# Patient Record
Sex: Male | Born: 1960 | Race: Black or African American | Hispanic: No | Marital: Married | Smoking: Never smoker
Health system: Southern US, Community
[De-identification: ages and names within clinical notes are randomized; demographics above are authoritative.]

## PROBLEM LIST (undated history)

## (undated) DIAGNOSIS — T7840XA Allergy, unspecified, initial encounter: Secondary | ICD-10-CM

## (undated) DIAGNOSIS — B1910 Unspecified viral hepatitis B without hepatic coma: Secondary | ICD-10-CM

## (undated) DIAGNOSIS — Z8619 Personal history of other infectious and parasitic diseases: Secondary | ICD-10-CM

## (undated) HISTORY — DX: Allergy, unspecified, initial encounter: T78.40XA

## (undated) HISTORY — DX: Personal history of other infectious and parasitic diseases: Z86.19

---

## 2007-04-12 ENCOUNTER — Ambulatory Visit: Payer: Self-pay | Admitting: Internal Medicine

## 2007-04-12 LAB — CONVERTED CEMR LAB
Albumin: 4.5 g/dL (ref 3.5–5.2)
CO2: 26 meq/L (ref 19–32)
Calcium: 8.8 mg/dL (ref 8.4–10.5)
Glucose, Bld: 89 mg/dL (ref 70–99)
HCT: 44.7 % (ref 39.0–52.0)
Hemoglobin: 14.7 g/dL (ref 13.0–17.0)
Potassium: 4 meq/L (ref 3.5–5.3)
RDW: 14.1 % — ABNORMAL HIGH (ref 11.5–14.0)
Sodium: 143 meq/L (ref 135–145)
Total Protein: 7.6 g/dL (ref 6.0–8.3)
WBC: 4.2 10*3/uL (ref 4.0–10.5)

## 2007-04-18 ENCOUNTER — Encounter (INDEPENDENT_AMBULATORY_CARE_PROVIDER_SITE_OTHER): Payer: Self-pay | Admitting: Internal Medicine

## 2007-05-03 ENCOUNTER — Ambulatory Visit: Payer: Self-pay | Admitting: Internal Medicine

## 2007-11-14 ENCOUNTER — Ambulatory Visit: Payer: Self-pay | Admitting: Internal Medicine

## 2007-11-15 ENCOUNTER — Ambulatory Visit: Payer: Self-pay | Admitting: *Deleted

## 2008-10-03 ENCOUNTER — Ambulatory Visit: Payer: Self-pay | Admitting: Internal Medicine

## 2008-10-03 LAB — CONVERTED CEMR LAB
Potassium: 3.7 meq/L (ref 3.5–5.3)
Sodium: 141 meq/L (ref 135–145)
TSH: 0.853 microintl units/mL (ref 0.350–4.50)

## 2008-10-22 ENCOUNTER — Ambulatory Visit: Payer: Self-pay | Admitting: Internal Medicine

## 2008-10-22 LAB — CONVERTED CEMR LAB
CO2: 24 meq/L (ref 19–32)
Chloride: 105 meq/L (ref 96–112)
HDL: 45 mg/dL (ref >39)
LDL Cholesterol: 66 mg/dL (ref 0–99)
Microalb, Ur: 1.02 mg/dL (ref 0.00–1.89)
Sodium: 140 meq/L (ref 135–145)
Total CHOL/HDL Ratio: 3.4

## 2008-10-30 ENCOUNTER — Ambulatory Visit: Payer: Self-pay | Admitting: Internal Medicine

## 2008-11-07 ENCOUNTER — Ambulatory Visit: Payer: Self-pay | Admitting: Internal Medicine

## 2008-12-10 ENCOUNTER — Ambulatory Visit: Payer: Self-pay | Admitting: Family Medicine

## 2009-01-21 ENCOUNTER — Ambulatory Visit: Payer: Self-pay | Admitting: Internal Medicine

## 2009-04-24 ENCOUNTER — Ambulatory Visit: Payer: Self-pay | Admitting: Internal Medicine

## 2009-04-29 ENCOUNTER — Ambulatory Visit: Payer: Self-pay | Admitting: Internal Medicine

## 2009-05-23 ENCOUNTER — Telehealth (INDEPENDENT_AMBULATORY_CARE_PROVIDER_SITE_OTHER): Payer: Self-pay | Admitting: *Deleted

## 2009-05-27 ENCOUNTER — Ambulatory Visit: Payer: Self-pay | Admitting: Internal Medicine

## 2009-08-28 ENCOUNTER — Ambulatory Visit: Payer: Self-pay | Admitting: Internal Medicine

## 2009-11-26 ENCOUNTER — Ambulatory Visit: Payer: Self-pay | Admitting: Internal Medicine

## 2010-02-03 ENCOUNTER — Emergency Department (HOSPITAL_COMMUNITY): Admission: EM | Admit: 2010-02-03 | Discharge: 2010-02-03 | Payer: Self-pay | Admitting: Emergency Medicine

## 2011-06-18 ENCOUNTER — Emergency Department (HOSPITAL_COMMUNITY)
Admission: EM | Admit: 2011-06-18 | Discharge: 2011-06-19 | Disposition: A | Discharge status: Home / Self Care | Payer: BC Managed Care – PPO | Attending: Emergency Medicine | Admitting: Emergency Medicine

## 2011-06-18 DIAGNOSIS — Y9289 Other specified places as the place of occurrence of the external cause: Secondary | ICD-10-CM | POA: Insufficient documentation

## 2011-06-18 DIAGNOSIS — R071 Chest pain on breathing: Secondary | ICD-10-CM | POA: Insufficient documentation

## 2011-06-18 DIAGNOSIS — M542 Cervicalgia: Secondary | ICD-10-CM | POA: Insufficient documentation

## 2011-06-18 DIAGNOSIS — IMO0002 Reserved for concepts with insufficient information to code with codable children: Secondary | ICD-10-CM

## 2011-06-18 DIAGNOSIS — S0100XA Unspecified open wound of scalp, initial encounter: Secondary | ICD-10-CM | POA: Insufficient documentation

## 2011-06-18 DIAGNOSIS — T148XXA Other injury of unspecified body region, initial encounter: Secondary | ICD-10-CM

## 2011-06-18 DIAGNOSIS — E119 Type 2 diabetes mellitus without complications: Secondary | ICD-10-CM | POA: Insufficient documentation

## 2011-06-18 DIAGNOSIS — S0990XA Unspecified injury of head, initial encounter: Secondary | ICD-10-CM | POA: Insufficient documentation
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
## 2011-06-19 ENCOUNTER — Emergency Department (HOSPITAL_COMMUNITY): Payer: BC Managed Care – PPO

## 2011-06-19 ENCOUNTER — Encounter: Payer: Self-pay | Admitting: Emergency Medicine

## 2011-06-19 MED ORDER — IBUPROFEN 600 MG PO TABS: 600.0000 mg | ORAL_TABLET | Freq: Four times a day (QID) | ORAL | Status: AC | PRN

## 2011-06-19 MED ORDER — TETANUS-DIPHTH-ACELL PERTUSSIS 5-2.5-18.5 LF-MCG/0.5 IM SUSP
0.5000 mL | Freq: Once | INTRAMUSCULAR | Status: AC
  Administered 2011-06-19: 0.5 mL via INTRAMUSCULAR
  Filled 2011-06-19: qty 0.5

## 2011-06-19 MED ORDER — HYDROCODONE-ACETAMINOPHEN 5-325 MG PO TABS
1.0000 | ORAL_TABLET | Freq: Once | ORAL | Status: AC
  Administered 2011-06-19: 1 via ORAL
  Filled 2011-06-19: qty 1

## 2011-06-19 MED ORDER — BACITRACIN 500 UNIT/GM EX OINT
1.0000 "application " | TOPICAL_OINTMENT | Freq: Two times a day (BID) | CUTANEOUS | Status: DC
  Administered 2011-06-19: 1 via TOPICAL
  Filled 2011-06-19 (×3): qty 0.9

## 2011-06-19 MED ORDER — HYDROCODONE-ACETAMINOPHEN 5-500 MG PO TABS: 1.0000 | ORAL_TABLET | Freq: Four times a day (QID) | ORAL | Status: AC | PRN
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
## 2011-06-19 NOTE — ED Notes (Signed)
Patient stable.  Patient has bacitracin zinc applied to lac and dressing applied.  Patient stable upon discharge.  Discharged home to care of wife.

## 2011-06-29 ENCOUNTER — Emergency Department (HOSPITAL_COMMUNITY)
Admission: EM | Admit: 2011-06-29 | Discharge: 2011-06-29 | Disposition: A | Discharge status: Home / Self Care | Payer: BC Managed Care – PPO | Attending: Emergency Medicine | Admitting: Emergency Medicine

## 2011-06-29 ENCOUNTER — Encounter (HOSPITAL_COMMUNITY): Payer: Self-pay | Admitting: *Deleted

## 2011-06-29 DIAGNOSIS — Z4802 Encounter for removal of sutures: Secondary | ICD-10-CM | POA: Insufficient documentation

## 2011-06-29 NOTE — ED Notes (Signed)
Pt has one staple R side head. Placed 10 days ago. No redness, drainage, pain noted.

## 2011-06-29 NOTE — ED Provider Notes (Signed)
History     CSN: [id number] Arrival date & time: 06/29/2011  8:27 AM   None     Chief Complaint  Patient presents with  . Suture / Staple Removal   HPI Patient presents to the emergency room with complaint of needing a staple removed from his head 10 days ago. No signs of infection or other concerns.  Past Medical History  Diagnosis Date  . Diabetes mellitus     History reviewed. No pertinent past surgical history.  No family history on file.  History  Substance Use Topics  . Smoking status: Never Smoker   . Smokeless tobacco: Not on file  . Alcohol Use: No      Review of Systems  All other systems reviewed and are negative.    Allergies  Review of patient's allergies indicates no known allergies.  Home Medications   Current Outpatient Rx  Name Route Sig Dispense Refill  . FOLIC ACID 1 MG PO TABS Oral Take 1 mg by mouth daily.      Marland Kitchen HYDROCODONE-ACETAMINOPHEN 5-500 MG PO TABS Oral Take 1-2 tablets by mouth every 6 (six) hours as needed for pain. 15 tablet 0  . IBUPROFEN 600 MG PO TABS Oral Take 1 tablet (600 mg total) by mouth every 6 (six) hours as needed for pain. 30 tablet 0  . METFORMIN HCL 500 MG PO TABS Oral Take 500 mg by mouth 2 (two) times daily with a meal.      . VITAMIN A 7500 UNITS PO CAPS Oral Take 7,500 Units by mouth daily.      Marland Kitchen VITAMIN C 500 MG PO TABS Oral Take 500 mg by mouth daily.        BP 128/71  Pulse 70  Temp(Src) 98.6 F (37 C) (Oral)  Resp 14  SpO2 97%  Physical Exam  Nursing note and vitals reviewed. Constitutional: He is oriented to person, place, and time. He appears well-developed and well-nourished. No distress.  HENT:  Head: Normocephalic and atraumatic.       Staple to right side of head  Eyes: EOM are normal. Pupils are equal, round, and reactive to light.  Neck: Normal range of motion. Neck supple.  Cardiovascular: Normal rate and regular rhythm.  Exam reveals no gallop and no friction rub.   No murmur  heard. Pulmonary/Chest: Effort normal and breath sounds normal.  Abdominal: Soft. Bowel sounds are normal.  Musculoskeletal: Normal range of motion. He exhibits no edema and no tenderness.  Neurological: He is alert and oriented to person, place, and time. He displays normal reflexes. No cranial nerve deficit. He exhibits normal muscle tone.  Skin: Skin is warm and dry. No rash noted. He is not diaphoretic. No erythema. No pallor.  Psychiatric: He has a normal mood and affect. His behavior is normal. Judgment and thought content normal.    ED Course  Procedures (including critical care time)  Staple removed. No signs of infection. Advised of warning signs to return. Stated agreement and understanding.    MDM  Staple removal.        Demetrius Charity, PA 06/29/11 [phone]

## 2011-06-29 NOTE — ED Provider Notes (Signed)
Medical screening examination/treatment/procedure(s) were performed by non-physician practitioner and as supervising physician I was immediately available for consultation/collaboration.   Glynn Octave, MD 06/29/11 [phone]

## 2013-11-15 ENCOUNTER — Ambulatory Visit (INDEPENDENT_AMBULATORY_CARE_PROVIDER_SITE_OTHER): Payer: BC Managed Care – PPO | Admitting: Family Medicine

## 2013-11-15 ENCOUNTER — Encounter: Payer: Self-pay | Admitting: Family Medicine

## 2013-11-15 VITALS — BP 100/68 | Temp 98.9°F | Ht 70.0 in | Wt 174.0 lb

## 2013-11-15 DIAGNOSIS — Z9109 Other allergy status, other than to drugs and biological substances: Secondary | ICD-10-CM

## 2013-11-15 DIAGNOSIS — Z7189 Other specified counseling: Secondary | ICD-10-CM

## 2013-11-15 DIAGNOSIS — Z7689 Persons encountering health services in other specified circumstances: Secondary | ICD-10-CM

## 2013-11-15 DIAGNOSIS — R05 Cough: Secondary | ICD-10-CM

## 2013-11-15 DIAGNOSIS — L720 Epidermal cyst: Secondary | ICD-10-CM

## 2013-11-15 DIAGNOSIS — R0982 Postnasal drip: Secondary | ICD-10-CM

## 2013-11-15 DIAGNOSIS — B1910 Unspecified viral hepatitis B without hepatic coma: Secondary | ICD-10-CM

## 2013-11-15 DIAGNOSIS — L723 Sebaceous cyst: Secondary | ICD-10-CM

## 2013-11-15 DIAGNOSIS — E119 Type 2 diabetes mellitus without complications: Secondary | ICD-10-CM

## 2013-11-15 DIAGNOSIS — R059 Cough, unspecified: Secondary | ICD-10-CM

## 2013-11-15 MED ORDER — FLUTICASONE PROPIONATE 50 MCG/ACT NA SUSP: 2.0000 | Freq: Every day | NASAL | Status: DC

## 2013-11-15 NOTE — Progress Notes (Signed)
Chief Complaint  Patient presents with  . Establish Care  . Diabetes    bilateral foot pain   . Cough    HPI:  Christian Vance is here to establish care.    Has the following chronic problems and concerns today:  There are no active problems to display for this patient.  DM: -was on metformin in the past but stopped due great control with lifestyle changes -no foot pain or tingling but occ feels cold and he wants to check hgba1c to see if diabetes is ok -denies: polyuria or polydipsia, CP, SOB, abd pain, fatigue  Cough/PND: -hx of possible allergies -no hx of hemoptysis, SOB, fever, sneezing, GERD -tried robitussin, Claritin sometime helps when pollen is bad  Bump on chest: -for about 1 year -not painful, not changing   ROS: See pertinent positives and negatives per HPI.  Past Medical History  Diagnosis Date  . Diabetes mellitus   . History of hepatitis B   . Allergy     History reviewed. No pertinent family history.  History   Social History  . Marital Status: Married    Spouse Name: N/A    Number of Children: N/A  . Years of Education: N/A   Social History Main Topics  . Smoking status: Never Smoker   . Smokeless tobacco: None  . Alcohol Use: No  . Drug Use: No  . Sexual Activity: None   Other Topics Concern  . 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      Spiritual Beliefs:      Lifestyle: no regular exercise; diet is healthy             Current outpatient prescriptions:Multiple Vitamins-Minerals (CENTRUM ULTRA MENS PO), Take by mouth., Disp: , Rfl: ;  fluticasone (FLONASE) 50 MCG/ACT nasal spray, Place 2 sprays into both nostrils daily., Disp: 16 g, Rfl: 6  EXAM:  Filed Vitals:   11/15/13 1615  BP: 100/68  Temp: 98.9 F (37.2 C)    Body mass index is 24.97 kg/(m^2).  GENERAL: vitals reviewed and listed above, alert, oriented, appears well hydrated and in no  acute distress  HEENT: atraumatic, conjunttiva clear, no obvious abnormalities on inspection of external nose and ears, normal appearance of ear canals and TMs, clear nasal congestion, boggy turbinates, mild post oropharyngeal erythema with PND, no tonsillar edema or exudate, no sinus TTP  NECK: no obvious masses on inspection  LUNGS: clear to auscultation bilaterally, no wheezes, rales or rhonchi, good air movement  CV: HRRR, no peripheral edema  SKIN: 1.5 sm mobile subcutaneous nodule L chest with punctate  MS: moves all extremities without noticeable abnormality  PSYCH: pleasant and cooperative, no obvious depression or anxiety  ASSESSMENT AND PLAN:  Discussed the following assessment and plan:  DM (diabetes mellitus) - Plan: Lipid Panel, Hemoglobin A3F, Basic metabolic panel, Microalbumin/Creatinine Ratio, Urine  Hepatitis B - Plan: Hep B Surface Antibody, Hepatitis B core antibody, total, Hep B Surface Antigen  Environmental allergies - Plan: fluticasone (FLONASE) 50 MCG/ACT nasal spray  Cough  PND (post-nasal drip)  Encounter to establish care  Epidermoid cyst   -We reviewed the PMH, PSH, FH, SH, Meds and Allergies. -We provided refills for any medications we will prescribe as needed. -We addressed current concerns per orders and patient instructions. -We have asked for records for pertinent exams, studies, vaccines and notes from previous providers. -We have  advised patient to follow up per instructions below.   -Patient advised to return or notify a doctor immediately if symptoms worsen or persist or new concerns arise.  Patient Instructions  -We have ordered labs or studies at this visit. It can take up to 1-2 weeks for results and processing. We will contact you with instructions IF your results are abnormal. Normal results will be released to your [facility]. If you have not heard from [place] or can not find your results in [facility] in 2 weeks please contact our  office.  -PLEASE SIGN UP FOR MYCHART TODAY   We recommend the following healthy lifestyle measures: - eat a healthy diet consisting of lots of vegetables, fruits, beans, nuts, seeds, healthy meats such as white chicken and fish and whole grains.  - avoid fried foods, fast food, processed foods, sodas, red meet and other fattening foods.  - get a least 150 minutes of aerobic exercise per week.   Flonase daily  Claritin daily  See dermatologist for removal of cyst  Follow up in: 1 month      Lucretia Kern

## 2013-11-15 NOTE — Patient Instructions (Signed)
-  We have ordered labs or studies at this visit. It can take up to 1-2 weeks for results and processing. We will contact you with instructions IF your results are abnormal. Normal results will be released to your [facility]. If you have not heard from [place] or can not find your results in [facility] in 2 weeks please contact our office.  -PLEASE SIGN UP FOR MYCHART TODAY   We recommend the following healthy lifestyle measures: - eat a healthy diet consisting of lots of vegetables, fruits, beans, nuts, seeds, healthy meats such as white chicken and fish and whole grains.  - avoid fried foods, fast food, processed foods, sodas, red meet and other fattening foods.  - get a least 150 minutes of aerobic exercise per week.   Flonase daily  Claritin daily  See dermatologist for removal of cyst  Follow up in: 1 month

## 2013-11-16 LAB — LIPID PANEL
CHOL/HDL RATIO: 2
Cholesterol: 107 mg/dL (ref 0–200)
HDL: 50.3 mg/dL (ref >39.00)
LDL CALC: 39 mg/dL (ref 0–99)
Triglycerides: 87 mg/dL (ref 0.0–149.0)
VLDL: 17.4 mg/dL (ref 0.0–40.0)

## 2013-11-16 LAB — BASIC METABOLIC PANEL
BUN: 10 mg/dL (ref 6–23)
CALCIUM: 9.1 mg/dL (ref 8.4–10.5)
CO2: 29 meq/L (ref 19–32)
CREATININE: 0.8 mg/dL (ref 0.4–1.5)
Chloride: 109 mEq/L (ref 96–112)
GFR: 131 mL/min (ref >60.00)
Glucose, Bld: 122 mg/dL — ABNORMAL HIGH (ref 70–99)
Potassium: 4.1 mEq/L (ref 3.5–5.1)
Sodium: 141 mEq/L (ref 135–145)

## 2013-11-16 LAB — HEPATITIS B CORE ANTIBODY, TOTAL: Hep B Core Total Ab: REACTIVE — AB

## 2013-11-16 LAB — HEPATITIS B SURFACE ANTIGEN: Hepatitis B Surface Ag: POSITIVE — AB

## 2013-11-16 LAB — MICROALBUMIN / CREATININE URINE RATIO
CREATININE, U: 376.3 mg/dL
MICROALB UR: 2.7 mg/dL — AB (ref 0.0–1.9)
Microalb Creat Ratio: 0.7 mg/g (ref 0.0–30.0)

## 2013-11-16 LAB — HEPATITIS B SURFACE ANTIBODY,QUALITATIVE: Hep B S Ab: POSITIVE — AB

## 2013-11-16 LAB — HEMOGLOBIN A1C: HEMOGLOBIN A1C: 7.5 % — AB (ref 4.6–6.5)

## 2013-11-30 ENCOUNTER — Telehealth: Payer: Self-pay

## 2013-11-30 NOTE — Telephone Encounter (Signed)
Relevant patient education mailed to patient.  

## 2013-12-03 ENCOUNTER — Encounter: Payer: Self-pay | Admitting: Family Medicine

## 2013-12-03 ENCOUNTER — Ambulatory Visit (INDEPENDENT_AMBULATORY_CARE_PROVIDER_SITE_OTHER): Payer: BC Managed Care – PPO | Admitting: Family Medicine

## 2013-12-03 VITALS — BP 120/70 | HR 84 | Temp 97.3°F | Ht 70.0 in | Wt 174.2 lb

## 2013-12-03 DIAGNOSIS — E1165 Type 2 diabetes mellitus with hyperglycemia: Principal | ICD-10-CM

## 2013-12-03 DIAGNOSIS — B181 Chronic viral hepatitis B without delta-agent: Secondary | ICD-10-CM | POA: Insufficient documentation

## 2013-12-03 DIAGNOSIS — E1129 Type 2 diabetes mellitus with other diabetic kidney complication: Secondary | ICD-10-CM

## 2013-12-03 DIAGNOSIS — Z23 Encounter for immunization: Secondary | ICD-10-CM

## 2013-12-03 DIAGNOSIS — IMO0002 Reserved for concepts with insufficient information to code with codable children: Secondary | ICD-10-CM

## 2013-12-03 DIAGNOSIS — E119 Type 2 diabetes mellitus without complications: Secondary | ICD-10-CM | POA: Insufficient documentation

## 2013-12-03 DIAGNOSIS — B1910 Unspecified viral hepatitis B without hepatic coma: Secondary | ICD-10-CM

## 2013-12-03 MED ORDER — PNEUMOCOCCAL 13-VAL CONJ VACC IM SUSP: 0.5000 mL | INTRAMUSCULAR | Status: DC

## 2013-12-03 MED ORDER — HEPATITIS A VACCINE 1440 EL U/ML IM SUSP
1.0000 mL | Freq: Once | INTRAMUSCULAR | Status: DC
Start: 2013-12-03 — End: 2013-12-03

## 2013-12-03 NOTE — Patient Instructions (Signed)
-  We have ordered labs or studies at this visit. It can take up to 1-2 weeks for results and processing. We will contact you with instructions IF your results are abnormal. Normal results will be released to your [facility]. If you have not heard from [place] or can not find your results in [facility] in 2 weeks please contact our office.   For you diabetes: -we put a referral to the diabetes educator  -advise low carb diet and regular exercise  -yearly eye exam with an eye doctor  -will consider adding medication once labs result  -follow up in 3 months  For the hepatitis: -we put a referral to the liver doctor  -avoid alcohol and drugs that impact the liver (such as tylenol)

## 2013-12-03 NOTE — Progress Notes (Signed)
Pre visit review using our clinic review tool, if applicable. No additional management support is needed unless otherwise documented below in the visit note. 

## 2013-12-03 NOTE — Progress Notes (Signed)
No chief complaint on file.   HPI:  Follow up:  New patient to me at last visit with the following found on screening labs:  1)DM: -on medicaiton in the past, then diet controlled  2)Chronic Hepatitis B: -per lab results -reports told in the past in [place] but not told needed to do anything -feels fine, denies abd pain, weight loss, swelling, fevers  ROS: See pertinent positives and negatives per HPI.  Past Medical History  Diagnosis Date  . Diabetes mellitus   . History of hepatitis B   . Allergy     No past surgical history on file.  No family history on file.  History   Social History  . Marital Status: Married    Spouse Name: N/A    Number of Children: N/A  . Years of Education: N/A   Social History Main Topics  . Smoking status: Never Smoker   . Smokeless tobacco: None  . Alcohol Use: No  . Drug Use: No  . Sexual Activity: None   Other Topics Concern  . 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      Spiritual Beliefs:      Lifestyle: no regular exercise; diet is healthy             Current outpatient prescriptions:fluticasone (FLONASE) 50 MCG/ACT nasal spray, Place 2 sprays into both nostrils daily., Disp: 16 g, Rfl: 6;  Multiple Vitamins-Minerals (CENTRUM ULTRA MENS PO), Take by mouth., Disp: , Rfl:  Current facility-administered medications:hepatitis A virus (PF) vaccine (HAVRIX (PF)) 1440 EL U/ML injection 1,440 Units, 1 mL, Intramuscular, Once, Lucretia Kern, DO;  [START ON 12/04/2013] pneumococcal 13-valent conjugate vaccine (PREVNAR 13) injection 0.5 mL, 0.5 mL, Intramuscular, Tomorrow-1000, Lucretia Kern, DO  EXAM:  Filed Vitals:   12/03/13 1621  BP: 120/70  Pulse: 84  Temp: 97.3 F (36.3 C)    Body mass index is 25 kg/(m^2).  GENERAL: vitals reviewed and listed above, alert, oriented, appears well hydrated and in no acute distress  HEENT: atraumatic,  conjunttiva clear, no obvious abnormalities on inspection of external nose and ears  NECK: no obvious masses on inspection  LUNGS: clear to auscultation bilaterally, no wheezes, rales or rhonchi, good air movement  CV: HRRR, no peripheral edema  MS: moves all extremities without noticeable abnormality  PSYCH: pleasant and cooperative, no obvious depression or anxiety  ASSESSMENT AND PLAN:  Discussed the following assessment and plan:  DM (diabetes mellitus), type 2, uncontrolled, with renal complications - Plan: Amb Referral to Nutrition and Diabetic E  Hepatitis B - Plan: Hepatic Function Panel, Hepatitis B E Antigen, Hepatitis B DNA, qualitative, HIV Antibody, Hep C Antibody, Ambulatory referral to Gastroenterology  Need for prophylactic vaccination and inoculation against viral hepatitis - Plan: hepatitis A virus (PF) vaccine (HAVRIX (PF)) 1440 EL U/ML injection 1,440 Units  Need for prophylactic vaccination against Streptococcus pneumoniae (pneumococcus) - Plan: pneumococcal 13-valent conjugate vaccine (PREVNAR 13) injection 0.5 mL  -we discussed possible serious and likely etiologies, workup and treatment, treatment risks and return precautions - discussed hep b serology, implications, complication, pot tx  -after this discussion, Leonidas opted for referral to hepatologist -in interim will check HBeAg, HBV DNA, LFTs, HIV, Hep C  -advised Hep A, pneumococcal and Tdap vaccines -advised serologic testing for hep b in wife - she reports she was tested and vaccinated and declines -for diabetes opted for  diabetes/nutrition educator referral and consider metformin and acei -follow up advised with me in 3 months -of course, we advised Jayln  to return or notify a doctor immediately if symptoms worsen or persist or new concerns arise.  -advised against hepatotoxic medications and alcohol use -Patient advised to return or notify a doctor immediately if symptoms worsen or persist or new  concerns arise.  Patient Instructions  -We have ordered labs or studies at this visit. It can take up to 1-2 weeks for results and processing. We will contact you with instructions IF your results are abnormal. Normal results will be released to your [facility]. If you have not heard from [place] or can not find your results in [facility] in 2 weeks please contact our office.   For you diabetes: -we put a referral to the diabetes educator  -advise low carb diet and regular exercise  -yearly eye exam with an eye doctor  -will consider adding medication once labs result  -follow up in 3 months  For the hepatitis: -we put a referral to the liver doctor  -avoid alcohol and drugs that impact the liver (such as tylenol)           Lucretia Kern

## 2013-12-04 LAB — HEPATIC FUNCTION PANEL
ALBUMIN: 4.3 g/dL (ref 3.5–5.2)
ALK PHOS: 59 U/L (ref 39–117)
ALT: 33 U/L (ref 0–53)
AST: 24 U/L (ref 0–37)
Bilirubin, Direct: 0.1 mg/dL (ref 0.0–0.3)
TOTAL PROTEIN: 7.7 g/dL (ref 6.0–8.3)
Total Bilirubin: 0.4 mg/dL (ref 0.3–1.2)

## 2013-12-04 LAB — HIV ANTIBODY (ROUTINE TESTING W REFLEX): HIV 1&2 Ab, 4th Generation: NONREACTIVE

## 2013-12-04 LAB — HEPATITIS C ANTIBODY: HCV Ab: NEGATIVE

## 2013-12-05 LAB — HEPATITIS B E ANTIGEN: HEPATITIS BE ANTIGEN: NONREACTIVE

## 2013-12-06 ENCOUNTER — Encounter: Payer: Self-pay | Admitting: Dietician

## 2013-12-06 ENCOUNTER — Encounter: Payer: BC Managed Care – PPO | Attending: Family Medicine | Admitting: Dietician

## 2013-12-06 VITALS — Ht 70.0 in | Wt 177.9 lb

## 2013-12-06 DIAGNOSIS — E119 Type 2 diabetes mellitus without complications: Secondary | ICD-10-CM | POA: Insufficient documentation

## 2013-12-06 DIAGNOSIS — E1129 Type 2 diabetes mellitus with other diabetic kidney complication: Secondary | ICD-10-CM

## 2013-12-06 DIAGNOSIS — IMO0002 Reserved for concepts with insufficient information to code with codable children: Secondary | ICD-10-CM

## 2013-12-06 DIAGNOSIS — E1165 Type 2 diabetes mellitus with hyperglycemia: Secondary | ICD-10-CM

## 2013-12-06 DIAGNOSIS — Z713 Dietary counseling and surveillance: Secondary | ICD-10-CM | POA: Insufficient documentation

## 2013-12-06 LAB — HEPATITIS B DNA, QUALITATIVE: HEPATITIS B VIRUS DNA QUAL: DETECTED — AB

## 2013-12-06 NOTE — Progress Notes (Signed)
Appt start time: 1430 end time:  1530.  Assessment:  Patient was seen on  12/06/13 for individual diabetes education. Pt reports with decent DM control through diet alone for some time. However, he does very minimal physical activity.  Current HbA1c: 7.5  Preferred Learning Style:   No preference indicated   Learning Readiness:   Contemplating  MEDICATIONS:  See list.  DIETARY INTAKE: Usual eating pattern includes 2-3 meals and 0-1 snacks per day. Everyday foods include semolina, vegetables.  Avoided foods include sodas, most sweet foods.    24-hr recall:  B ( AM): none; coffee or tea, usually with flavored creamer Snk ( AM): none  L ( PM): semolina with meat and fish and vegetables (spinach, cassava leaves) Snk ( PM): fruit occasionally, cheese or cashews sometimes D ( PM): semolina, meat, vegetables Snk ( PM): none Beverages: coffee, tea, water, sparkling water, cashew milk, no sodas Has eliminated rice and bread. Pt reports somewhat irregular schedule, eats almost exclusively when he feels hungry.   Usual physical activity: minimal, no formal exercise, no activity from work   Progress Towards Goal(s):  In progress.   Nutritional Diagnosis:  NI-5.8.2 Excessive carbohydrate intake As related to low physical activity level, type 2 DM.  As evidenced by HgbA1c 7.5, activity recall.    Intervention:  Nutrition counseling provided.  Discussed diabetes disease process and treatment options.  Discussed physiology of diabetes and role of obesity on insulin resistance.  Encouraged moderate weight reduction to improve glucose levels.  Discussed role of medications and diet in glucose control  Provided education on macronutrients on glucose levels.  Provided education on carb counting, importance of regularly scheduled meals/snacks, and meal planning  Discussed effects of physical activity on glucose levels and long-term glucose control.  Recommended 150 minutes of physical  activity/week.  Reviewed patient medications.  Discussed role of medication on blood glucose and possible side effects  Discussed blood glucose monitoring and interpretation.  Discussed recommended target ranges and individual ranges.    Described short-term complications: hyper- and hypo-glycemia.  Discussed causes,symptoms, and treatment options.  Discussed prevention, detection, and treatment of long-term complications.  Discussed the role of prolonged elevated glucose levels on body systems.  Discussed role of stress on blood glucose levels and discussed strategies to manage psychosocial issues.  Discussed recommendations for long-term diabetes self-care.  Established checklist for medical, dental, and emotional self-care.  Teaching Method Utilized:  Visual Auditory  Handouts given during visit include:  Living Well with Diabetes  The Plate Method  Barriers to learning/adherence to lifestyle change: admitted lack of motivation for exercise  Diabetes self-care support plan:   [facility] support group  Pt states his wife is very active in helping him with his healthcare  RD emphasized simple concepts for DM care, mainly eating at least 3 meals with a protein food at minimum, using the Plate Method for lunch and dinner, and beginning exercise of his choice at 30 minutes daily.   Demonstrated degree of understanding via:  Teach Back   Monitoring/Evaluation:  Dietary intake, exercise, portion control, and body weight in 2 month(s).

## 2013-12-14 ENCOUNTER — Ambulatory Visit: Payer: BC Managed Care – PPO | Admitting: Family Medicine

## 2014-03-07 ENCOUNTER — Ambulatory Visit: Payer: BC Managed Care – PPO | Admitting: Family Medicine

## 2014-03-12 ENCOUNTER — Encounter: Payer: Self-pay | Admitting: Family Medicine

## 2014-03-12 ENCOUNTER — Telehealth: Payer: Self-pay | Admitting: *Deleted

## 2014-03-12 ENCOUNTER — Ambulatory Visit (INDEPENDENT_AMBULATORY_CARE_PROVIDER_SITE_OTHER): Payer: BC Managed Care – PPO | Admitting: Family Medicine

## 2014-03-12 VITALS — BP 100/72 | HR 91 | Temp 98.9°F | Ht 70.0 in | Wt 183.0 lb

## 2014-03-12 DIAGNOSIS — E1165 Type 2 diabetes mellitus with hyperglycemia: Principal | ICD-10-CM

## 2014-03-12 DIAGNOSIS — IMO0002 Reserved for concepts with insufficient information to code with codable children: Secondary | ICD-10-CM

## 2014-03-12 DIAGNOSIS — Z1159 Encounter for screening for other viral diseases: Secondary | ICD-10-CM

## 2014-03-12 DIAGNOSIS — E1129 Type 2 diabetes mellitus with other diabetic kidney complication: Secondary | ICD-10-CM

## 2014-03-12 DIAGNOSIS — Z23 Encounter for immunization: Secondary | ICD-10-CM

## 2014-03-12 NOTE — Telephone Encounter (Signed)
I called the [facility] to check the status of an appt for the pt in which referral was placed in April.  I  spoke with Mariann Laster and she stated they have not received any lab tests on the pt. I faxed all lab tests from 4/9 and 4/27 to [facility] at [phone] and she stated someone will contact the pt with an appt.

## 2014-03-12 NOTE — Addendum Note (Signed)
Addended by: Lucretia Kern on: 03/12/2014 04:07 PM   Modules accepted: Orders

## 2014-03-12 NOTE — Addendum Note (Signed)
Addended by: Agnes Lawrence on: 03/12/2014 05:07 PM   Modules accepted: Orders

## 2014-03-12 NOTE — Progress Notes (Signed)
No chief complaint on file.   HPI:  Follow up:  DM: -he opted for diet and exercise and saw nutritionist -reports: enjoyed visit with the nutritionist and is walking 30 minutes or biking on a regular basis and is working on the diet -denies: polyuria, polydipsia, wounds on feet -LDL <100  Chronic Hep B: -see notes from [facility] Vance 11/2013 -referred to hepatologist, Christian Vance -reports: the hepatology office never called him  ROS: See pertinent positives and negatives per HPI.  Past Medical History  Diagnosis Date  . Diabetes mellitus   . History of hepatitis B   . Allergy     No past surgical history on file.  No family history on file.  History   Social History  . Marital Status: Married    Spouse Name: N/A    Number of Children: N/A  . Years of Education: N/A   Social History Main Topics  . Smoking status: Never Smoker   . Smokeless tobacco: None  . Alcohol Use: No  . Drug Use: No  . Sexual Activity: None   Other Topics Concern  . 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  GENERAL: vitals reviewed and listed above, alert, oriented, appears well hydrated and in no acute distress  HEENT: atraumatic, conjunttiva clear, no obvious abnormalities on inspection of external nose and ears  NECK: no obvious masses on inspection  LUNGS: clear to auscultation bilaterally, no wheezes, rales or rhonchi, good air movement  CV: HRRR, no peripheral edema  MS:  moves all extremities without noticeable abnormality  PSYCH: pleasant and cooperative, no obvious depression or anxiety  ASSESSMENT AND PLAN:  Discussed the following assessment and plan:  DM (diabetes mellitus), type 2, uncontrolled, with renal complications  Need for hepatitis B screening test  -pneumonia vaccine offered, done today -repeat hgba1c -advised my assistant to find out about hepatoloy referral and assist in setting up appointment, per referral notes [facility] office was to call him -Patient advised to return or notify a doctor immediately if symptoms worsen or persist or new concerns arise.  Patient Instructions   FOR YOUR DIABETES:  []  Eat a healthy low carb diet (avoid sweets, sweet drinks, breads, potatoes, rice, etc.) and ensure 3 small meals daily.  []  Get AT LEAST 150 minutes of cardiovascular exercise per week - 30 minutes per day is best of sustained sweaty exercise.  []  See an eye doctor every year and fax your diabetic eye exam to our office.  Fax: [phone]  []  Take good care of your feet and keep them soft and callus free. Check your feet daily and wear comfortable shoes. Use a good moisturizer such as cerave cream or aquaphor on your feet after washing them before bed. See your doctor immediately if you have any cuts, calluses or wounds on your feet.  [name] is  checking on the referral regarding the hepatitis. If you have not heard from my office or the specialist in 2 weeks please call our office.  -We have ordered labs or studies at this visit. It can take up to 1-2 weeks for results and processing. We will contact you with instructions IF your results are abnormal. Normal results will be released to your [facility]. If you have not heard from [place] or can not find your results in [facility] in 2 weeks please contact our office.            Christian Vance.

## 2014-03-12 NOTE — Progress Notes (Signed)
Pre visit review using our clinic review tool, if applicable. No additional management support is needed unless otherwise documented below in the visit note. 

## 2014-03-12 NOTE — Patient Instructions (Signed)
  FOR YOUR DIABETES:  []  Eat a healthy low carb diet (avoid sweets, sweet drinks, breads, potatoes, rice, etc.) and ensure 3 small meals daily.  []  Get AT LEAST 150 minutes of cardiovascular exercise per week - 30 minutes per day is best of sustained sweaty exercise.  []  See an eye doctor every year and fax your diabetic eye exam to our office.  Fax: [phone]  []  Take good care of your feet and keep them soft and callus free. Check your feet daily and wear comfortable shoes. Use a good moisturizer such as cerave cream or aquaphor on your feet after washing them before bed. See your doctor immediately if you have any cuts, calluses or wounds on your feet.  [name] is checking on the referral regarding the hepatitis. If you have not heard from my office or the specialist in 2 weeks please call our office.  -We have ordered labs or studies at this visit. It can take up to 1-2 weeks for results and processing. We will contact you with instructions IF your results are abnormal. Normal results will be released to your [facility]. If you have not heard from [place] or can not find your results in [facility] in 2 weeks please contact our office.

## 2014-03-13 LAB — BASIC METABOLIC PANEL
BUN: 15 mg/dL (ref 6–23)
CO2: 23 mEq/L (ref 19–32)
Calcium: 9 mg/dL (ref 8.4–10.5)
Chloride: 105 mEq/L (ref 96–112)
Creatinine, Ser: 0.9 mg/dL (ref 0.4–1.5)
GFR: 112.57 mL/min (ref >60.00)
GLUCOSE: 267 mg/dL — AB (ref 70–99)
POTASSIUM: 4.6 meq/L (ref 3.5–5.1)
SODIUM: 137 meq/L (ref 135–145)

## 2014-03-13 LAB — MICROALBUMIN / CREATININE URINE RATIO
Creatinine,U: 200.8 mg/dL
MICROALB UR: 0.4 mg/dL (ref 0.0–1.9)
Microalb Creat Ratio: 0.2 mg/g (ref 0.0–30.0)

## 2014-03-13 LAB — HEMOGLOBIN A1C: Hgb A1c MFr Bld: 7.5 % — ABNORMAL HIGH (ref 4.6–6.5)

## 2014-03-14 MED ORDER — METFORMIN HCL 500 MG PO TABS: 500.0000 mg | ORAL_TABLET | Freq: Two times a day (BID) | ORAL | Status: DC

## 2014-03-14 NOTE — Addendum Note (Signed)
Addended by: Agnes Lawrence on: 03/14/2014 01:25 PM   Modules accepted: Orders

## 2014-05-19 ENCOUNTER — Other Ambulatory Visit: Payer: Self-pay | Admitting: Family Medicine

## 2014-06-10 ENCOUNTER — Encounter: Payer: BC Managed Care – PPO | Admitting: Family Medicine

## 2014-06-10 DIAGNOSIS — Z0289 Encounter for other administrative examinations: Secondary | ICD-10-CM

## 2014-06-10 NOTE — Progress Notes (Signed)
Error-no show       This encounter was created in error - please disregard.

## 2014-08-01 ENCOUNTER — Other Ambulatory Visit: Payer: Self-pay | Admitting: Family Medicine

## 2014-09-03 ENCOUNTER — Encounter: Payer: Self-pay | Admitting: Family Medicine

## 2014-09-03 ENCOUNTER — Telehealth: Payer: Self-pay | Admitting: *Deleted

## 2014-09-03 ENCOUNTER — Ambulatory Visit (INDEPENDENT_AMBULATORY_CARE_PROVIDER_SITE_OTHER): Payer: BLUE CROSS/BLUE SHIELD | Admitting: Family Medicine

## 2014-09-03 VITALS — BP 110/80 | HR 77 | Temp 98.1°F | Ht 70.0 in | Wt 184.0 lb

## 2014-09-03 DIAGNOSIS — E1165 Type 2 diabetes mellitus with hyperglycemia: Secondary | ICD-10-CM

## 2014-09-03 DIAGNOSIS — IMO0002 Reserved for concepts with insufficient information to code with codable children: Secondary | ICD-10-CM

## 2014-09-03 DIAGNOSIS — B181 Chronic viral hepatitis B without delta-agent: Secondary | ICD-10-CM

## 2014-09-03 DIAGNOSIS — E1129 Type 2 diabetes mellitus with other diabetic kidney complication: Secondary | ICD-10-CM

## 2014-09-03 LAB — HEMOGLOBIN A1C: HEMOGLOBIN A1C: 6.5 % (ref 4.6–6.5)

## 2014-09-03 NOTE — Telephone Encounter (Signed)
I called [facility] at [phone] and spoke with Lexine Baton and she scheduled the pt an appt for 10/16/2014 at 9:45am and the pt was given this information and the phone number to call if he needs to reschedule.

## 2014-09-03 NOTE — Progress Notes (Signed)
HPI:  Follow up:  NOTE: note seen in some time - advised follow up in 3 month in august. Per my assistant did not return calls to hepatology office when referred. Here today with letter in french from his adoption lawyer in [place] that he needs a letter from me stating is current health situation. He wants this letter today.  DM: -complications: none -he opted for diet and exercise and saw nutritionist, then I advised metformin and close follow up - he has not been in since (5 months) -reports:  -denies: polyuria, polydipsia, wounds on feet -last eye exam: not this year -LDL <100  Chronic Hep B: -see notes from [place] 11/2013 -referred to hepatologist, [facility]; my assistant contaced [facility] to assist in scheduling appt -he prefers to see someone in [place] if possible -reports: the hepatology office never called him  Wants a letter regarding his general health for adoption.  ROS: See pertinent positives and negatives per HPI.  Past Medical History  Diagnosis Date  . Diabetes mellitus   . History of hepatitis B   . Allergy     No past surgical history on file.  No family history on file.  History   Social History  . Marital Status: Married    Spouse Name: N/A    Number of Children: N/A  . Years of Education: N/A   Social History Main Topics  . Smoking status: Never Smoker   . Smokeless tobacco: None  . Alcohol Use: No  . Drug Use: No  . Sexual Activity: None   Other Topics Concern  . 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  fluticasone (FLONASE) 50 MCG/ACT nasal spray, Place 2 sprays into both nostrils daily., Disp: 16 g, Rfl: 6 .  metFORMIN (GLUCOPHAGE) 500 MG tablet, TAKE 1 TABLET BY MOUTH TWICE DAILY WITH A MEAL, Disp: 60 tablet, Rfl: 0 .  Multiple  Vitamins-Minerals (CENTRUM ULTRA MENS PO), Take by mouth., Disp: , Rfl:   EXAM:  Filed Vitals:   09/03/14 1341  BP: 110/80  Pulse: 77  Temp: 98.1 F (36.7 C)    Body mass index is 26.4 kg/(m^2).  GENERAL: vitals reviewed and listed above, alert, oriented, appears well hydrated and in no acute distress  HEENT: atraumatic, conjunttiva clear, no obvious abnormalities on inspection of external nose and ears  NECK: no obvious masses on inspection  LUNGS: clear to auscultation bilaterally, no wheezes, rales or rhonchi, good air movement  CV: HRRR, no peripheral edema  MS: moves all extremities without noticeable abnormality  PSYCH: pleasant and cooperative, no obvious depression or anxiety  ASSESSMENT AND PLAN:  Discussed the following assessment and plan:  DM (diabetes mellitus), type 2, uncontrolled, with renal complications - Plan: Hemoglobin A1c  Hepatitis B, chronic - Plan: Basic metabolic panel  -FASTING labs -stressed importance of diet and exercise -follow up 3 months -see hepatologist for hep B - he says he was not contacted but referral coordinator reports he was and did not return calls, he wants to see hepatologist in [place] and I  have advised my assitant Wendie Simmer to assist him in getting appointment -will right letter regarding his general health per his request -  advised will write this asap and will include current conditions I am seeing him for. Advised this usually take 5-7 business days but will attempt to do asap. -Patient advised to return or notify a doctor immediately if symptoms worsen or persist or new concerns arise.  Patient Instructions  BEFORE YOU LEAVE: -labs  -We have ordered labs or studies at this visit. It can take up to 1-2 weeks for results and processing. We will contact you with instructions IF your results are abnormal. Normal results will be released to your [facility]. If you have not heard from [place] or can not find your results in  [facility] in 2 weeks please contact our office.  We recommend the following healthy lifestyle measures: - eat a healthy diet consisting of lots of vegetables, fruits, beans, nuts, seeds, healthy meats such as white chicken and fish and whole grains.  - avoid fried foods, fast food, processed foods, sodas, red meet and other fattening foods.  - get a least 150 minutes of aerobic exercise per week.       Christian Benton R.

## 2014-09-03 NOTE — Progress Notes (Signed)
Pre visit review using our clinic review tool, if applicable. No additional management support is needed unless otherwise documented below in the visit note. 

## 2014-09-03 NOTE — Patient Instructions (Addendum)
BEFORE YOU LEAVE: -labs  -We have ordered labs or studies at this visit. It can take up to 1-2 weeks for results and processing. We will contact you with instructions IF your results are abnormal. Normal results will be released to your [facility]. If you have not heard from [place] or can not find your results in [facility] in 2 weeks please contact our office.  We recommend the following healthy lifestyle measures: - eat a healthy diet consisting of lots of vegetables, fruits, beans, nuts, seeds, healthy meats such as white chicken and fish and whole grains.  - avoid fried foods, fast food, processed foods, sodas, red meet and other fattening foods.  - get a least 150 minutes of aerobic exercise per week.

## 2014-09-04 ENCOUNTER — Encounter: Payer: Self-pay | Admitting: *Deleted

## 2014-09-04 ENCOUNTER — Telehealth: Payer: Self-pay | Admitting: *Deleted

## 2014-09-04 LAB — BASIC METABOLIC PANEL
BUN: 12 mg/dL (ref 6–23)
CALCIUM: 9.7 mg/dL (ref 8.4–10.5)
CHLORIDE: 107 meq/L (ref 96–112)
CO2: 27 meq/L (ref 19–32)
CREATININE: 0.78 mg/dL (ref 0.40–1.50)
GFR: 132.55 mL/min (ref >60.00)
Glucose, Bld: 107 mg/dL — ABNORMAL HIGH (ref 70–99)
POTASSIUM: 4 meq/L (ref 3.5–5.1)
SODIUM: 139 meq/L (ref 135–145)

## 2014-09-04 NOTE — Telephone Encounter (Signed)
-----   Message from Lucretia Kern, DO sent at 09/03/2014  5:33 PM EST ----- Can you please type letter with the following and give the letter to Christian Vance ASAP. Thanks!  To whom it may concern,  Christian. Rodrick Payson has requested a letter stating his current general medical health. He has seen me on several occasions for his general medical care. He is a pleasant 54 yo male (DOB May 02, 2061) with a past medical history of mild diabetes, chronic hepatitis B and mild seasonal allergies. He seems to be in good physical health in general and is able to exercise without issues and has no other medical problems to my knowledge. Please feel free to contact me if you have any further questions regarding his health.  Sincerely,  Dr. Colin Benton, DO

## 2014-09-04 NOTE — Telephone Encounter (Signed)
I called the pt and informed him the note was left at the front desk for him to pick up.

## 2014-09-09 ENCOUNTER — Other Ambulatory Visit: Payer: Self-pay | Admitting: Family Medicine

## 2014-09-24 ENCOUNTER — Telehealth: Payer: Self-pay | Admitting: Family Medicine

## 2014-09-24 NOTE — Telephone Encounter (Signed)
Pt called to say he had labs in April 2015 and 09/03/14 and is asking for lab results from these 2 dates . Pt is requesting a call back today with results .

## 2014-09-24 NOTE — Telephone Encounter (Signed)
I called the pt and informed him copies of labs from both dates will be left at the front desk for him to pick up.

## 2014-10-01 ENCOUNTER — Other Ambulatory Visit: Payer: Self-pay | Admitting: Family Medicine

## 2014-10-17 ENCOUNTER — Other Ambulatory Visit (HOSPITAL_COMMUNITY): Payer: Self-pay | Admitting: Internal Medicine

## 2014-10-17 DIAGNOSIS — B1910 Unspecified viral hepatitis B without hepatic coma: Secondary | ICD-10-CM

## 2014-10-17 DIAGNOSIS — B181 Chronic viral hepatitis B without delta-agent: Secondary | ICD-10-CM

## 2014-10-22 ENCOUNTER — Other Ambulatory Visit: Payer: Self-pay | Admitting: Nurse Practitioner

## 2014-10-22 DIAGNOSIS — R772 Abnormality of alphafetoprotein: Secondary | ICD-10-CM

## 2014-10-29 ENCOUNTER — Ambulatory Visit (HOSPITAL_COMMUNITY)
Admission: RE | Admit: 2014-10-29 | Discharge: 2014-10-29 | Disposition: A | Discharge status: Home / Self Care | Payer: BLUE CROSS/BLUE SHIELD | Source: Ambulatory Visit | Attending: Internal Medicine | Admitting: Internal Medicine

## 2014-10-29 DIAGNOSIS — B181 Chronic viral hepatitis B without delta-agent: Secondary | ICD-10-CM

## 2014-10-29 DIAGNOSIS — B1910 Unspecified viral hepatitis B without hepatic coma: Secondary | ICD-10-CM

## 2014-11-06 ENCOUNTER — Inpatient Hospital Stay: Admission: RE | Admit: 2014-11-06 | Payer: BLUE CROSS/BLUE SHIELD | Source: Ambulatory Visit

## 2015-01-13 ENCOUNTER — Other Ambulatory Visit: Payer: Self-pay | Admitting: Family Medicine

## 2015-02-20 ENCOUNTER — Other Ambulatory Visit: Payer: Self-pay | Admitting: Family Medicine

## 2015-02-24 ENCOUNTER — Emergency Department (HOSPITAL_COMMUNITY): Payer: BLUE CROSS/BLUE SHIELD

## 2015-02-24 ENCOUNTER — Emergency Department (HOSPITAL_COMMUNITY)
Admission: EM | Admit: 2015-02-24 | Discharge: 2015-02-24 | Disposition: A | Discharge status: Home / Self Care | Payer: BLUE CROSS/BLUE SHIELD | Attending: Emergency Medicine | Admitting: Emergency Medicine

## 2015-02-24 ENCOUNTER — Encounter (HOSPITAL_COMMUNITY): Payer: Self-pay | Admitting: Emergency Medicine

## 2015-02-24 DIAGNOSIS — Z8619 Personal history of other infectious and parasitic diseases: Secondary | ICD-10-CM | POA: Diagnosis not present

## 2015-02-24 DIAGNOSIS — Z79899 Other long term (current) drug therapy: Secondary | ICD-10-CM | POA: Insufficient documentation

## 2015-02-24 DIAGNOSIS — Y9389 Activity, other specified: Secondary | ICD-10-CM | POA: Diagnosis not present

## 2015-02-24 DIAGNOSIS — E119 Type 2 diabetes mellitus without complications: Secondary | ICD-10-CM | POA: Insufficient documentation

## 2015-02-24 DIAGNOSIS — S92512A Displaced fracture of proximal phalanx of left lesser toe(s), initial encounter for closed fracture: Secondary | ICD-10-CM | POA: Diagnosis not present

## 2015-02-24 DIAGNOSIS — Y998 Other external cause status: Secondary | ICD-10-CM | POA: Diagnosis not present

## 2015-02-24 DIAGNOSIS — W228XXA Striking against or struck by other objects, initial encounter: Secondary | ICD-10-CM | POA: Insufficient documentation

## 2015-02-24 DIAGNOSIS — S99922A Unspecified injury of left foot, initial encounter: Secondary | ICD-10-CM | POA: Diagnosis present

## 2015-02-24 DIAGNOSIS — Y9289 Other specified places as the place of occurrence of the external cause: Secondary | ICD-10-CM | POA: Diagnosis not present

## 2015-02-24 DIAGNOSIS — Z7951 Long term (current) use of inhaled steroids: Secondary | ICD-10-CM | POA: Diagnosis not present

## 2015-02-24 DIAGNOSIS — S92502A Displaced unspecified fracture of left lesser toe(s), initial encounter for closed fracture: Secondary | ICD-10-CM

## 2015-02-24 MED ORDER — TRAMADOL HCL 50 MG PO TABS: 50.0000 mg | ORAL_TABLET | Freq: Four times a day (QID) | ORAL | Status: DC | PRN

## 2015-02-24 MED ORDER — IBUPROFEN 400 MG PO TABS
800.0000 mg | ORAL_TABLET | Freq: Once | ORAL | Status: AC
  Administered 2015-02-24: 800 mg via ORAL
  Filled 2015-02-24: qty 2

## 2015-02-24 MED ORDER — IBUPROFEN 800 MG PO TABS: 800.0000 mg | ORAL_TABLET | Freq: Three times a day (TID) | ORAL | Status: DC

## 2015-02-24 NOTE — ED Provider Notes (Signed)
CSN: [id number]     Arrival date & time 02/24/15  0804 History   First MD Initiated Contact with Patient 02/24/15 0825     Chief Complaint  Patient presents with  . Toe Pain     (Consider location/radiation/quality/duration/timing/severity/associated sxs/prior Treatment) HPI Comments: Patient reports accidentally hitting his left fifth toe on the couch yesterday. He reports sudden onset of throbbing and severe pain without radiation. No other injury.   Patient is a 54 y.o. male presenting with toe pain. The history is provided by the patient. No language interpreter was used.  Toe Pain This is a new problem. The current episode started yesterday. The problem occurs constantly. The problem has been unchanged. Associated symptoms include arthralgias and joint swelling. Pertinent negatives include no abdominal pain, chest pain, chills, fatigue, fever, nausea, neck pain, vomiting or weakness. The symptoms are aggravated by standing and walking. He has tried nothing for the symptoms. The treatment provided no relief.    Past Medical History  Diagnosis Date  . Diabetes mellitus   . History of hepatitis B   . Allergy    History reviewed. No pertinent past surgical history. No family history on file. History  Substance Use Topics  . Smoking status: Never Smoker   . Smokeless tobacco: Not on file  . Alcohol Use: No    Review of Systems  Constitutional: Negative for fever, chills and fatigue.  HENT: Negative for trouble swallowing.   Eyes: Negative for visual disturbance.  Respiratory: Negative for shortness of breath.   Cardiovascular: Negative for chest pain and palpitations.  Gastrointestinal: Negative for nausea, vomiting, abdominal pain and diarrhea.  Genitourinary: Negative for dysuria and difficulty urinating.  Musculoskeletal: Positive for joint swelling and arthralgias. Negative for neck pain.  Skin: Negative for color change.  Neurological: Negative for dizziness and  weakness.  Psychiatric/Behavioral: Negative for dysphoric mood.      Allergies  Review of patient's allergies indicates no known allergies.  Home Medications   Prior to Admission medications   Medication Sig Start Date End Date Taking? Authorizing Provider  fluticasone (FLONASE) 50 MCG/ACT nasal spray INSTILL 2 SPRAY IN [facility] NOSTRIL DAILY 10/01/14   Lucretia Kern, DO  metFORMIN (GLUCOPHAGE) 500 MG tablet TAKE 1 TABLET BY MOUTH TWICE DAILY WITH MEALS 02/21/15   Lucretia Kern, DO  Multiple Vitamins-Minerals (CENTRUM ULTRA MENS PO) Take by mouth.    Historical Provider, MD   BP 109/70 mmHg  Pulse 65  Temp(Src) 98 F (36.7 C) (Oral)  Resp 18  Ht 5\' 9"  (1.753 m)  Wt 181 lb (82.101 kg)  BMI 26.72 kg/m2  SpO2 99% Physical Exam  Constitutional: He is oriented to person, place, and time. He appears well-developed and well-nourished. No distress.  HENT:  Head: Normocephalic and atraumatic.  Eyes: Conjunctivae and EOM are normal.  Neck: Normal range of motion.  Cardiovascular: Normal rate and regular rhythm.  Exam reveals no gallop and no friction rub.   No murmur heard. Pulmonary/Chest: Effort normal and breath sounds normal. He has no wheezes. He has no rales. He exhibits no tenderness.  Abdominal: Soft. He exhibits no distension. There is no tenderness. There is no rebound.  Musculoskeletal: Normal range of motion.  Left fifth toe swelling and tenderness to palpation. No obvious deformity.   Neurological: He is alert and oriented to person, place, and time. Coordination normal.  Speech is goal-oriented. Moves limbs without ataxia.   Skin: Skin is warm and dry.  Psychiatric: He has a normal  mood and affect. His behavior is normal.  Nursing note and vitals reviewed.   ED Course  Procedures (including critical care time) Labs Review Labs Reviewed - No data to display  Imaging Review Dg Toe 5th Left  02/24/2015   CLINICAL DATA:  Patient hit toe on edge of table.  Pain  EXAM: DG  TOE 5TH LEFT  COMPARISON:  None.  FINDINGS: Frontal, oblique, and lateral views were obtained. There is a comminuted fracture through the midportion of the fifth proximal phalanx with lateral displacement of the distal major fracture fragment with respect proximal fragment. No other fractures. No dislocation. No appreciable joint space narrowing.  IMPRESSION: Comminuted and mildly displaced obliquely oriented fracture mid portion fifth proximal phalanx.   Electronically Signed   By: Lowella Grip III M.D.   On: 02/24/2015 08:55     EKG Interpretation None      MDM   Final diagnoses:  Fracture of fifth toe, left, closed, initial encounter    9:09 AM Patient will have buddy tape on toes and post op shoe. Patient will have ibuprofen here and be discharged with Tramadol for pain.    [address] [place], PA-C 02/24/15 7829  Daleen Bo, MD 02/24/15 [phone]

## 2015-02-24 NOTE — ED Notes (Signed)
Declined W/C at D/C and was escorted to lobby by RN. 

## 2015-02-24 NOTE — Discharge Instructions (Signed)
Take Tramadol and ibuprofen as needed for pain. Rest, ice and elevate your toe for swelling and pain.

## 2015-02-24 NOTE — ED Notes (Signed)
Patient states he hit is L little toe on the couch yesterday.  Patient complains of pain to same.  Denies other injury.

## 2015-02-25 ENCOUNTER — Telehealth: Payer: Self-pay | Admitting: Family Medicine

## 2015-02-25 NOTE — Telephone Encounter (Signed)
ok 

## 2015-02-25 NOTE — Telephone Encounter (Signed)
Pt went to [facility] yesterday and needs er follow up. Pt broke his toe. Can I create 30 min slot before end of july?

## 2015-02-27 NOTE — Telephone Encounter (Signed)
lmom at home # cell ringing fast busy

## 2015-02-28 NOTE — Telephone Encounter (Signed)
Lm on pt cell [phone]

## 2015-02-28 NOTE — Telephone Encounter (Signed)
lmom on home #

## 2015-03-03 NOTE — Telephone Encounter (Signed)
Pt has been scheduled.  °

## 2015-03-06 ENCOUNTER — Encounter: Payer: Self-pay | Admitting: Family Medicine

## 2015-03-06 ENCOUNTER — Ambulatory Visit (INDEPENDENT_AMBULATORY_CARE_PROVIDER_SITE_OTHER): Payer: BLUE CROSS/BLUE SHIELD | Admitting: Family Medicine

## 2015-03-06 VITALS — BP 130/80 | Temp 98.5°F | Wt 182.0 lb

## 2015-03-06 DIAGNOSIS — Z1211 Encounter for screening for malignant neoplasm of colon: Secondary | ICD-10-CM | POA: Diagnosis not present

## 2015-03-06 DIAGNOSIS — E1129 Type 2 diabetes mellitus with other diabetic kidney complication: Secondary | ICD-10-CM | POA: Diagnosis not present

## 2015-03-06 DIAGNOSIS — K759 Inflammatory liver disease, unspecified: Secondary | ICD-10-CM | POA: Diagnosis not present

## 2015-03-06 DIAGNOSIS — E1165 Type 2 diabetes mellitus with hyperglycemia: Secondary | ICD-10-CM

## 2015-03-06 DIAGNOSIS — S92912A Unspecified fracture of left toe(s), initial encounter for closed fracture: Secondary | ICD-10-CM

## 2015-03-06 DIAGNOSIS — IMO0002 Reserved for concepts with insufficient information to code with codable children: Secondary | ICD-10-CM

## 2015-03-06 LAB — MICROALBUMIN / CREATININE URINE RATIO
CREATININE, U: 145.2 mg/dL
Microalb Creat Ratio: 0.5 mg/g (ref 0.0–30.0)
Microalb, Ur: <0.7 mg/dL (ref 0.0–1.9)

## 2015-03-06 LAB — BASIC METABOLIC PANEL
BUN: 12 mg/dL (ref 6–23)
CHLORIDE: 106 meq/L (ref 96–112)
CO2: 29 mEq/L (ref 19–32)
Calcium: 9.6 mg/dL (ref 8.4–10.5)
Creatinine, Ser: 0.85 mg/dL (ref 0.40–1.50)
GFR: 119.82 mL/min (ref >60.00)
Glucose, Bld: 126 mg/dL — ABNORMAL HIGH (ref 70–99)
POTASSIUM: 4.2 meq/L (ref 3.5–5.1)
Sodium: 140 mEq/L (ref 135–145)

## 2015-03-06 LAB — HEMOGLOBIN A1C: HEMOGLOBIN A1C: 6.2 % (ref 4.6–6.5)

## 2015-03-06 MED ORDER — METFORMIN HCL 500 MG PO TABS: 500.0000 mg | ORAL_TABLET | Freq: Two times a day (BID) | ORAL | Status: DC

## 2015-03-06 NOTE — Patient Instructions (Addendum)
BEFORE YOU LEAVE: -labs for your diabetes -schedule follow up appointment in 3-4 months  Call today to schedule diabetic eye exam - we gave you the numbers for several eye doctors - it is very important to do this every year to protect your eye sight  Please call to schedule an appointment with your liver doctor - it is very important that you follow up with your liver doctor on a regular basis  -We placed a referral for you as discussed for your colonoscopy for colon cancer screening. It usually takes about 1-2 weeks to process and schedule this referral. If you have not heard from [place] regarding this appointment in 2 weeks please contact our office.  -please continue buddy taping and post op shoe for 2 more weeks, if no pain at that time can try regular shoe with stiff bottom for several more weeks then try gradual return to activities if no pain. Follow up if pain or problems.   We recommend the following healthy lifestyle measures: - eat a healthy diet consisting of lots of vegetables, fruits, beans, nuts, seeds, healthy meats such as white chicken and fish and whole grains.  - avoid fried foods, fast food, processed foods, sodas, red meet and other fattening foods.  - get a least 150 minutes of aerobic exercise per week.

## 2015-03-06 NOTE — Addendum Note (Signed)
Addended by: Agnes Lawrence on: 03/06/2015 09:38 AM   Modules accepted: Orders

## 2015-03-06 NOTE — Progress Notes (Signed)
HPI:   Expand All Collapse All    HPI:  Follow up:  L 5th toe fx: -hit on couch 7/18 -seen in ED and treated with buddy taping and tramadol -reports: he takes ibuprofen occ - but has not needed this recently - doing much better -denies: worsening, persistent swelling  DM: -complications: none -meds: metformin 500mg  bid -reports: doing well -denies: polyuria, polydipsia, wounds on feet -last eye exam: has not done this -LDL <100  Chronic Hep B: -referred to hepatologist -seen 10/2014 finally after we insisted -reports he has missed an appointment - he agrees to call and reschedule         ROS: See pertinent positives and negatives per HPI.  Past Medical History  Diagnosis Date  . Diabetes mellitus   . History of hepatitis B   . Allergy     No past surgical history on file.  No family history on file.  History   Social History  . Marital Status: Married    Spouse Name: N/A  . Number of Children: N/A  . Years of Education: N/A   Social History Main Topics  . Smoking status: Never Smoker   . Smokeless tobacco: Not on file  . Alcohol Use: No  . Drug Use: No  . Sexual Activity: Not on file   Other Topics Concern  . 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  fluticasone (FLONASE) 50 MCG/ACT nasal spray, INSTILL 2 SPRAY IN EACH NOSTRIL DAILY, Disp: 16 g, Rfl: 3 .  ibuprofen (ADVIL,MOTRIN) 800 MG tablet, Take 1 tablet (800 mg total) by mouth 3 (three) times daily., Disp: 21 tablet, Rfl: 0 .  metFORMIN (GLUCOPHAGE) 500 MG tablet, TAKE 1 TABLET BY MOUTH TWICE DAILY WITH MEALS, Disp: 60 tablet, Rfl: 0 .  Multiple Vitamins-Minerals (CENTRUM ULTRA MENS PO), Take by mouth., Disp: , Rfl:   EXAM:  Filed Vitals:   03/06/15 0905  BP: 130/80  Temp:  98.5 F (36.9 C)    Body mass index is 26.86 kg/(m^2).  GENERAL: vitals reviewed and listed above, alert, oriented, appears well hydrated and in no acute distress  HEENT: atraumatic, conjunttiva clear, no obvious abnormalities on inspection of external nose and ears  NECK: no obvious masses on inspection  LUNGS: clear to auscultation bilaterally, no wheezes, rales or rhonchi, good air movement  CV: HRRR, no peripheral edema  MS: moves all extremities without noticeable abnormality on inspection of toe of concern no swelling or redness or deformity, no TTP, minimal pain with ext, normal cap refill and movement  PSYCH: pleasant and cooperative, no obvious depression or anxiety  ASSESSMENT AND PLAN:  Discussed the following assessment and plan:  Toe fracture, left, closed, initial encounter  DM (diabetes mellitus), type 2, uncontrolled, with renal complications - Plan: Basic metabolic panel, Hemoglobin A1c, Microalbumin/Creatinine Ratio, Urine  Hepatitis  Colon cancer screening - Plan: Ambulatory referral to Gastroenterology  -toe seems to be healing exceptionally well based on exam findings and opted to continue buddy tapig and post op shoe per instructions -strongly stressed the importance of schedule his hepatolgy f/u and optho follow up and offered assistant with this - he has agreed to set this up -  advised colon cancer screening -Patient advised to return or notify a doctor immediately if symptoms worsen or persist or new concerns arise.  Patient Instructions  BEFORE YOU LEAVE: -labs for your diabetes -follow up appointment in 3-4 months  Call today to schedule diabetic eye exam - we gave you the numbers for several eye doctors - it is very important to do this every year to protect your eye sight  Please call to schedule an appointment with your liver doctor - it is very important that you follow up with your liver doctor on a regular basis  -We placed a referral for  you as discussed for your colonoscopy for colon cancer screening. It usually takes about 1-2 weeks to process and schedule this referral. If you have not heard from [place] regarding this appointment in 2 weeks please contact our office.  -please continue buddy taping and post op shoe for 2 more weeks, if no pain at that time can try regular shoe with stiff bottom for several more weeks then try gradual return to activities if no pain. Follow up if pain or problems.   We recommend the following healthy lifestyle measures: - eat a healthy diet consisting of lots of vegetables, fruits, beans, nuts, seeds, healthy meats such as white chicken and fish and whole grains.  - avoid fried foods, fast food, processed foods, sodas, red meet and other fattening foods.  - get a least 150 minutes of aerobic exercise per week.       Colin Benton R.

## 2015-03-06 NOTE — Progress Notes (Signed)
Pre visit review using our clinic review tool, if applicable. No additional management support is needed unless otherwise documented below in the visit note. 

## 2015-05-08 ENCOUNTER — Other Ambulatory Visit: Payer: Self-pay | Admitting: Family Medicine

## 2015-07-07 ENCOUNTER — Ambulatory Visit (INDEPENDENT_AMBULATORY_CARE_PROVIDER_SITE_OTHER): Payer: Self-pay | Admitting: Family Medicine

## 2015-07-07 DIAGNOSIS — R69 Illness, unspecified: Secondary | ICD-10-CM

## 2015-07-07 NOTE — Progress Notes (Signed)
No show

## 2015-08-12 ENCOUNTER — Encounter: Payer: Self-pay | Admitting: Family Medicine

## 2015-08-12 ENCOUNTER — Ambulatory Visit (INDEPENDENT_AMBULATORY_CARE_PROVIDER_SITE_OTHER): Payer: BLUE CROSS/BLUE SHIELD | Admitting: Family Medicine

## 2015-08-12 VITALS — BP 102/78 | HR 74 | Temp 98.0°F | Ht 69.0 in | Wt 182.8 lb

## 2015-08-12 DIAGNOSIS — R059 Cough, unspecified: Secondary | ICD-10-CM

## 2015-08-12 DIAGNOSIS — R05 Cough: Secondary | ICD-10-CM

## 2015-08-12 DIAGNOSIS — J309 Allergic rhinitis, unspecified: Secondary | ICD-10-CM | POA: Diagnosis not present

## 2015-08-12 DIAGNOSIS — Z1211 Encounter for screening for malignant neoplasm of colon: Secondary | ICD-10-CM

## 2015-08-12 MED ORDER — BENZONATATE 100 MG PO CAPS: 100.0000 mg | ORAL_CAPSULE | Freq: Three times a day (TID) | ORAL | Status: DC | PRN

## 2015-08-12 NOTE — Progress Notes (Signed)
HPI:  Acute visit for:  Cough: -for 1 month -dry, some drainage -hx allergies - uses flonase 1-2 times per week for sneezing and sinus congestino -denies: fevers, malaise, SOB, chills, hemoptysis, GERD nausea  ROS: See pertinent positives and negatives per HPI.  Past Medical History  Diagnosis Date  . Diabetes mellitus   . History of hepatitis B   . Allergy     No past surgical history on file.  No family history on file.  Social History   Social History  . Marital Status: Married    Spouse Name: N/A  . Number of Children: N/A  . Years of Education: N/A   Social History Main Topics  . Smoking status: Never Smoker   . Smokeless tobacco: None  . Alcohol Use: No  . Drug Use: No  . Sexual Activity: Not Asked   Other Topics Concern  . 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  fluticasone (FLONASE) 50 MCG/ACT nasal spray, INSTILL 2 SPRAY IN EACH NOSTRIL DAILY, Disp: 16 g, Rfl: 3 .  ibuprofen (ADVIL,MOTRIN) 800 MG tablet, Take 1 tablet (800 mg total) by mouth 3 (three) times daily., Disp: 21 tablet, Rfl: 0 .  metFORMIN (GLUCOPHAGE) 500 MG tablet, Take 1 tablet (500 mg total) by mouth 2 (two) times daily with a meal., Disp: 180 tablet, Rfl: 1 .  Multiple Vitamins-Minerals (CENTRUM ULTRA MENS PO), Take by mouth., Disp: , Rfl:  .  benzonatate (TESSALON PERLES) 100 MG capsule, Take 1 capsule (100 mg total) by mouth 3 (three) times daily as needed., Disp: 20 capsule, Rfl: 0  EXAM:  Filed Vitals:   08/12/15 0900  BP: 102/78  Pulse: 74  Temp: 98 F (36.7 C)    Body mass index is 26.98 kg/(m^2).  GENERAL: vitals reviewed and listed above, alert, oriented, appears well hydrated and in no acute distress  HEENT: atraumatic, conjunttiva clear, no obvious  abnormalities on inspection of external nose and ears, normal appearance of ear canals and TMs, clear nasal congestion w/ boggy turbinates, mild erythema post pharynx with PND, no tonsillar edema or exudate, no sinus TTP  NECK: no obvious masses on inspection  LUNGS: clear to auscultation bilaterally, no wheezes, rales or rhonchi, good air movement  CV: HRRR, no peripheral edema  MS: moves all extremities without noticeable abnormality  PSYCH: pleasant and cooperative, no obvious depression or anxiety  ASSESSMENT AND PLAN:  Discussed the following assessment and plan:  Allergic rhinitis, unspecified allergic rhinitis type  Cough - Plan: DG Chest 2 View  Colon cancer screening - Plan: Ambulatory referral to Gastroenterology  -we discussed possible serious and likely etiologies, workup and treatment, treatment risks and return precautions - likely PND from [place] -after this discussion, Yousif opted for CXR given ongoing for 1 month, INS, tessalon -follow up advised in 1 month -HM reviewed -of course, we advised Ronald  to return or notify a doctor immediately if symptoms worsen or persist or new concerns arise.  .  -Patient advised to return or notify a doctor immediately if symptoms worsen or persist or new concerns arise.  Patient Instructions  BEFORE YOU LEAVE: -follow up appointment in 1 month for diabetes and cough -  xray sheet -please bring the name of your eye doctor to your next appointment  Please use the flonase 2 sprays each nostril every day  Use the cough medication (tessalon perles) as needed per instruction  Go get the chest xray  Call you liver doctor today to schedule and appointment.  -We placed a referral for you as discussed for your colonoscopy. It usually takes about 1-2 weeks to process and schedule this referral. If you have not heard from [place] regarding this appointment in 2 weeks please contact our office.      Christian Vance R.

## 2015-08-12 NOTE — Progress Notes (Signed)
Pre visit review using our clinic review tool, if applicable. No additional management support is needed unless otherwise documented below in the visit note. 

## 2015-08-12 NOTE — Patient Instructions (Signed)
BEFORE YOU LEAVE: -follow up appointment in 1 month for diabetes and cough -xray sheet -please bring the name of your eye doctor to your next appointment  Please use the flonase 2 sprays each nostril every day  Use the cough medication (tessalon perles) as needed per instruction  Go get the chest xray  Call you liver doctor today to schedule and appointment.  -We placed a referral for you as discussed for your colonoscopy. It usually takes about 1-2 weeks to process and schedule this referral. If you have not heard from [place] regarding this appointment in 2 weeks please contact our office.

## 2015-08-14 ENCOUNTER — Ambulatory Visit (INDEPENDENT_AMBULATORY_CARE_PROVIDER_SITE_OTHER)
Admission: RE | Admit: 2015-08-14 | Discharge: 2015-08-14 | Disposition: A | Discharge status: Home / Self Care | Payer: BLUE CROSS/BLUE SHIELD | Source: Ambulatory Visit | Attending: Family Medicine | Admitting: Family Medicine

## 2015-08-14 DIAGNOSIS — R05 Cough: Secondary | ICD-10-CM

## 2015-08-14 DIAGNOSIS — R059 Cough, unspecified: Secondary | ICD-10-CM

## 2015-08-20 ENCOUNTER — Other Ambulatory Visit: Payer: Self-pay | Admitting: Family Medicine

## 2015-09-25 ENCOUNTER — Other Ambulatory Visit: Payer: Self-pay | Admitting: Family Medicine

## 2016-02-04 ENCOUNTER — Other Ambulatory Visit: Payer: Self-pay | Admitting: Family Medicine

## 2016-03-03 ENCOUNTER — Encounter: Payer: Self-pay | Admitting: Gastroenterology

## 2016-03-10 ENCOUNTER — Ambulatory Visit: Payer: 59

## 2016-03-10 ENCOUNTER — Encounter: Payer: Self-pay | Admitting: Gastroenterology

## 2016-03-10 VITALS — Ht 69.0 in | Wt 175.0 lb

## 2016-03-10 DIAGNOSIS — Z1211 Encounter for screening for malignant neoplasm of colon: Secondary | ICD-10-CM

## 2016-03-10 MED ORDER — SUPREP BOWEL PREP KIT 17.5-3.13-1.6 GM/177ML PO SOLN: 1.0000 | Freq: Once | ORAL | 0 refills | Status: AC

## 2016-03-11 ENCOUNTER — Other Ambulatory Visit: Payer: Self-pay | Admitting: Family Medicine

## 2016-03-23 ENCOUNTER — Telehealth: Payer: Self-pay | Admitting: Gastroenterology

## 2016-03-23 NOTE — Telephone Encounter (Signed)
Patient didn't have a BM after drinking the prep by 10PM, advised to drink additional prep with Miralax and gatorade

## 2016-03-24 ENCOUNTER — Ambulatory Visit (AMBULATORY_SURGERY_CENTER): Payer: 59 | Admitting: Gastroenterology

## 2016-03-24 ENCOUNTER — Encounter: Payer: Self-pay | Admitting: Gastroenterology

## 2016-03-24 VITALS — BP 100/60 | HR 60 | Temp 97.8°F | Resp 16 | Ht 69.0 in | Wt 175.0 lb

## 2016-03-24 DIAGNOSIS — K635 Polyp of colon: Secondary | ICD-10-CM

## 2016-03-24 DIAGNOSIS — D125 Benign neoplasm of sigmoid colon: Secondary | ICD-10-CM

## 2016-03-24 DIAGNOSIS — D122 Benign neoplasm of ascending colon: Secondary | ICD-10-CM

## 2016-03-24 DIAGNOSIS — Z1211 Encounter for screening for malignant neoplasm of colon: Secondary | ICD-10-CM | POA: Diagnosis present

## 2016-03-24 MED ORDER — SODIUM CHLORIDE 0.9 % IV SOLN: 500.0000 mL | INTRAVENOUS | Status: DC

## 2016-03-24 NOTE — Op Note (Signed)
Christian Vance Patient Name: Osric Coupland Procedure Date: 03/24/2016 10:04 AM MRN: [id number] Endoscopist: Milus Banister , MD Age: 55 Referring MD:  Date of Birth: 04-24-1961 Gender: Male Account #: [id number] Procedure:                Colonoscopy Indications:              Screening for colorectal malignant neoplasm Medicines:                Monitored Anesthesia Care Procedure:                Pre-Anesthesia Assessment:                           - Prior to the procedure, a History and Physical                            was performed, and patient medications and                            allergies were reviewed. The patient's tolerance of                            previous anesthesia was also reviewed. The risks                            and benefits of the procedure and the sedation                            options and risks were discussed with the patient.                            All questions were answered, and informed consent                            was obtained. Prior Anticoagulants: The patient has                            taken no previous anticoagulant or antiplatelet                            agents. ASA Grade Assessment: II - A patient with                            mild systemic disease. After reviewing the risks                            and benefits, the patient was deemed in                            satisfactory condition to undergo the procedure.                           After obtaining informed consent, the colonoscope  was passed under direct vision. Throughout the                            procedure, the patient's blood pressure, pulse, and                            oxygen saturations were monitored continuously. The                            Model CF-HQ190L [id number]) scope was introduced                            through the anus and advanced to the the cecum,                            identified by  appendiceal orifice and ileocecal                            valve. The colonoscopy was performed without                            difficulty. The patient tolerated the procedure                            well. The quality of the bowel preparation was                            excellent. The ileocecal valve, appendiceal                            orifice, and rectum were photographed. Scope In: 10:10:04 AM Scope Out: 10:21:06 AM Scope Withdrawal Time: 0 hours 8 minutes 21 seconds  Total Procedure Duration: 0 hours 11 minutes 2 seconds  Findings:                 A 3 mm polyp was found in the sigmoid colon. The                            polyp was sessile. The polyp was removed with a                            cold snare. Resection and retrieval were complete.                           A 10 mm polyp was found in the sigmoid colon. The                            polyp was pedunculated. The polyp was removed with                            a hot snare. Resection and retrieval were complete.  The exam was otherwise without abnormality on                            direct and retroflexion views.                           External hemorrhoids were found during perianal                            exam. The hemorrhoids were medium-sized. Complications:            No immediate complications. Estimated blood loss:                            None. Estimated Blood Loss:     Estimated blood loss: none. Impression:               - One 3 mm polyp in the sigmoid colon, removed with                            a cold snare. Resected and retrieved.                           - One 10 mm polyp in the sigmoid colon, removed                            with a hot snare. Resected and retrieved.                           - Medium sized external hemorrhoids.                           - The examination was otherwise normal on direct                            and retroflexion  views. Recommendation:           - Patient has a contact number available for                            emergencies. The signs and symptoms of potential                            delayed complications were discussed with the                            patient. Return to normal activities tomorrow.                            Written discharge instructions were provided to the                            patient.                           - Resume previous diet.                           -  Continue present medications.                           You will receive a letter within 2-3 weeks with the                            pathology results and my final recommendations.                           If the polyp(s) is proven to be 'pre-cancerous' on                            pathology, you will need repeat colonoscopy in 3                            years. If the polyp(s) is NOT 'precancerous' on                            pathology then you should repeat colon cancer                            screening in 10 years with colonoscopy without need                            for colon cancer screening by any method prior to                            then (including stool testing). Milus Banister, MD 03/24/2016 10:24:24 AM This report has been signed electronically.

## 2016-03-24 NOTE — Progress Notes (Signed)
To recovery, report to Tyrell, RN, VSS. 

## 2016-03-24 NOTE — Patient Instructions (Signed)
YOU HAD AN ENDOSCOPIC PROCEDURE TODAY AT THE [facility]:   Refer to the procedure report that was given to you for any specific questions about what was found during the examination.  If the procedure report does not answer your questions, please call your gastroenterologist to clarify.  If you requested that your care partner not be given the details of your procedure findings, then the procedure report has been included in a sealed envelope for you to review at your convenience later.  YOU SHOULD EXPECT: Some feelings of bloating in the abdomen. Passage of more gas than usual.  Walking can help get rid of the air that was put into your GI tract during the procedure and reduce the bloating. If you had a lower endoscopy (such as a colonoscopy or flexible sigmoidoscopy) you may notice spotting of blood in your stool or on the toilet paper. If you underwent a bowel prep for your procedure, you may not have a normal bowel movement for a few days.  Please Note:  You might notice some irritation and congestion in your nose or some drainage.  This is from the oxygen used during your procedure.  There is no need for concern and it should clear up in a day or so.  SYMPTOMS TO REPORT IMMEDIATELY:   Following lower endoscopy (colonoscopy or flexible sigmoidoscopy):  Excessive amounts of blood in the stool  Significant tenderness or worsening of abdominal pains  Swelling of the abdomen that is new, acute  Fever of 100F or higher  For urgent or emergent issues, a gastroenterologist can be reached at any hour by calling [phone].   DIET:  We do recommend a small meal at first, but then you may proceed to your regular diet.  Drink plenty of fluids but you should avoid alcoholic beverages for 24 hours.  ACTIVITY:  You should plan to take it easy for the rest of today and you should NOT DRIVE or use heavy machinery until tomorrow (because of the sedation medicines used during the test).     FOLLOW UP: Our staff will call the number listed on your records the next business day following your procedure to check on you and address any questions or concerns that you may have regarding the information given to you following your procedure. If we do not reach you, we will leave a message.  However, if you are feeling well and you are not experiencing any problems, there is no need to return our call.  We will assume that you have returned to your regular daily activities without incident.  If any biopsies were taken you will be contacted by phone or by letter within the next 1-3 weeks.  Please call us at [phone] if you have not heard about the biopsies in 3 weeks.    SIGNATURES/CONFIDENTIALITY: You and/or your care partner have signed paperwork which will be entered into your electronic medical record.  These signatures attest to the fact that that the information above on your After Visit Summary has been reviewed and is understood.  Full responsibility of the confidentiality of this discharge information lies with you and/or your care-partner. 

## 2016-03-25 ENCOUNTER — Telehealth: Payer: Self-pay

## 2016-03-25 NOTE — Telephone Encounter (Signed)
Left message on machine.

## 2016-03-31 ENCOUNTER — Encounter: Payer: Self-pay | Admitting: Gastroenterology

## 2016-05-04 ENCOUNTER — Other Ambulatory Visit: Payer: Self-pay | Admitting: Family Medicine

## 2016-08-18 ENCOUNTER — Other Ambulatory Visit: Payer: Self-pay | Admitting: Family Medicine

## 2016-10-02 ENCOUNTER — Other Ambulatory Visit: Payer: Self-pay | Admitting: Family Medicine

## 2016-10-15 ENCOUNTER — Ambulatory Visit (INDEPENDENT_AMBULATORY_CARE_PROVIDER_SITE_OTHER): Payer: 59 | Admitting: Family Medicine

## 2016-10-15 ENCOUNTER — Encounter: Payer: Self-pay | Admitting: Family Medicine

## 2016-10-15 VITALS — BP 118/80 | HR 88 | Temp 98.2°F | Ht 69.0 in | Wt 178.2 lb

## 2016-10-15 DIAGNOSIS — Z23 Encounter for immunization: Secondary | ICD-10-CM

## 2016-10-15 DIAGNOSIS — R1011 Right upper quadrant pain: Secondary | ICD-10-CM | POA: Diagnosis not present

## 2016-10-15 DIAGNOSIS — M79672 Pain in left foot: Secondary | ICD-10-CM

## 2016-10-15 DIAGNOSIS — IMO0002 Reserved for concepts with insufficient information to code with codable children: Secondary | ICD-10-CM

## 2016-10-15 DIAGNOSIS — Z125 Encounter for screening for malignant neoplasm of prostate: Secondary | ICD-10-CM

## 2016-10-15 DIAGNOSIS — E1121 Type 2 diabetes mellitus with diabetic nephropathy: Secondary | ICD-10-CM

## 2016-10-15 DIAGNOSIS — K759 Inflammatory liver disease, unspecified: Secondary | ICD-10-CM | POA: Diagnosis not present

## 2016-10-15 DIAGNOSIS — M79671 Pain in right foot: Secondary | ICD-10-CM

## 2016-10-15 DIAGNOSIS — E1165 Type 2 diabetes mellitus with hyperglycemia: Secondary | ICD-10-CM

## 2016-10-15 LAB — CBC
HCT: 43.5 % (ref 39.0–52.0)
HEMOGLOBIN: 14.2 g/dL (ref 13.0–17.0)
MCHC: 32.6 g/dL (ref 30.0–36.0)
MCV: 96.9 fl (ref 78.0–100.0)
Platelets: 173 10*3/uL (ref 150.0–400.0)
RBC: 4.49 Mil/uL (ref 4.22–5.81)
RDW: 13.9 % (ref 11.5–15.5)
WBC: 4.1 10*3/uL (ref 4.0–10.5)

## 2016-10-15 LAB — COMPREHENSIVE METABOLIC PANEL
ALT: 11 U/L (ref 0–53)
AST: 13 U/L (ref 0–37)
Albumin: 4.3 g/dL (ref 3.5–5.2)
Alkaline Phosphatase: 96 U/L (ref 39–117)
BUN: 10 mg/dL (ref 6–23)
CO2: 27 meq/L (ref 19–32)
CREATININE: 0.82 mg/dL (ref 0.40–1.50)
Calcium: 9.6 mg/dL (ref 8.4–10.5)
Chloride: 103 mEq/L (ref 96–112)
GFR: 124.18 mL/min (ref >60.00)
GLUCOSE: 281 mg/dL — AB (ref 70–99)
Potassium: 4 mEq/L (ref 3.5–5.1)
SODIUM: 137 meq/L (ref 135–145)
Total Bilirubin: 0.3 mg/dL (ref 0.2–1.2)
Total Protein: 7.3 g/dL (ref 6.0–8.3)

## 2016-10-15 LAB — URINALYSIS, ROUTINE W REFLEX MICROSCOPIC
BILIRUBIN URINE: NEGATIVE
HGB URINE DIPSTICK: NEGATIVE
LEUKOCYTES UA: NEGATIVE
Nitrite: NEGATIVE
PH: 6 (ref 5.0–8.0)
RBC / HPF: NONE SEEN (ref 0–?)
Specific Gravity, Urine: 1.025 (ref 1.000–1.030)
TOTAL PROTEIN, URINE-UPE24: 30 — AB
Urobilinogen, UA: 1 (ref 0.0–1.0)
WBC, UA: NONE SEEN (ref 0–?)

## 2016-10-15 LAB — MICROALBUMIN / CREATININE URINE RATIO
CREATININE, U: 214.7 mg/dL
Microalb Creat Ratio: 0.6 mg/g (ref 0.0–30.0)
Microalb, Ur: 1.3 mg/dL (ref 0.0–1.9)

## 2016-10-15 LAB — HDL CHOLESTEROL: HDL: 47.5 mg/dL (ref >39.00)

## 2016-10-15 LAB — HEMOGLOBIN A1C: Hgb A1c MFr Bld: 7 % — ABNORMAL HIGH (ref 4.6–6.5)

## 2016-10-15 LAB — CHOLESTEROL, TOTAL: Cholesterol: 120 mg/dL (ref 0–200)

## 2016-10-15 LAB — PSA: PSA: 1.01 ng/mL (ref 0.10–4.00)

## 2016-10-15 NOTE — Progress Notes (Signed)
Pre visit review using our clinic review tool, if applicable. No additional management support is needed unless otherwise documented below in the visit note. 

## 2016-10-15 NOTE — Patient Instructions (Signed)
BEFORE YOU LEAVE: -appointment with liver specialist -labs -urine dip with reflex -follow up: 1 month for physical, memory screen  We have ordered labs or studies at this visit. It can take up to 1-2 weeks for results and processing. IF results require follow up or explanation, we will call you with instructions. Clinically stable results will be released to your [facility]. If you have not heard from [place] or cannot find your results in [facility] in 2 weeks please contact our office at [phone].  If you are not yet signed up for [facility], please consider signing up.

## 2016-10-15 NOTE — Progress Notes (Signed)
HPI:  Christian Vance is a pleasant 56 yo with a PMH significant for DM, Hep B, seasonal allergies and very poor compliance with care. Not seen here in over 1 year. Number of complaints today. Feet feel cold when drives in the winter - this has been going on for years. breif episode RUQ pain 1 week ago - resolved with milk. No fevers, malaise,  Vomiting, diarrhea, change in bowels, melena, hematochezia. No symptoms since.  Referred to hepatology in the past for Hep B. Went once, reports told to follow up but he didn't remember the name, so did not.  Wants referral to neurology for memory issues - wife reports has had this his whole life. Pt feels worse the last 5 years and is concerned for dementia. Misplaces things all the time. Feels has orgasm too fast - 2 minutes. Some increased urinary frequency for several years.  ROS: See pertinent positives and negatives per HPI.  Past Medical History:  Diagnosis Date  . Allergy   . Diabetes mellitus   . History of hepatitis B     No past surgical history on file.  Family History  Problem Relation Age of Onset  . Colon cancer Neg Hx     Social History   Social History  . Marital status: Married    Spouse name: N/A  . Number of children: N/A  . Years of education: N/A   Social History Main Topics  . Smoking status: Never Smoker  . Smokeless tobacco: Never Used  . Alcohol use No  . Drug use: No  . Sexual activity: Not Asked   Other Topics Concern  . 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  fluticasone (FLONASE) 50 MCG/ACT nasal spray, INSTILL 2 SPRAY IN EACH NOSTRIL DAILY, Disp: 16 g, Rfl: 3 .  ibuprofen (ADVIL,MOTRIN) 800 MG tablet, Take 1 tablet (800 mg total) by mouth 3 (three) times daily., Disp: 21 tablet, Rfl: 0 .   metFORMIN (GLUCOPHAGE) 500 MG tablet, TAKE 1 TABLET BY MOUTH TWICE DAILY WITH MEALS, Disp: 60 tablet, Rfl: 0 .  Multiple Vitamins-Minerals (CENTRUM ULTRA MENS PO), Take by mouth., Disp: , Rfl:   Current Facility-Administered Medications:  .  0.9 %  sodium chloride infusion, 500 mL, Intravenous, Continuous, Milus Banister, MD  EXAM:  Vitals:   10/15/16 1334  BP: 118/80  Pulse: 88  Temp: 98.2 F (36.8 C)    Body mass index is 26.32 kg/m.  GENERAL: vitals reviewed and listed above, alert, oriented, appears well hydrated and in no acute distress  HEENT: atraumatic, conjunttiva clear, no obvious abnormalities on inspection of external nose and ears  NECK: no obvious masses on inspection  LUNGS: clear to auscultation bilaterally, no wheezes, rales or rhonchi, good air movement  CV: HRRR, no peripheral edema, normal pedal pulses  ABD: BS+, soft, NTTP, no rebound or guarding  MS: moves all extremities without noticeable abnormality, feet appear normal, normal diabetic foot exam  PSYCH: pleasant and cooperative, no obvious depression or anxiety  ASSESSMENT AND PLAN:  Discussed the following assessment and plan:  Uncontrolled type 2 diabetes mellitus with diabetic nephropathy, without long-term current use of insulin ([place]) - Plan: Hemoglobin  A1c, HDL cholesterol, Microalbumin / creatinine urine ratio, Cholesterol, total  Hepatitis - Plan: CBC, Comprehensive metabolic panel  Pain in both feet  RUQ pain - Plan: Urinalysis with Reflex Microscopic  Prostate cancer screening - Plan: PSA  -patient showed up late for appt -he has a hx of poor compliance and has many things to address today -we will start with labs, getting him back in with liver specialist and physical in a few weeks -assistant contacted his liver specialist and seems he has been noncompliant and due to no show must call their clinic himself to schedule appt - my assistant provided him the number and contact  information for the clinic, he agrees to call -lifestyle recommendations -if any further abd pain and/or pending labs will get RUQ [place] in interim until he sees his GI specialist -Patient advised to return or notify a doctor immediately if symptoms worsen or persist or new concerns arise.  Patient Instructions  BEFORE YOU LEAVE: -appointment with liver specialist -labs -urine dip with reflex -follow up: 1 month for physical, memory screen  We have ordered labs or studies at this visit. It can take up to 1-2 weeks for results and processing. IF results require follow up or explanation, we will call you with instructions. Clinically stable results will be released to your [facility]. If you have not heard from [place] or cannot find your results in [facility] in 2 weeks please contact our office at [phone].  If you are not yet signed up for [facility], please consider signing up.           Colin Benton R., DO

## 2016-11-03 ENCOUNTER — Other Ambulatory Visit: Payer: Self-pay | Admitting: Family Medicine

## 2016-11-04 ENCOUNTER — Other Ambulatory Visit: Payer: Self-pay | Admitting: Nurse Practitioner

## 2016-11-04 ENCOUNTER — Telehealth: Payer: Self-pay | Admitting: Family Medicine

## 2016-11-04 ENCOUNTER — Other Ambulatory Visit: Payer: Self-pay | Admitting: *Deleted

## 2016-11-04 DIAGNOSIS — B181 Chronic viral hepatitis B without delta-agent: Secondary | ICD-10-CM

## 2016-11-04 MED ORDER — FLUTICASONE PROPIONATE 50 MCG/ACT NA SUSP: NASAL | 3 refills | Status: DC

## 2016-11-04 MED ORDER — METFORMIN HCL 1000 MG PO TABS: 1000.0000 mg | ORAL_TABLET | Freq: Two times a day (BID) | ORAL | 3 refills | Status: DC

## 2016-11-04 NOTE — Telephone Encounter (Signed)
Pt request refill  fluticasone (FLONASE) 50 MCG/ACT nasal spray  Also would like to know if there is something he can get for his runny eyes, an eye drop.  [facility] [phone] - [place], [place] AT [place] [address]

## 2016-11-04 NOTE — Telephone Encounter (Signed)
Rx done. 

## 2016-11-04 NOTE — Telephone Encounter (Signed)
I called the pt and informed him of the message below. 

## 2016-11-04 NOTE — Telephone Encounter (Signed)
Rx sent for Flonase.  Message sent to Dr Maudie Mercury regarding eye drops.

## 2016-11-04 NOTE — Telephone Encounter (Signed)
Ok to refill flonase. Advise compresses for eyes and follow up if worsening or persists.

## 2016-11-15 ENCOUNTER — Telehealth: Payer: Self-pay | Admitting: Family Medicine

## 2016-11-15 NOTE — Telephone Encounter (Signed)
° ° ° ° °  Pt said the below med is making him sleep a lot and having some dizzyness. Pt would like to go back on the 500mg . Pt is taking the 1000mg     metFORMIN (GLUCOPHAGE) 1000 MG tablet

## 2016-11-15 NOTE — Telephone Encounter (Signed)
neeps appt.

## 2016-11-16 ENCOUNTER — Encounter: Payer: Self-pay | Admitting: Family Medicine

## 2016-11-16 ENCOUNTER — Ambulatory Visit (INDEPENDENT_AMBULATORY_CARE_PROVIDER_SITE_OTHER): Payer: 59 | Admitting: Family Medicine

## 2016-11-16 VITALS — BP 100/72 | HR 81 | Temp 98.4°F | Ht 69.0 in | Wt 177.3 lb

## 2016-11-16 DIAGNOSIS — R5383 Other fatigue: Secondary | ICD-10-CM | POA: Diagnosis not present

## 2016-11-16 DIAGNOSIS — E1165 Type 2 diabetes mellitus with hyperglycemia: Secondary | ICD-10-CM

## 2016-11-16 DIAGNOSIS — R3911 Hesitancy of micturition: Secondary | ICD-10-CM | POA: Diagnosis not present

## 2016-11-16 DIAGNOSIS — E1121 Type 2 diabetes mellitus with diabetic nephropathy: Secondary | ICD-10-CM | POA: Diagnosis not present

## 2016-11-16 DIAGNOSIS — IMO0002 Reserved for concepts with insufficient information to code with codable children: Secondary | ICD-10-CM

## 2016-11-16 MED ORDER — METFORMIN HCL 500 MG PO TABS: 500.0000 mg | ORAL_TABLET | Freq: Two times a day (BID) | ORAL | 3 refills | Status: DC

## 2016-11-16 NOTE — Telephone Encounter (Signed)
I called the pt and scheduled an appt for today at 4:30pm. 

## 2016-11-16 NOTE — Progress Notes (Signed)
HPI:  Acute visit for:  Medications issue: -reports when increased metformin to 1000bid caused fatigue, reports had this issue in the past with metformin -back to 500bid the last 2 days and feels better -has increased exercise and wishes to try this instead of any medication changes for now as reports he had stopped exercising prior to last labs -no fevers, chills, SOB, change in bowels, weight loss  Urinary issue: -reports for 1 year some urinary urgency and hesitancy - he mentioned some chronic frequency a last month  -worsening recently  -now reports has to elevated testicles and penis to "release urine" -wishes to see urologist -denies hematuria, pain, fevers, malaise  ROS: See pertinent positives and negatives per HPI.  Past Medical History:  Diagnosis Date  . Allergy   . Diabetes mellitus   . History of hepatitis B     No past surgical history on file.  Family History  Problem Relation Age of Onset  . Colon cancer Neg Hx     Social History   Social History  . Marital status: Married    Spouse name: N/A  . Number of children: N/A  . Years of education: N/A   Social History Main Topics  . Smoking status: Never Smoker  . Smokeless tobacco: Never Used  . Alcohol use No  . Drug use: No  . Sexual activity: Not Asked   Other Topics Concern  . 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  fluticasone (FLONASE) 50 MCG/ACT nasal spray, INSTILL 2 SPRAY IN EACH NOSTRIL DAILY, Disp: 16 g, Rfl: 3 .  ibuprofen (ADVIL,MOTRIN) 800 MG tablet, Take 1 tablet (800 mg total) by mouth 3 (three) times daily., Disp: 21 tablet, Rfl: 0 .  metFORMIN (GLUCOPHAGE) 500 MG tablet, Take 1 tablet (500 mg total) by mouth 2 (two) times daily with a meal., Disp: 180 tablet, Rfl:  3 .  Multiple Vitamins-Minerals (CENTRUM ULTRA MENS PO), Take by mouth., Disp: , Rfl:   Current Facility-Administered Medications:  .  0.9 %  sodium chloride infusion, 500 mL, Intravenous, Continuous, Milus Banister, MD  EXAM:  Vitals:   11/16/16 1621  BP: 100/72  Pulse: 81  Temp: 98.4 F (36.9 C)    Body mass index is 26.18 kg/m.  GENERAL: vitals reviewed and listed above, alert, oriented, appears well hydrated and in no acute distress  HEENT: atraumatic, conjunttiva clear, no obvious abnormalities on inspection of external nose and ears  NECK: no obvious masses on inspection  LUNGS: clear to auscultation bilaterally, no wheezes, rales or rhonchi, good air movement  CV: HRRR, no peripheral edema  GU/DRE: refused today  MS: moves all extremities without noticeable abnormality  PSYCH: pleasant and cooperative, no obvious depression or anxiety  ASSESSMENT AND PLAN:  Discussed the following assessment and plan:  Uncontrolled type 2 diabetes mellitus with diabetic nephropathy, without long-term current use of insulin ([facility]) -will go back to metformin bid and congratulated on exercise and will plan to recheck labs in 3 months at CPE  Fatigue: -he feels certain is due to metformin and feels has resolved back on lower dose -advised to follow up promptly if this symptoms recurs  Urinary hesitancy -  Plan: Ambulatory referral to Urology -we discussed possible serious and likely etiologies, workup and treatment, treatment risks and return precautions - he studied the urological system in highschool and has been reading about this he requests a urology eval and refuses exam here today -referral placed -of course, we advised Hal[place]  to return or notify a doctor immediately if symptoms worsen or persist or new concerns arise.    Patient Instructions  BEFORE YOU LEAVE: -follow up: can keep physical as scheduled or reschedule for in 3 months per his preference  -We placed a  referral for you as discussed to the urologist. It usually takes about 1-2 weeks to process and schedule this referral. If you have not heard from [place] regarding this appointment in 2 weeks please contact our office.  -Continue to work on the increased exercise and take the metformin 500mg  twice daily.  -follow up sooner if any persistent symptoms.     Colin Benton R., DO

## 2016-11-16 NOTE — Progress Notes (Signed)
Pre visit review using our clinic review tool, if applicable. No additional management support is needed unless otherwise documented below in the visit note. 

## 2016-11-16 NOTE — Patient Instructions (Signed)
BEFORE YOU LEAVE: -follow up: can keep physical as scheduled or reschedule for in 3 months per his preference  -We placed a referral for you as discussed to the urologist. It usually takes about 1-2 weeks to process and schedule this referral. If you have not heard from [place] regarding this appointment in 2 weeks please contact our office.  -Continue to work on the increased exercise and take the metformin 500mg  twice daily.  -follow up sooner if any persistent symptoms.

## 2016-11-25 ENCOUNTER — Ambulatory Visit: Payer: 59 | Admitting: Family Medicine

## 2016-11-29 ENCOUNTER — Ambulatory Visit
Admission: RE | Admit: 2016-11-29 | Discharge: 2016-11-29 | Disposition: A | Discharge status: Home / Self Care | Payer: 59 | Source: Ambulatory Visit | Attending: Nurse Practitioner | Admitting: Nurse Practitioner

## 2016-11-29 ENCOUNTER — Other Ambulatory Visit: Payer: 59

## 2016-11-29 DIAGNOSIS — B181 Chronic viral hepatitis B without delta-agent: Secondary | ICD-10-CM

## 2017-02-23 NOTE — Progress Notes (Signed)
HPI:  Here for CPE: Due for diabetic eye exam and labs  -Concerns and/or follow up today:  PMH DM, Hep B (sees hepatology), Dysuria (referred to urologist), allergies and poor compliance. Reports he is doing well without any new complaints. He did not follow up on urology appointment but still has the same symptoms from his last appointment - wants number to call to schedule. Riding bike for 30 -60 minutes twice per week. Feels diet is ok - could be better. Fasting for labs.  -Vaccines: UTD  -sexual activity: yes, male partner, no new partners  -wants STI testing, Hep C screening (if born 67-1967): no  -FH colon or prstate ca: see FH Last colon cancer screening: 03/2016, polyps Last prostate ca screening: 09/1016 - ok  -Alcohol, Tobacco, drug use: see social history  Review of Systems - no fevers, unintentional weight loss, vision loss, hearing loss, chest pain, sob, hemoptysis, melena, hematochezia, hematuria, genital discharge, changing or concerning skin lesions, bleeding, bruising, loc, thoughts of self harm or SI  Past Medical History:  Diagnosis Date  . Allergy   . Diabetes mellitus   . History of hepatitis B     No past surgical history on file.  Family History  Problem Relation Age of Onset  . Colon cancer Neg Hx     Social History   Social History  . Marital status: Married    Spouse name: N/A  . Number of children: N/A  . Years of education: N/A   Social History Main Topics  . Smoking status: Never Smoker  . Smokeless tobacco: Never Used  . Alcohol use No  . Drug use: No  . Sexual activity: Not Asked   Other Topics Concern  . 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  fluticasone (FLONASE) 50 MCG/ACT nasal spray, INSTILL 2 SPRAY IN  EACH NOSTRIL DAILY, Disp: 16 g, Rfl: 3 .  ibuprofen (ADVIL,MOTRIN) 800 MG tablet, Take 1 tablet (800 mg total) by mouth 3 (three) times daily., Disp: 21 tablet, Rfl: 0 .  metFORMIN (GLUCOPHAGE) 500 MG tablet, Take 1 tablet (500 mg total) by mouth 2 (two) times daily with a meal., Disp: 180 tablet, Rfl: 3 .  Multiple Vitamins-Minerals (CENTRUM ULTRA MENS PO), Take by mouth., Disp: , Rfl:   Current Facility-Administered Medications:  .  0.9 %  sodium chloride infusion, 500 mL, Intravenous, Continuous, Milus Banister, MD  EXAM:  Vitals:   02/24/17 0812  BP: 102/70  Pulse: 68  Temp: 98.7 F (37.1 C)  TempSrc: Oral  Weight: 177 lb 6.4 oz (80.5 kg)  Height: 5' 10.5" (1.791 m)    Estimated body mass index is 25.09 kg/m as calculated from the following:   Height as of this encounter: 5' 10.5" (1.791 m).   Weight as of this encounter: 177 lb 6.4 oz (80.5 kg).  GENERAL: vitals reviewed and listed below, alert, oriented, appears well hydrated and in no acute distress  HEENT: head atraumatic, PERRLA, normal appearance of eyes, ears, nose and mouth. moist mucus membranes.  NECK: supple, no masses or lymphadenopathy  LUNGS: clear to auscultation bilaterally, no rales, rhonchi or wheeze  CV: HRRR, no peripheral edema or cyanosis,  normal pedal pulses  ABDOMEN: bowel sounds normal, soft, non tender to palpation, no masses, no rebound or guarding  GU: deferred - plans to see urologist  SKIN: no rash or abnormal lesions  MS: normal gait, moves all extremities normally  NEURO: normal gait, speech and thought processing grossly intact, muscle tone grossly intact throughout  PSYCH: normal affect, pleasant and cooperative  ASSESSMENT AND PLAN:  Discussed the following assessment and plan:  Encounter for preventive health examination  Uncontrolled type 2 diabetes mellitus with diabetic nephropathy, without long-term current use of insulin ([place]) - Plan: Basic metabolic panel,  Hemoglobin A1c, Lipid panel  Dysuria  Hepatitis B, chronic ([place]), Chronic  -assistant to provide pt number to call for urology office - advised him to call today for appt for eval stable chronic dysuria  -advised to call today to schedule diabetic eye exam  -Discussed and advised all [place] preventive services health task force level A and B recommendations for age, sex and risks.  --Advised at least 150 minutes of exercise per week and a healthy diet with avoidance of (less then 1 serving per week) processed foods, white starches, red meat, fast foods and sweets and consisting of: * 5-9 servings of fresh fruits and vegetables (not corn or potatoes) *nuts and seeds, beans *olives and olive oil *lean meats such as fish and white chicken  *whole grains  -fasting labs, studies and vaccines per orders this encounter   Patient advised to return to clinic immediately if symptoms worsen or persist or new concerns.  Patient Instructions  BEFORE YOU LEAVE: -follow up: 3 months -labs  Call today to schedule urology appointment regarding your urinary symptoms.  Call today to set up your diabetic eye exam.  Continue medications.  See your live specialist in September.  We have ordered labs or studies at this visit. It can take up to 1-2 weeks for results and processing. IF results require follow up or explanation, we will call you with instructions. Clinically stable results will be released to your [facility]. If you have not heard from [place] or cannot find your results in [facility] in 2 weeks please contact our office at [phone].  If you are not yet signed up for [facility], please consider signing up.   We recommend the following healthy lifestyle for LIFE: 1) Small portions.   Tip: eat off of a salad plate instead of a dinner plate.  Tip: It is ok to feel hungry after a meal - that likely means you ate an appropriate portion.  Tip: if you need more or a snack choose fruits, veggies and/or  a handful of nuts or seeds.  2) Eat a healthy clean diet.   TRY TO EAT: -at least 5-7 servings of low sugar vegetables per day (not corn, potatoes or bananas.) -berries are the best choice if you wish to eat fruit.   -lean meets (fish, chicken or [place] breasts) -vegan proteins for some meals - beans or tofu, whole grains, nuts and seeds -Replace bad fats with good fats - good fats include: fish, nuts and seeds, canola oil, olive oil -small amounts of low fat or non fat dairy -small amounts of100 % whole grains - check the lables  AVOID: -SUGAR, sweets, anything with added sugar, corn syrup or sweeteners -if you must have a sweetener, small amounts of stevia may be best -sweetened beverages -simple starches (rice, bread, potatoes, pasta, chips, etc - small amounts of 100% whole grains are ok) -red meat, pork, butter -  fried foods, fast food, processed food, excessive dairy, eggs and coconut.  3)Get at least 150 minutes of sweaty aerobic exercise per week.  4)Reduce stress - consider counseling, meditation and relaxation to balance other aspects of your life.   Health Maintenance, Male A healthy lifestyle and preventive care is important for your health and wellness. Ask your health care provider about what schedule of regular examinations is right for you. What should I know about weight and diet? Eat a Healthy Diet  Eat plenty of vegetables, fruits, whole grains, low-fat dairy products, and lean protein.  Do not eat a lot of foods high in solid fats, added sugars, or salt.  Maintain a Healthy Weight Regular exercise can help you achieve or maintain a healthy weight. You should:  Do at least 150 minutes of exercise each week. The exercise should increase your heart rate and make you sweat (moderate-intensity exercise).  Do strength-training exercises at least twice a week.  Watch Your Levels of Cholesterol and Blood Lipids  Have your blood tested for lipids and cholesterol  every 5 years starting at 58 years of age. If you are at high risk for heart disease, you should start having your blood tested when you are 58 years old. You may need to have your cholesterol levels checked more often if: ? Your lipid or cholesterol levels are high. ? You are older than 58 years of age. ? You are at high risk for heart disease.  What should I know about cancer screening? Many types of cancers can be detected early and may often be prevented. Lung Cancer  You should be screened every year for lung cancer if: ? You are a current smoker who has smoked for at least 30 years. ? You are a former smoker who has quit within the past 15 years.  Talk to your health care provider about your screening options, when you should start screening, and how often you should be screened.  Colorectal Cancer  Routine colorectal cancer screening usually begins at 58 years of age and should be repeated every 5-10 years until you are 58 years old. You may need to be screened more often if early forms of precancerous polyps or small growths are found. Your health care provider may recommend screening at an earlier age if you have risk factors for colon cancer.  Your health care provider may recommend using home test kits to check for hidden blood in the stool.  A small camera at the end of a tube can be used to examine your colon (sigmoidoscopy or colonoscopy). This checks for the earliest forms of colorectal cancer.  Prostate and Testicular Cancer  Depending on your age and overall health, your health care provider may do certain tests to screen for prostate and testicular cancer.  Talk to your health care provider about any symptoms or concerns you have about testicular or prostate cancer.  Skin Cancer  Check your skin from head to toe regularly.  Tell your health care provider about any new moles or changes in moles, especially if: ? There is a change in a mole's size, shape, or  color. ? You have a mole that is larger than a pencil eraser.  Always use sunscreen. Apply sunscreen liberally and repeat throughout the day.  Protect yourself by wearing long sleeves, pants, a wide-brimmed hat, and sunglasses when outside.  What should I know about heart disease, diabetes, and high blood pressure?  If you are 36-66 years of age, have  your blood pressure checked every 3-5 years. If you are 37 years of age or older, have your blood pressure checked every year. You should have your blood pressure measured twice-once when you are at a hospital or clinic, and once when you are not at a hospital or clinic. Record the average of the two measurements. To check your blood pressure when you are not at a hospital or clinic, you can use: ? An automated blood pressure machine at a pharmacy. ? A home blood pressure monitor.  Talk to your health care provider about your target blood pressure.  If you are between 23-25 years old, ask your health care provider if you should take aspirin to prevent heart disease.  Have regular diabetes screenings by checking your fasting blood sugar level. ? If you are at a normal weight and have a low risk for diabetes, have this test once every three years after the age of 8. ? If you are overweight and have a high risk for diabetes, consider being tested at a younger age or more often.  A one-time screening for abdominal aortic aneurysm (AAA) by ultrasound is recommended for men aged 24-75 years who are current or former smokers. What should I know about preventing infection? Hepatitis B If you have a higher risk for hepatitis B, you should be screened for this virus. Talk with your health care provider to find out if you are at risk for hepatitis B infection. Hepatitis C Blood testing is recommended for:  Everyone born from [birth year] through 1967.  Anyone with known risk factors for hepatitis C.  Sexually Transmitted Diseases (STDs)  You should be  screened each year for STDs including gonorrhea and chlamydia if: ? You are sexually active and are younger than 58 years of age. ? You are older than 58 years of age and your health care provider tells you that you are at risk for this type of infection. ? Your sexual activity has changed since you were last screened and you are at an increased risk for chlamydia or gonorrhea. Ask your health care provider if you are at risk.  Talk with your health care provider about whether you are at high risk of being infected with HIV. Your health care provider may recommend a prescription medicine to help prevent HIV infection.  What else can I do?  Schedule regular health, dental, and eye exams.  Stay current with your vaccines (immunizations).  Do not use any tobacco products, such as cigarettes, chewing tobacco, and e-cigarettes. If you need help quitting, ask your health care provider.  Limit alcohol intake to no more than 2 drinks per day. One drink equals 12 ounces of beer, 5 ounces of wine, or 1 ounces of hard liquor.  Do not use street drugs.  Do not share needles.  Ask your health care provider for help if you need support or information about quitting drugs.  Tell your health care provider if you often feel depressed.  Tell your health care provider if you have ever been abused or do not feel safe at home. This information is not intended to replace advice given to you by your health care provider. Make sure you discuss any questions you have with your health care provider. Document Released: 01/22/2008 Document Revised: 03/24/2016 Document Reviewed: 04/29/2015 Elsevier Interactive Patient Education  2018 [employer].          No Follow-up on file.   Colin Benton R., DO

## 2017-02-24 ENCOUNTER — Ambulatory Visit (INDEPENDENT_AMBULATORY_CARE_PROVIDER_SITE_OTHER): Payer: 59 | Admitting: Family Medicine

## 2017-02-24 ENCOUNTER — Encounter: Payer: Self-pay | Admitting: Family Medicine

## 2017-02-24 VITALS — BP 102/70 | HR 68 | Temp 98.7°F | Ht 70.5 in | Wt 177.4 lb

## 2017-02-24 DIAGNOSIS — B181 Chronic viral hepatitis B without delta-agent: Secondary | ICD-10-CM | POA: Diagnosis not present

## 2017-02-24 DIAGNOSIS — IMO0002 Reserved for concepts with insufficient information to code with codable children: Secondary | ICD-10-CM

## 2017-02-24 DIAGNOSIS — E1121 Type 2 diabetes mellitus with diabetic nephropathy: Secondary | ICD-10-CM | POA: Diagnosis not present

## 2017-02-24 DIAGNOSIS — Z0000 Encounter for general adult medical examination without abnormal findings: Secondary | ICD-10-CM | POA: Diagnosis not present

## 2017-02-24 DIAGNOSIS — E1165 Type 2 diabetes mellitus with hyperglycemia: Secondary | ICD-10-CM

## 2017-02-24 DIAGNOSIS — R300 Dysuria: Secondary | ICD-10-CM | POA: Diagnosis not present

## 2017-02-24 LAB — LIPID PANEL
CHOL/HDL RATIO: 2
CHOLESTEROL: 118 mg/dL (ref 0–200)
HDL: 50.8 mg/dL (ref >39.00)
LDL CALC: 38 mg/dL (ref 0–99)
NonHDL: 66.82
Triglycerides: 146 mg/dL (ref 0.0–149.0)
VLDL: 29.2 mg/dL (ref 0.0–40.0)

## 2017-02-24 LAB — BASIC METABOLIC PANEL
BUN: 10 mg/dL (ref 6–23)
CALCIUM: 9.7 mg/dL (ref 8.4–10.5)
CHLORIDE: 106 meq/L (ref 96–112)
CO2: 27 meq/L (ref 19–32)
CREATININE: 0.87 mg/dL (ref 0.40–1.50)
GFR: 115.83 mL/min (ref >60.00)
GLUCOSE: 133 mg/dL — AB (ref 70–99)
Potassium: 3.9 mEq/L (ref 3.5–5.1)
Sodium: 140 mEq/L (ref 135–145)

## 2017-02-24 LAB — HEMOGLOBIN A1C: Hgb A1c MFr Bld: 7.1 % — ABNORMAL HIGH (ref 4.6–6.5)

## 2017-02-24 NOTE — Patient Instructions (Signed)
BEFORE YOU LEAVE: -follow up: 3 months -labs  Call today to schedule urology appointment regarding your urinary symptoms.  Call today to set up your diabetic eye exam.  Continue medications.  See your live specialist in September.  We have ordered labs or studies at this visit. It can take up to 1-2 weeks for results and processing. IF results require follow up or explanation, we will call you with instructions. Clinically stable results will be released to your [facility]. If you have not heard from [place] or cannot find your results in [facility] in 2 weeks please contact our office at [phone].  If you are not yet signed up for [facility], please consider signing up.   We recommend the following healthy lifestyle for LIFE: 1) Small portions.   Tip: eat off of a salad plate instead of a dinner plate.  Tip: It is ok to feel hungry after a meal - that likely means you ate an appropriate portion.  Tip: if you need more or a snack choose fruits, veggies and/or a handful of nuts or seeds.  2) Eat a healthy clean diet.   TRY TO EAT: -at least 5-7 servings of low sugar vegetables per day (not corn, potatoes or bananas.) -berries are the best choice if you wish to eat fruit.   -lean meets (fish, chicken or [place] breasts) -vegan proteins for some meals - beans or tofu, whole grains, nuts and seeds -Replace bad fats with good fats - good fats include: fish, nuts and seeds, canola oil, olive oil -small amounts of low fat or non fat dairy -small amounts of100 % whole grains - check the lables  AVOID: -SUGAR, sweets, anything with added sugar, corn syrup or sweeteners -if you must have a sweetener, small amounts of stevia may be best -sweetened beverages -simple starches (rice, bread, potatoes, pasta, chips, etc - small amounts of 100% whole grains are ok) -red meat, pork, butter -fried foods, fast food, processed food, excessive dairy, eggs and coconut.  3)Get at least 150 minutes of sweaty  aerobic exercise per week.  4)Reduce stress - consider counseling, meditation and relaxation to balance other aspects of your life.   Health Maintenance, Male A healthy lifestyle and preventive care is important for your health and wellness. Ask your health care provider about what schedule of regular examinations is right for you. What should I know about weight and diet? Eat a Healthy Diet  Eat plenty of vegetables, fruits, whole grains, low-fat dairy products, and lean protein.  Do not eat a lot of foods high in solid fats, added sugars, or salt.  Maintain a Healthy Weight Regular exercise can help you achieve or maintain a healthy weight. You should:  Do at least 150 minutes of exercise each week. The exercise should increase your heart rate and make you sweat (moderate-intensity exercise).  Do strength-training exercises at least twice a week.  Watch Your Levels of Cholesterol and Blood Lipids  Have your blood tested for lipids and cholesterol every 5 years starting at 58 years of age. If you are at high risk for heart disease, you should start having your blood tested when you are 58 years old. You may need to have your cholesterol levels checked more often if: ? Your lipid or cholesterol levels are high. ? You are older than 58 years of age. ? You are at high risk for heart disease.  What should I know about cancer screening? Many types of cancers can be detected early and  may often be prevented. Lung Cancer  You should be screened every year for lung cancer if: ? You are a current smoker who has smoked for at least 30 years. ? You are a former smoker who has quit within the past 15 years.  Talk to your health care provider about your screening options, when you should start screening, and how often you should be screened.  Colorectal Cancer  Routine colorectal cancer screening usually begins at 58 years of age and should be repeated every 5-10 years until you are 58  years old. You may need to be screened more often if early forms of precancerous polyps or small growths are found. Your health care provider may recommend screening at an earlier age if you have risk factors for colon cancer.  Your health care provider may recommend using home test kits to check for hidden blood in the stool.  A small camera at the end of a tube can be used to examine your colon (sigmoidoscopy or colonoscopy). This checks for the earliest forms of colorectal cancer.  Prostate and Testicular Cancer  Depending on your age and overall health, your health care provider may do certain tests to screen for prostate and testicular cancer.  Talk to your health care provider about any symptoms or concerns you have about testicular or prostate cancer.  Skin Cancer  Check your skin from head to toe regularly.  Tell your health care provider about any new moles or changes in moles, especially if: ? There is a change in a mole's size, shape, or color. ? You have a mole that is larger than a pencil eraser.  Always use sunscreen. Apply sunscreen liberally and repeat throughout the day.  Protect yourself by wearing long sleeves, pants, a wide-brimmed hat, and sunglasses when outside.  What should I know about heart disease, diabetes, and high blood pressure?  If you are 33-60 years of age, have your blood pressure checked every 3-5 years. If you are 69 years of age or older, have your blood pressure checked every year. You should have your blood pressure measured twice-once when you are at a hospital or clinic, and once when you are not at a hospital or clinic. Record the average of the two measurements. To check your blood pressure when you are not at a hospital or clinic, you can use: ? An automated blood pressure machine at a pharmacy. ? A home blood pressure monitor.  Talk to your health care provider about your target blood pressure.  If you are between 41-25 years old, ask your  health care provider if you should take aspirin to prevent heart disease.  Have regular diabetes screenings by checking your fasting blood sugar level. ? If you are at a normal weight and have a low risk for diabetes, have this test once every three years after the age of 89. ? If you are overweight and have a high risk for diabetes, consider being tested at a younger age or more often.  A one-time screening for abdominal aortic aneurysm (AAA) by ultrasound is recommended for men aged 70-75 years who are current or former smokers. What should I know about preventing infection? Hepatitis B If you have a higher risk for hepatitis B, you should be screened for this virus. Talk with your health care provider to find out if you are at risk for hepatitis B infection. Hepatitis C Blood testing is recommended for:  Everyone born from [birth year] through 1967.  Anyone with  known risk factors for hepatitis C.  Sexually Transmitted Diseases (STDs)  You should be screened each year for STDs including gonorrhea and chlamydia if: ? You are sexually active and are younger than 58 years of age. ? You are older than 58 years of age and your health care provider tells you that you are at risk for this type of infection. ? Your sexual activity has changed since you were last screened and you are at an increased risk for chlamydia or gonorrhea. Ask your health care provider if you are at risk.  Talk with your health care provider about whether you are at high risk of being infected with HIV. Your health care provider may recommend a prescription medicine to help prevent HIV infection.  What else can I do?  Schedule regular health, dental, and eye exams.  Stay current with your vaccines (immunizations).  Do not use any tobacco products, such as cigarettes, chewing tobacco, and e-cigarettes. If you need help quitting, ask your health care provider.  Limit alcohol intake to no more than 2 drinks per day. One  drink equals 12 ounces of beer, 5 ounces of wine, or 1 ounces of hard liquor.  Do not use street drugs.  Do not share needles.  Ask your health care provider for help if you need support or information about quitting drugs.  Tell your health care provider if you often feel depressed.  Tell your health care provider if you have ever been abused or do not feel safe at home. This information is not intended to replace advice given to you by your health care provider. Make sure you discuss any questions you have with your health care provider. Document Released: 01/22/2008 Document Revised: 03/24/2016 Document Reviewed: 04/29/2015 Elsevier Interactive Patient Education  [employer].

## 2017-03-02 ENCOUNTER — Telehealth: Payer: Self-pay | Admitting: Family Medicine

## 2017-03-02 NOTE — Telephone Encounter (Signed)
° °  Pt would like a call back about some lamps he had last week    [phone]

## 2017-03-03 MED ORDER — METFORMIN HCL 1000 MG PO TABS: 1000.0000 mg | ORAL_TABLET | Freq: Two times a day (BID) | ORAL | 1 refills | Status: DC

## 2017-03-03 NOTE — Addendum Note (Signed)
Addended by: Agnes Lawrence on: 03/03/2017 10:49 AM   Modules accepted: Orders

## 2017-03-03 NOTE — Telephone Encounter (Signed)
See results note. 

## 2017-04-28 ENCOUNTER — Encounter: Payer: Self-pay | Admitting: Family Medicine

## 2017-05-30 ENCOUNTER — Ambulatory Visit: Payer: 59 | Admitting: Family Medicine

## 2017-05-30 DIAGNOSIS — E118 Type 2 diabetes mellitus with unspecified complications: Secondary | ICD-10-CM | POA: Diagnosis not present

## 2017-07-22 DIAGNOSIS — N401 Enlarged prostate with lower urinary tract symptoms: Secondary | ICD-10-CM | POA: Diagnosis not present

## 2017-07-22 DIAGNOSIS — R3911 Hesitancy of micturition: Secondary | ICD-10-CM | POA: Diagnosis not present

## 2017-07-22 DIAGNOSIS — R3912 Poor urinary stream: Secondary | ICD-10-CM | POA: Diagnosis not present

## 2017-08-18 ENCOUNTER — Encounter: Payer: Self-pay | Admitting: Family Medicine

## 2017-12-22 ENCOUNTER — Encounter: Payer: Self-pay | Admitting: Family Medicine

## 2017-12-22 ENCOUNTER — Ambulatory Visit: Payer: 59 | Admitting: Family Medicine

## 2017-12-22 VITALS — BP 100/68 | HR 83 | Temp 98.7°F | Ht 70.5 in | Wt 168.1 lb

## 2017-12-22 DIAGNOSIS — E1165 Type 2 diabetes mellitus with hyperglycemia: Secondary | ICD-10-CM

## 2017-12-22 DIAGNOSIS — E1129 Type 2 diabetes mellitus with other diabetic kidney complication: Secondary | ICD-10-CM

## 2017-12-22 DIAGNOSIS — R413 Other amnesia: Secondary | ICD-10-CM | POA: Diagnosis not present

## 2017-12-22 DIAGNOSIS — R51 Headache: Secondary | ICD-10-CM

## 2017-12-22 DIAGNOSIS — G8929 Other chronic pain: Secondary | ICD-10-CM

## 2017-12-22 DIAGNOSIS — IMO0002 Reserved for concepts with insufficient information to code with codable children: Secondary | ICD-10-CM

## 2017-12-22 NOTE — Progress Notes (Signed)
HPI:  Using dictation device. Unfortunately this device frequently misinterprets words/phrases.  Acute visit for "regular check up" and several concerns.  New concern of R occipitoparietal headaches: -reports almost daily for 5-6 months -sharp stabbing pains in the R occipitoparietal regions -OTC analgesics helps a little -memory concerns - misplaces keys, other things -no vision changes, speech changes, weakness, numbness, malaise, fevers, weight loss -wants neurology referral  Follow up diabetes, taking his metformin. Reports stable. Due for labs. Reports did his eye exam in the last year but can not remember whom he saw.   ROS: See pertinent positives and negatives per HPI.  Past Medical History:  Diagnosis Date  . Allergy   . Diabetes mellitus   . History of hepatitis B     History reviewed. No pertinent surgical history.  Family History  Problem Relation Age of Onset  . Colon cancer Neg Hx     SOCIAL HX: see hpi   Current Outpatient Medications:  .  fluticasone (FLONASE) 50 MCG/ACT nasal spray, INSTILL 2 SPRAY IN EACH NOSTRIL DAILY, Disp: 16 g, Rfl: 3 .  ibuprofen (ADVIL,MOTRIN) 800 MG tablet, Take 1 tablet (800 mg total) by mouth 3 (three) times daily., Disp: 21 tablet, Rfl: 0 .  metFORMIN (GLUCOPHAGE) 1000 MG tablet, Take 1 tablet (1,000 mg total) by mouth 2 (two) times daily with a meal., Disp: 180 tablet, Rfl: 1 .  Multiple Vitamins-Minerals (CENTRUM ULTRA MENS PO), Take by mouth., Disp: , Rfl:   Current Facility-Administered Medications:  .  0.9 %  sodium chloride infusion, 500 mL, Intravenous, Continuous, Milus Banister, MD  EXAM:  Vitals:   12/22/17 1550  BP: 100/68  Pulse: 83  Temp: 98.7 F (37.1 C)    Body mass index is 23.78 kg/m.  GENERAL: vitals reviewed and listed above, alert, oriented, appears well hydrated and in no acute distress  HEENT: atraumatic, conjunttiva clear, no obvious abnormalities on inspection of external nose and  ears  NECK: no obvious masses on inspection  LUNGS: clear to auscultation bilaterally, no wheezes, rales or rhonchi, good air movement  CV: HRRR, no peripheral edema  MS: moves all extremities without noticeable abnormality  PSYCH/NEURO: pleasant and cooperative, no obvious depression or anxiety, speech and thought processing grossly intact, finger to nose normal, CN II-XII grossly intact, prominent indents at occipitoparietal suture lines bilat. Normal minicog.  ASSESSMENT AND PLAN:  Discussed the following assessment and plan:  Chronic nonintractable headache, unspecified headache type - Plan: Ambulatory referral to Neurology  Memory loss - Plan: Ambulatory referral to Neurology  DM (diabetes mellitus), type 2, uncontrolled, with renal complications ([place]) - Plan: Basic metabolic panel, Microalbumin / creatinine urine ratio, Hemoglobin A1c  -we discussed possible serious and likely etiologies of the head pain, workup and treatment, treatment risks and return precautions - ? Neurogenic. After this discussion, Mostyn opted for evaluation with neurologist per his request for this and memory concerns. Referral placed. -labs per orders -lifestyle recs -advised assistant to obtain speciality ov notes -Patient advised to return or notify a doctor immediately if symptoms worsen or persist or new concerns arise.  Patient Instructions  BEFORE YOU LEAVE: -labs -Wendie Simmer, obtain hepatology and opthalmology notes from last visits -follow up: 3 months  -We placed a referral for you as discussed to the neurologist. It usually takes about 1-2 weeks to process and schedule this referral. If you have not heard from [place] regarding this appointment in 2 weeks please contact our office.  We have ordered labs  or studies at this visit. It can take up to 1-2 weeks for results and processing. IF results require follow up or explanation, we will call you with instructions. Clinically stable results will be  released to your [facility]. If you have not heard from [place] or cannot find your results in [facility] in 2 weeks please contact our office at [phone].  If you are not yet signed up for [facility], please consider signing up.          Lucretia Kern, DO

## 2017-12-22 NOTE — Patient Instructions (Signed)
BEFORE YOU LEAVE: -labs -Wendie Simmer, obtain hepatology and opthalmology notes from last visits -follow up: 3 months  -We placed a referral for you as discussed to the neurologist. It usually takes about 1-2 weeks to process and schedule this referral. If you have not heard from [place] regarding this appointment in 2 weeks please contact our office.  We have ordered labs or studies at this visit. It can take up to 1-2 weeks for results and processing. IF results require follow up or explanation, we will call you with instructions. Clinically stable results will be released to your [facility]. If you have not heard from [place] or cannot find your results in [facility] in 2 weeks please contact our office at [phone].  If you are not yet signed up for [facility], please consider signing up.

## 2017-12-23 LAB — BASIC METABOLIC PANEL
BUN: 11 mg/dL (ref 6–23)
CHLORIDE: 106 meq/L (ref 96–112)
CO2: 30 mEq/L (ref 19–32)
Calcium: 9.3 mg/dL (ref 8.4–10.5)
Creatinine, Ser: 0.88 mg/dL (ref 0.40–1.50)
GFR: 113.99 mL/min (ref >60.00)
Glucose, Bld: 129 mg/dL — ABNORMAL HIGH (ref 70–99)
Potassium: 4.3 mEq/L (ref 3.5–5.1)
Sodium: 142 mEq/L (ref 135–145)

## 2017-12-23 LAB — MICROALBUMIN / CREATININE URINE RATIO
CREATININE, U: 286.1 mg/dL
MICROALB UR: 1.2 mg/dL (ref 0.0–1.9)
Microalb Creat Ratio: 0.4 mg/g (ref 0.0–30.0)

## 2017-12-23 LAB — HEMOGLOBIN A1C: HEMOGLOBIN A1C: 6.2 % (ref 4.6–6.5)

## 2018-01-30 ENCOUNTER — Encounter: Payer: Self-pay | Admitting: Neurology

## 2018-03-23 ENCOUNTER — Encounter: Payer: Self-pay | Admitting: Family Medicine

## 2018-03-23 ENCOUNTER — Ambulatory Visit: Payer: 59 | Admitting: Family Medicine

## 2018-03-23 VITALS — BP 98/60 | HR 67 | Temp 97.8°F | Ht 70.5 in | Wt 167.4 lb

## 2018-03-23 DIAGNOSIS — R51 Headache: Secondary | ICD-10-CM

## 2018-03-23 DIAGNOSIS — E1129 Type 2 diabetes mellitus with other diabetic kidney complication: Secondary | ICD-10-CM | POA: Diagnosis not present

## 2018-03-23 DIAGNOSIS — B181 Chronic viral hepatitis B without delta-agent: Secondary | ICD-10-CM | POA: Diagnosis not present

## 2018-03-23 DIAGNOSIS — IMO0002 Reserved for concepts with insufficient information to code with codable children: Secondary | ICD-10-CM

## 2018-03-23 DIAGNOSIS — R413 Other amnesia: Secondary | ICD-10-CM | POA: Diagnosis not present

## 2018-03-23 DIAGNOSIS — R519 Headache, unspecified: Secondary | ICD-10-CM

## 2018-03-23 DIAGNOSIS — E1165 Type 2 diabetes mellitus with hyperglycemia: Secondary | ICD-10-CM

## 2018-03-23 NOTE — Progress Notes (Signed)
HPI:  Using dictation device. Unfortunately this device frequently misinterprets words/phrases.  Christian Vance is a pleasant 57 y.o. here for follow up. Chronic medical problems summarized below were reviewed for changes. History of poor compliance with follow up and recommendations.  We referred him to neurologist at his last visit per his preference and request, but per chart they contacted him a number of times and he did not return their calls.  Today he reports he has an appointment with Dr. [name] coming up later this month.  He reports his headaches have improved, but he continues to have problems with short-term memory.  Will misplace his keys, etc.  Denies weakness, numbness, vision changes, speech problems, anxiety or depression.   He sees a urologist at [facility] for his urinary problems and reports these have improved.  He sees a hepatologist at [place] [facility] for his history of chronic hepatitis B.  However, he reports he forgets whom he saw and does not have the number to call for follow-up.  He thinks he is past due for follow-up and requests that we provide him the contact information today.   Reports he tries to eat a low sugar diet.  Not getting any regular exercise.  Continues his metformin.  Reports he will schedule appointment for his diabetic eye exam.  Denies CP, SOB, DOE, treatment intolerance or new symptoms.  Diabetes -meds: metformin  R occipitoparietal headaches: -reported at visit 5/19 almost daily for 5-6 months - referred to neurology and offered imaging, normal minicog/neuro exam; will be seeing Dr. [name] later this month -sharp stabbing pains in the R occipitoparietal regions -now improved per his report -OTC analgesics helps a little -memory concerns - misplaces keys, other things -no vision changes, speech changes, weakness, numbness, malaise, fevers, weight loss  Chronic Urinary urgency and hesitancy: -Sees urology, [facility]  Chronic  hepatitis B: -sees hepatologist, [name]    ROS: See pertinent positives and negatives per HPI.  Past Medical History:  Diagnosis Date  . Allergy   . Diabetes mellitus   . History of hepatitis B     History reviewed. No pertinent surgical history.  Family History  Problem Relation Age of Onset  . Colon cancer Neg Hx     SOCIAL HX: see hpi   Current Outpatient Medications:  .  fluticasone (FLONASE) 50 MCG/ACT nasal spray, INSTILL 2 SPRAY IN EACH NOSTRIL DAILY, Disp: 16 g, Rfl: 3 .  ibuprofen (ADVIL,MOTRIN) 800 MG tablet, Take 1 tablet (800 mg total) by mouth 3 (three) times daily., Disp: 21 tablet, Rfl: 0 .  metFORMIN (GLUCOPHAGE) 1000 MG tablet, Take 1 tablet (1,000 mg total) by mouth 2 (two) times daily with a meal., Disp: 180 tablet, Rfl: 1 .  Multiple Vitamins-Minerals (CENTRUM ULTRA MENS PO), Take by mouth., Disp: , Rfl:   Current Facility-Administered Medications:  .  0.9 %  sodium chloride infusion, 500 mL, Intravenous, Continuous, Milus Banister, MD  EXAM:  Vitals:   03/23/18 1104  BP: 98/60  Pulse: 67  Temp: 97.8 F (36.6 C)    Body mass index is 23.68 kg/m.  GENERAL: vitals reviewed and listed above, alert, oriented, appears well hydrated and in no acute distress  HEENT: atraumatic, conjunttiva clear, no obvious abnormalities on inspection of external nose and ears  NECK: no obvious masses on inspection  LUNGS: clear to auscultation bilaterally, no wheezes, rales or rhonchi, good air movement  CV: HRRR, no peripheral edema  FOOT exam done - see chart  MS: moves all extremities without noticeable abnormality  PSYCH: pleasant and cooperative, no obvious depression or anxiety, speech and thought processing grossly intact, gait normal  ASSESSMENT AND PLAN:  Discussed the following assessment and plan:  DM (diabetes mellitus), type 2, uncontrolled, with renal complications ([facility])  Hepatitis B, chronic ([facility])  Frequent headaches  Poor  short term memory  -Continue metformin, recommended a healthy low sugar diet and regular aerobic exercise.  Foot exam done today.  Recommended diabetic eye exam, he agrees to schedule.  Recommended flu shot in October. -Am glad his headaches seem to be doing better, sounds like he will be seeing Dr. [name], neurologist later this month for this and also for his reported short-term memory deficits -Advised assistant to provide him with the contact information for hepatology clinic so that he can schedule follow-up -Labs next visit, 3 to 4 months, follow-up sooner as needed  Patient Instructions  BEFORE YOU LEAVE: -follow up: 3-4 months  Eat a healthy low sugar diet and get at least 150 minutes or aerobic exercise daily.  See the neurologist as planned.  Follow up with your liver specialist - call to schedule follow up. See below.  Avoid alcohol and Tylenol.  Recommend flu shot in October, call our office if you would like to schedule this year.   We recommend the following healthy lifestyle for LIFE: 1) Small portions. But, make sure to get regular (at least 3 per day), healthy meals and small healthy snacks if needed.  2) Eat a healthy clean diet.   TRY TO EAT: -at least 5-7 servings of low sugar, colorful, and nutrient rich vegetables per day (not corn, potatoes or bananas.) -berries are the best choice if you wish to eat fruit (only eat small amounts if trying to reduce weight)  -lean meets (fish, white meat of chicken or [place]) -vegan proteins for some meals - beans or tofu, whole grains, nuts and seeds -Replace bad fats with good fats - good fats include: fish, nuts and seeds, canola oil, olive oil -small amounts of low fat or non fat dairy -small amounts of100 % whole grains - check the lables -drink plenty of water  AVOID: -SUGAR, sweets, anything with added sugar, corn syrup or sweeteners - must read labels as even foods advertised as "healthy" often are loaded with  sugar -if you must have a sweetener, small amounts of stevia may be best -sweetened beverages and artificially sweetened beverages -simple starches (rice, bread, potatoes, pasta, chips, etc - small amounts of 100% whole grains are ok) -red meat, pork, butter -fried foods, fast food, processed food, excessive dairy, eggs and coconut.  3)Get at least 150 minutes of sweaty aerobic exercise per week.  4)Reduce stress - consider counseling, meditation and relaxation to balance other aspects of your life.    Lucretia Kern, DO

## 2018-03-23 NOTE — Patient Instructions (Addendum)
BEFORE YOU LEAVE: -follow up: 3-4 months  Eat a healthy low sugar diet and get at least 150 minutes or aerobic exercise daily.  See the neurologist as planned.  Follow up with your liver specialist - call to schedule follow up. See below.  Avoid alcohol and Tylenol.  Recommend flu shot in October, call our office if you would like to schedule this year.   We recommend the following healthy lifestyle for LIFE: 1) Small portions. But, make sure to get regular (at least 3 per day), healthy meals and small healthy snacks if needed.  2) Eat a healthy clean diet.   TRY TO EAT: -at least 5-7 servings of low sugar, colorful, and nutrient rich vegetables per day (not corn, potatoes or bananas.) -berries are the best choice if you wish to eat fruit (only eat small amounts if trying to reduce weight)  -lean meets (fish, white meat of chicken or [place]) -vegan proteins for some meals - beans or tofu, whole grains, nuts and seeds -Replace bad fats with good fats - good fats include: fish, nuts and seeds, canola oil, olive oil -small amounts of low fat or non fat dairy -small amounts of100 % whole grains - check the lables -drink plenty of water  AVOID: -SUGAR, sweets, anything with added sugar, corn syrup or sweeteners - must read labels as even foods advertised as "healthy" often are loaded with sugar -if you must have a sweetener, small amounts of stevia may be best -sweetened beverages and artificially sweetened beverages -simple starches (rice, bread, potatoes, pasta, chips, etc - small amounts of 100% whole grains are ok) -red meat, pork, butter -fried foods, fast food, processed food, excessive dairy, eggs and coconut.  3)Get at least 150 minutes of sweaty aerobic exercise per week.  4)Reduce stress - consider counseling, meditation and relaxation to balance other aspects of your life.

## 2018-04-03 ENCOUNTER — Other Ambulatory Visit: Payer: Self-pay | Admitting: Family Medicine
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
## 2018-04-06 ENCOUNTER — Encounter

## 2018-04-06 ENCOUNTER — Ambulatory Visit: Payer: 59 | Admitting: Neurology

## 2018-04-06 ENCOUNTER — Other Ambulatory Visit (INDEPENDENT_AMBULATORY_CARE_PROVIDER_SITE_OTHER): Payer: 59

## 2018-04-06 ENCOUNTER — Encounter: Payer: Self-pay | Admitting: Neurology

## 2018-04-06 VITALS — BP 106/70 | HR 69 | Ht 70.5 in | Wt 164.0 lb

## 2018-04-06 DIAGNOSIS — R6889 Other general symptoms and signs: Secondary | ICD-10-CM

## 2018-04-06 DIAGNOSIS — G44229 Chronic tension-type headache, not intractable: Secondary | ICD-10-CM | POA: Diagnosis not present

## 2018-04-06 DIAGNOSIS — R413 Other amnesia: Secondary | ICD-10-CM | POA: Diagnosis not present

## 2018-04-06 LAB — TSH: TSH: 1.54 u[IU]/mL (ref 0.35–4.50)

## 2018-04-06 LAB — VITAMIN B12: Vitamin B-12: 390 pg/mL (ref 211–911)

## 2018-04-06 NOTE — Patient Instructions (Addendum)
1.  To evaluate for memory problems, we will order neurocognitive testing.  I will also check B12 and TSH levels. 2.  If you wish to start a daily preventative medication for headache, contact me. 3.  Limit pain relievers to no more than 2 days out of week to prevent rebound headache 4.  Keep headache diary 5.  Follow up after neurocognitive testing  Your provider has requested that you have labwork completed today. Please go to [facility] ([id number]) on the second floor of this building before leaving the office today. You do not need to check in. If you are not called within 15 minutes please check with the front desk.

## 2018-04-18 ENCOUNTER — Encounter: Payer: Self-pay | Admitting: Psychology

## 2018-04-19 ENCOUNTER — Telehealth: Payer: Self-pay | Admitting: *Deleted

## 2018-04-19 NOTE — Telephone Encounter (Signed)
Patient given results and informed that Dr. [name] would like to do the neurocognitive testing.  New patient paperwork for Dr. Si Raider and appointment cards mailed.

## 2018-04-19 NOTE — Telephone Encounter (Signed)
-----   Message from Chester Holstein, LPN sent at 0/05/2724 11:35 AM EDT -----   ----- Message ----- From: Pieter Partridge, DO Sent: 04/07/2018   6:18 AM EDT To: Clois Comber, CMA  Labs normal.  I would proceed with neurocognitive testing

## 2018-04-19 NOTE — Telephone Encounter (Signed)
-----   Message from Chester Holstein, LPN sent at 5/80/9983 11:35 AM EDT -----   ----- Message ----- From: Pieter Partridge, DO Sent: 04/07/2018   6:18 AM EDT To: Clois Comber, CMA  Labs normal.  I would proceed with neurocognitive testing

## 2018-05-30 DIAGNOSIS — E118 Type 2 diabetes mellitus with unspecified complications: Secondary | ICD-10-CM | POA: Diagnosis not present

## 2018-05-30 LAB — HM DIABETES EYE EXAM

## 2018-06-06 ENCOUNTER — Encounter: Payer: Self-pay | Admitting: Family Medicine

## 2018-06-13 ENCOUNTER — Telehealth: Payer: Self-pay | Admitting: Neurology

## 2018-06-13 NOTE — Telephone Encounter (Signed)
Dr Bailar is leaving for a new job closer to home we have mailed a letter to the patient to inform them that we canceled all appointments with Dr Bailar. We thank you for the understanding °

## 2018-07-14 ENCOUNTER — Telehealth: Payer: Self-pay | Admitting: Neurology

## 2018-07-14 NOTE — Telephone Encounter (Signed)
Left msg for patient regarding letter (Dr.Bailar leaving). Explained we would be happy to refer patient to another neuropsychologist. Asked to please call us if wanting to proceed with referral.

## 2018-07-24 ENCOUNTER — Encounter: Payer: Self-pay | Admitting: Family Medicine

## 2018-07-24 ENCOUNTER — Ambulatory Visit: Payer: 59 | Admitting: Family Medicine

## 2018-07-24 VITALS — BP 110/70 | HR 95 | Temp 98.3°F | Ht 70.5 in | Wt 170.1 lb

## 2018-07-24 DIAGNOSIS — R413 Other amnesia: Secondary | ICD-10-CM

## 2018-07-24 DIAGNOSIS — E1129 Type 2 diabetes mellitus with other diabetic kidney complication: Secondary | ICD-10-CM | POA: Diagnosis not present

## 2018-07-24 DIAGNOSIS — B181 Chronic viral hepatitis B without delta-agent: Secondary | ICD-10-CM

## 2018-07-24 DIAGNOSIS — IMO0002 Reserved for concepts with insufficient information to code with codable children: Secondary | ICD-10-CM

## 2018-07-24 DIAGNOSIS — E1165 Type 2 diabetes mellitus with hyperglycemia: Secondary | ICD-10-CM

## 2018-07-24 LAB — BASIC METABOLIC PANEL
BUN: 10 mg/dL (ref 6–23)
CALCIUM: 9.8 mg/dL (ref 8.4–10.5)
CHLORIDE: 104 meq/L (ref 96–112)
CO2: 29 meq/L (ref 19–32)
CREATININE: 0.72 mg/dL (ref 0.40–1.50)
GFR: 143.4 mL/min (ref >60.00)
Glucose, Bld: 126 mg/dL — ABNORMAL HIGH (ref 70–99)
Potassium: 4.3 mEq/L (ref 3.5–5.1)
SODIUM: 139 meq/L (ref 135–145)

## 2018-07-24 LAB — HEMOGLOBIN A1C: HEMOGLOBIN A1C: 6 % (ref 4.6–6.5)

## 2018-07-24 NOTE — Progress Notes (Signed)
HPI:  Using dictation device. Unfortunately this device frequently misinterprets words/phrases.  Christian Vance is a pleasant 57 y.o. here for follow up. Chronic medical problems summarized below were reviewed for changes and stability and were updated as needed below. These issues and their treatment remain stable for the most part.  Reports doing well overall. Due for labs and agrees to flu shot today. Reports memory issues remain the same and report neurologist referred him for imaging and neuropsych testing but appt was canceled by the office. He has not called back. Per notes it looks like neuropsych provider retired but there was offer from neurology office to refer to a different provider. I don't see any imaging orders from what I can see. Reports has rare headaches. Denies CP, SOB, DOE, treatment intolerance or new symptoms.  Due for flu vaccine and labs  Diabetes -meds: metformin  R occipitoparietal headaches/cognitive concerns: -seeing neurology for managment -reported at visit 5/19 almost daily for 5-6 months - referred to neurology and offered imaging (declined here), normal minicog/neuro exam -sharp stabbing pains in the R occipitoparietal regions -now improved per his report -OTC analgesics helps a little -memory concerns - misplaces keys, other things - had eval with neurology for this and headaches in 2019 -no vision changes, speech changes, weakness, numbness, malaise, fevers, weight loss  Chronic Urinary urgency and hesitancy: -Sees urology, [facility]  Chronic hepatitis B: -sees hepatologist, [name]   ROS: See pertinent positives and negatives per HPI.  Past Medical History:  Diagnosis Date  . Allergy   . Diabetes mellitus   . History of hepatitis B     History reviewed. No pertinent surgical history.  Family History  Problem Relation Age of Onset  . Colon cancer Neg Hx     SOCIAL HX: see hpi   Current Outpatient Medications:  .   fluticasone (FLONASE) 50 MCG/ACT nasal spray, INSTILL 2 SPRAY IN EACH NOSTRIL DAILY, Disp: 16 g, Rfl: 3 .  ibuprofen (ADVIL,MOTRIN) 800 MG tablet, Take 1 tablet (800 mg total) by mouth 3 (three) times daily., Disp: 21 tablet, Rfl: 0 .  metFORMIN (GLUCOPHAGE) 1000 MG tablet, TAKE 1 TABLET(1000 MG) BY MOUTH TWICE DAILY WITH A MEAL, Disp: 180 tablet, Rfl: 2 .  Multiple Vitamins-Minerals (CENTRUM ULTRA MENS PO), Take by mouth., Disp: , Rfl:   Current Facility-Administered Medications:  .  0.9 %  sodium chloride infusion, 500 mL, Intravenous, Continuous, Milus Banister, MD  EXAM:  Vitals:   07/24/18 1111  BP: 110/70  Pulse: 95  Temp: 98.3 F (36.8 C)    Body mass index is 24.06 kg/m.  GENERAL: vitals reviewed and listed above, alert, oriented, appears well hydrated and in no acute distress  HEENT: atraumatic, conjunttiva clear, no obvious abnormalities on inspection of external nose and ears  NECK: no obvious masses on inspection  LUNGS: clear to auscultation bilaterally, no wheezes, rales or rhonchi, good air movement  CV: HRRR, no peripheral edema  MS: moves all extremities without noticeable abnormality  PSYCH: pleasant and cooperative, no obvious depression or anxiety  ASSESSMENT AND PLAN:  Discussed the following assessment and plan:  DM (diabetes mellitus), type 2, uncontrolled, with renal complications ([place]) - Plan: Basic metabolic panel, Hemoglobin A1c  Hepatitis B, chronic (HCC)  Poor short term memory  -labs per orders -cont care with hepatologist for liver -advised he contact his neurologist about the imaging and neuropsych testing and let us know if any issues reaching them - he agreed to do  so -lifestyle recs -adjustment meds pending labs -flu shot today -CPE 3-4 months, follow up sooner as needed  Patient Instructions  BEFORE YOU LEAVE: -flu shot -labs before leaving -follow up: CPE in 3-4 months, come fasting   Call your neurologist for follow  up on memory and head concerns. Let us know if you need any assistance with this matter.  We have ordered labs or studies at this visit. It can take up to 1-2 weeks for results and processing. IF results require follow up or explanation, we will call you with instructions. Clinically stable results will be released to your [facility]. If you have not heard from [place] or cannot find your results in [facility] in 2 weeks please contact our office at [phone].  If you are not yet signed up for [facility], please consider signing up.   We recommend the following healthy lifestyle for LIFE: 1) Small portions. But, make sure to get regular (at least 3 per day), healthy meals and small healthy snacks if needed.  2) Eat a healthy clean diet.   TRY TO EAT: -at least 5-7 servings of low sugar, colorful, and nutrient rich vegetables per day (not corn, potatoes or bananas.) -berries are the best choice if you wish to eat fruit (only eat small amounts if trying to reduce weight)  -lean meets (fish, white meat of chicken or [place]) -vegan proteins for some meals - beans or tofu, whole grains, nuts and seeds -Replace bad fats with good fats - good fats include: fish, nuts and seeds, canola oil, olive oil -small amounts of low fat or non fat dairy -small amounts of100 % whole grains - check the lables -drink plenty of water  AVOID: -SUGAR, sweets, anything with added sugar, corn syrup or sweeteners - must read labels as even foods advertised as "healthy" often are loaded with sugar -if you must have a sweetener, small amounts of stevia may be best -sweetened beverages and artificially sweetened beverages -simple starches (rice, bread, potatoes, pasta, chips, etc - small amounts of 100% whole grains are ok) -red meat, pork, butter -fried foods, fast food, processed food, excessive dairy, eggs and coconut.  3)Get at least 150 minutes of sweaty aerobic exercise per week.  4)Reduce stress - consider counseling,  meditation and relaxation to balance other aspects of your life.          Lucretia Kern, DO

## 2018-07-24 NOTE — Patient Instructions (Signed)
BEFORE YOU LEAVE: -flu shot -labs before leaving -follow up: CPE in 3-4 months, come fasting   Call your neurologist for follow up on memory and head concerns. Let us know if you need any assistance with this matter.  We have ordered labs or studies at this visit. It can take up to 1-2 weeks for results and processing. IF results require follow up or explanation, we will call you with instructions. Clinically stable results will be released to your [facility]. If you have not heard from [place] or cannot find your results in [facility] in 2 weeks please contact our office at [phone].  If you are not yet signed up for [facility], please consider signing up.   We recommend the following healthy lifestyle for LIFE: 1) Small portions. But, make sure to get regular (at least 3 per day), healthy meals and small healthy snacks if needed.  2) Eat a healthy clean diet.   TRY TO EAT: -at least 5-7 servings of low sugar, colorful, and nutrient rich vegetables per day (not corn, potatoes or bananas.) -berries are the best choice if you wish to eat fruit (only eat small amounts if trying to reduce weight)  -lean meets (fish, white meat of chicken or [place]) -vegan proteins for some meals - beans or tofu, whole grains, nuts and seeds -Replace bad fats with good fats - good fats include: fish, nuts and seeds, canola oil, olive oil -small amounts of low fat or non fat dairy -small amounts of100 % whole grains - check the lables -drink plenty of water  AVOID: -SUGAR, sweets, anything with added sugar, corn syrup or sweeteners - must read labels as even foods advertised as "healthy" often are loaded with sugar -if you must have a sweetener, small amounts of stevia may be best -sweetened beverages and artificially sweetened beverages -simple starches (rice, bread, potatoes, pasta, chips, etc - small amounts of 100% whole grains are ok) -red meat, pork, butter -fried foods, fast food, processed food, excessive  dairy, eggs and coconut.  3)Get at least 150 minutes of sweaty aerobic exercise per week.  4)Reduce stress - consider counseling, meditation and relaxation to balance other aspects of your life.

## 2018-08-11 DIAGNOSIS — H40023 Open angle with borderline findings, high risk, bilateral: Secondary | ICD-10-CM | POA: Diagnosis not present

## 2018-08-25 ENCOUNTER — Encounter: Payer: Self-pay | Admitting: Internal Medicine

## 2018-08-25 ENCOUNTER — Ambulatory Visit: Payer: 59 | Admitting: Internal Medicine

## 2018-08-25 VITALS — BP 122/74 | HR 71 | Temp 97.8°F | Wt 165.2 lb

## 2018-08-25 DIAGNOSIS — J069 Acute upper respiratory infection, unspecified: Secondary | ICD-10-CM

## 2018-08-25 DIAGNOSIS — J019 Acute sinusitis, unspecified: Secondary | ICD-10-CM

## 2018-08-25 DIAGNOSIS — J22 Unspecified acute lower respiratory infection: Secondary | ICD-10-CM | POA: Diagnosis not present

## 2018-08-25 MED ORDER — DOXYCYCLINE HYCLATE 100 MG PO TABS: 100.0000 mg | ORAL_TABLET | Freq: Two times a day (BID) | ORAL | 0 refills | Status: DC

## 2018-08-25 NOTE — Patient Instructions (Addendum)
YOur exam is reassuring but   Since going on over 2 weeks  Will treat for  Bacterial sinus infection and  secondary bronchitis . I think the origina  lillness was viral and ran its course.  If getting fever   Not improved in another  weeks then FU with dr Maudie Mercury to reassess .  Cold and cough meds for comfort  And if not helping then dont take.   Hot liquids and hydration advised

## 2018-08-25 NOTE — Progress Notes (Signed)
Chief Complaint  Patient presents with  . Cough    x 2 weeks, nonproductive dry cough. Pt is having nasal congestion     HPI: Christian Vance 58 y.o. come in for sda  PCP APPT NA   Onset with "normal coughing "  ocass med  And then no change   Since onset   Noise in chest .  Lots of  dic from nose. afrein theraflu  Diabetic tussin.   Not getting better  Lots of nasal mucous  Yellow  No blood no face pain cp or sob  But very  Bad cough and ongoing  No fever.  But took ibuin cae  ROS: See pertinent positives and negatives per HPI. No cp sob fever chills  No hx of copd tobacco or liung disease per hx  Past Medical History:  Diagnosis Date  . Allergy   . Diabetes mellitus   . History of hepatitis B     Family History  Problem Relation Age of Onset  . Colon cancer Neg Hx     Social History   Socioeconomic History  . Marital status: Married    Spouse name: Christian Vance  . Number of children: Not on file  . Years of education: Not on file  . Highest education level: Doctorate  Occupational History  . Occupation: Mining engineer  Social Needs  . Financial resource strain: Not on file  . Food insecurity:    Worry: Not on file    Inability: Not on file  . Transportation needs:    Medical: Not on file    Non-medical: Not on file  Tobacco Use  . Smoking status: Never Smoker  . Smokeless tobacco: Never Used  Substance and Sexual Activity  . Alcohol use: No  . Drug use: No  . Sexual activity: Not on file  Lifestyle  . Physical activity:    Days per week: Not on file    Minutes per session: Not on file  . Stress: Not on file  Relationships  . Social connections:    Talks on phone: Not on file    Gets together: Not on file    Attends religious service: Not on file    Active member of club or organization: Not on file    Attends meetings of clubs or organizations: Not on file    Relationship status: Not on file  Other Topics Concern  . Not on file  Social History Narrative   Work or School: self employed, transportation      Home Situation: lives with wife      Spiritual Beliefs:      Lifestyle: no regular exercise; diet is healthy      Patient is right handed. He lives with his wife in a 1st floor apartment. He drinks 2-3 cups of coffee a day, and ocassionally tea. He does not exercise.          Outpatient Medications Prior to Visit  Medication Sig Dispense Refill  . fluticasone (FLONASE) 50 MCG/ACT nasal spray INSTILL 2 SPRAY IN EACH NOSTRIL DAILY 16 g 3  . ibuprofen (ADVIL,MOTRIN) 800 MG tablet Take 1 tablet (800 mg total) by mouth 3 (three) times daily. 21 tablet 0  . metFORMIN (GLUCOPHAGE) 1000 MG tablet TAKE 1 TABLET(1000 MG) BY MOUTH TWICE DAILY WITH A MEAL 180 tablet 2  . Multiple Vitamins-Minerals (CENTRUM ULTRA MENS PO) Take by mouth.     Facility-Administered Medications Prior to Visit  Medication Dose Route Frequency Provider  Last Rate Last Dose  . 0.9 %  sodium chloride infusion  500 mL Intravenous Continuous Christian Banister, MD         EXAM:  BP 122/74 (BP Location: Right Arm, Patient Position: Sitting, Cuff Size: Normal)   Pulse 71   Temp 97.8 F (36.6 C) (Oral)   Wt 165 lb 3.2 oz (74.9 kg)   SpO2 98%   BMI 23.37 kg/m   Body mass index is 23.37 kg/m.  GENERAL: vitals reviewed and listed above, alert, oriented, appears well hydrated and in no acute distress  coughing loos bronchial  And congested  HEENT: atraumatic, conjunctiva  clear, no obvious abnormalities on inspection of external nose and earstm clear nose mucoid dfrainage no pain  OP : no lesion edema or exudate  NECK: no obvious masses on inspection palpation  LUNGS: clear to auscultation bilaterally, no wheezes, rales or rhonchi,  ? Rare musical sound  Nl respirations ocass coughing fits  With expectorated phelgm  CV: HRRR, no clubbing cyanosis nl cap refill  MS: moves all extremities without noticeable focal  abnormality PSYCH: pleasant and cooperative, no obvious  depression or anxiety Lab Results  Component Value Date   WBC 4.1 10/15/2016   HGB 14.2 10/15/2016   HCT 43.5 10/15/2016   PLT 173.0 10/15/2016   GLUCOSE 126 (H) 07/24/2018   CHOL 118 02/24/2017   TRIG 146.0 02/24/2017   HDL 50.80 02/24/2017   LDLCALC 38 02/24/2017   ALT 11 10/15/2016   AST 13 10/15/2016   NA 139 07/24/2018   K 4.3 07/24/2018   CL 104 07/24/2018   CREATININE 0.72 07/24/2018   BUN 10 07/24/2018   CO2 29 07/24/2018   TSH 1.54 04/06/2018   PSA 1.01 10/15/2016   INR 1.0 04/12/2007   HGBA1C 6.0 07/24/2018   MICROALBUR 1.2 12/22/2017   BP Readings from Last 3 Encounters:  08/25/18 122/74  07/24/18 110/70  04/06/18 106/70    ASSESSMENT AND PLAN:  Discussed the following assessment and plan:  Protracted URI  complicated   Acute sinusitis, recurrence not specified, unspecified location  Lower respiratory infection (e.g., bronchitis, pneumonia, pneumonitis, pulmonitis) Prolonged resp infection  With sinus  involvement  benefoit risk about 50 50 with antibiotic  Going into 3 day holiday can add doxycycline    Expectant management. And fu  parameteres discussed  otc meds only for comfort   -Patient advised to return or notify health care team  if  new concerns arise.  Patient Instructions  YOur exam is reassuring but   Since going on over 2 weeks  Will treat for  Bacterial sinus infection and  secondary bronchitis . I think the origina  lillness was viral and ran its course.  If getting fever   Not improved in another  weeks then FU with dr Maudie Mercury to reassess .  Cold and cough meds for comfort  And if not helping then dont take.   Hot liquids and hydration advised       Standley Brooking. Panosh M.D.

## 2018-09-26 ENCOUNTER — Encounter: Payer: 59 | Admitting: Psychology

## 2018-10-24 ENCOUNTER — Encounter: Payer: 59 | Admitting: Family Medicine

## 2018-11-02 ENCOUNTER — Telehealth: Payer: Self-pay | Admitting: *Deleted

## 2018-11-07 ENCOUNTER — Encounter: Payer: 59 | Admitting: Family Medicine

## 2019-01-22 ENCOUNTER — Other Ambulatory Visit: Payer: Self-pay | Admitting: Family Medicine

## 2019-03-07 ENCOUNTER — Ambulatory Visit (INDEPENDENT_AMBULATORY_CARE_PROVIDER_SITE_OTHER): Payer: 59 | Admitting: Internal Medicine

## 2019-03-07 ENCOUNTER — Encounter: Payer: 59 | Admitting: Internal Medicine

## 2019-03-07 ENCOUNTER — Other Ambulatory Visit: Payer: Self-pay

## 2019-03-07 DIAGNOSIS — B181 Chronic viral hepatitis B without delta-agent: Secondary | ICD-10-CM

## 2019-03-07 DIAGNOSIS — E119 Type 2 diabetes mellitus without complications: Secondary | ICD-10-CM

## 2019-03-07 NOTE — Progress Notes (Signed)
Virtual Visit via Telephone Note  I connected with Christian Vance on 03/07/19 at  4:00 PM EDT by telephone and verified that I am speaking with the correct person using two identifiers.   I discussed the limitations, risks, security and privacy concerns of performing an evaluation and management service by telephone and the availability of in person appointments. I also discussed with the patient that there may be a patient responsible charge related to this service. The patient expressed understanding and agreed to proceed.  Location patient: home Location provider: work office Participants present for the call: patient, provider Patient did not have a visit in the prior 7 days to address this/these issue(s).   History of Present Illness:  This is a scheduled visit to establish care and discuss chronic medical conditions.  He is transferring care from Dr. Maudie Mercury.  His past medical history is significant for  1.  Well-controlled type 2 diabetes on metformin with the most recent A1c of 6 in December 2019.  2.  He also has a history of hepatitis B, he states he could never afford the copayments to get treated so he is unclear of the status of his hepatitis B.   Observations/Objective: Patient sounds cheerful and well on the phone. I do not appreciate any increased work of breathing. Speech and thought processing are grossly intact. Patient reported vitals: None reported   Current Outpatient Medications:  .  doxycycline (VIBRA-TABS) 100 MG tablet, Take 1 tablet (100 mg total) by mouth 2 (two) times daily., Disp: 14 tablet, Rfl: 0 .  fluticasone (FLONASE) 50 MCG/ACT nasal spray, INSTILL 2 SPRAY IN EACH NOSTRIL DAILY, Disp: 16 g, Rfl: 3 .  ibuprofen (ADVIL,MOTRIN) 800 MG tablet, Take 1 tablet (800 mg total) by mouth 3 (three) times daily., Disp: 21 tablet, Rfl: 0 .  metFORMIN (GLUCOPHAGE) 1000 MG tablet, TAKE 1 TABLET(1000 MG) BY MOUTH TWICE DAILY WITH A MEAL, Disp: 180 tablet, Rfl: 0 .   Multiple Vitamins-Minerals (CENTRUM ULTRA MENS PO), Take by mouth., Disp: , Rfl:   Current Facility-Administered Medications:  .  0.9 %  sodium chloride infusion, 500 mL, Intravenous, Continuous, Milus Banister, MD  Review of Systems:  Constitutional: Denies fever, chills, diaphoresis, appetite change and fatigue.  HEENT: Denies photophobia, eye pain, redness, hearing loss, ear pain, congestion, sore throat, rhinorrhea, sneezing, mouth sores, trouble swallowing, neck pain, neck stiffness and tinnitus.   Respiratory: Denies SOB, DOE, cough, chest tightness,  and wheezing.   Cardiovascular: Denies chest pain, palpitations and leg swelling.  Gastrointestinal: Denies nausea, vomiting, abdominal pain, diarrhea, constipation, blood in stool and abdominal distention.  Genitourinary: Denies dysuria, urgency, frequency, hematuria, flank pain and difficulty urinating.  Endocrine: Denies: hot or cold intolerance, sweats, changes in hair or nails, polyuria, polydipsia. Musculoskeletal: Denies myalgias, back pain, joint swelling, arthralgias and gait problem.  Skin: Denies pallor, rash and wound.  Neurological: Denies dizziness, seizures, syncope, weakness, light-headedness, numbness and headaches.  Hematological: Denies adenopathy. Easy bruising, personal or family bleeding history  Psychiatric/Behavioral: Denies suicidal ideation, mood changes, confusion, nervousness, sleep disturbance and agitation   Assessment and Plan:  Hepatitis B, chronic (HCC) -Need to check hepatitis B serology and viral load at follow-up appointment.  Type 2 diabetes mellitus without complication, without long-term current use of insulin (HCC) -Last A1c was 6.0 in December 2019, will come in office for follow-up A1c.    I discussed the assessment and treatment plan with the patient. The patient was provided an opportunity to  ask questions and all were answered. The patient agreed with the plan and demonstrated an  understanding of the instructions.   The patient was advised to call back or seek an in-person evaluation if the symptoms worsen or if the condition fails to improve as anticipated.  I provided 22 minutes of non-face-to-face time during this encounter.   Lelon Frohlich, MD [facility] at [facility]

## 2019-03-22 NOTE — Telephone Encounter (Signed)
Error

## 2019-04-10 ENCOUNTER — Encounter: Payer: Self-pay | Admitting: Internal Medicine

## 2019-05-24 ENCOUNTER — Other Ambulatory Visit: Payer: Self-pay | Admitting: Family Medicine

## 2019-05-30 ENCOUNTER — Telehealth (INDEPENDENT_AMBULATORY_CARE_PROVIDER_SITE_OTHER): Payer: 59 | Admitting: Internal Medicine

## 2019-05-30 ENCOUNTER — Other Ambulatory Visit: Payer: Self-pay

## 2019-05-30 DIAGNOSIS — E119 Type 2 diabetes mellitus without complications: Secondary | ICD-10-CM | POA: Diagnosis not present

## 2019-05-30 DIAGNOSIS — R200 Anesthesia of skin: Secondary | ICD-10-CM | POA: Diagnosis not present

## 2019-05-30 DIAGNOSIS — M79602 Pain in left arm: Secondary | ICD-10-CM | POA: Diagnosis not present

## 2019-05-30 DIAGNOSIS — B181 Chronic viral hepatitis B without delta-agent: Secondary | ICD-10-CM

## 2019-05-30 NOTE — Progress Notes (Signed)
Virtual Visit via Video Note  I connected with Christian Vance on 05/30/19 at  2:00 PM EDT by a video enabled telemedicine application and verified that I am speaking with the correct person using two identifiers.  Location patient: home Location provider: work office Persons participating in the virtual visit: patient, provider  I discussed the limitations of evaluation and management by telemedicine and the availability of in person appointments. The patient expressed understanding and agreed to proceed.   HPI: He has made this appointment to discuss some acute concerns:  1. Left arm pain that has been present for 3-4 months. No injuries that he can recall, no heavy lifting.  2. Numbness of fingertips of L>R hand; bothers him especially at nighttime when sleeping.  Does not check CBGs at home. Has not had an A1c drawn since 07/2018.  He also has a history of hepatitis B, he states he could never afford the copayments to get treated so he is unclear of the status of his hepatitis B.   ROS: Constitutional: Denies fever, chills, diaphoresis, appetite change and fatigue.  HEENT: Denies photophobia, eye pain, redness, hearing loss, ear pain, congestion, sore throat, rhinorrhea, sneezing, mouth sores, trouble swallowing, neck pain, neck stiffness and tinnitus.   Respiratory: Denies SOB, DOE, cough, chest tightness,  and wheezing.   Cardiovascular: Denies chest pain, palpitations and leg swelling.  Gastrointestinal: Denies nausea, vomiting, abdominal pain, diarrhea, constipation, blood in stool and abdominal distention.  Genitourinary: Denies dysuria, urgency, frequency, hematuria, flank pain and difficulty urinating.  Endocrine: Denies: hot or cold intolerance, sweats, changes in hair or nails, polyuria, polydipsia. Musculoskeletal: Denies myalgias, back pain, joint swelling, arthralgias and gait problem.  Skin: Denies pallor, rash and wound.  Neurological: Denies dizziness, seizures,  syncope, weakness, light-headedness, numbness and headaches.  Hematological: Denies adenopathy. Easy bruising, personal or family bleeding history  Psychiatric/Behavioral: Denies suicidal ideation, mood changes, confusion, nervousness, sleep disturbance and agitation   Past Medical History:  Diagnosis Date  . Allergy   . Diabetes mellitus   . History of hepatitis B     No past surgical history on file.  Family History  Problem Relation Age of Onset  . Colon cancer Neg Hx     SOCIAL HX:   reports that he has never smoked. He has never used smokeless tobacco. He reports that he does not drink alcohol or use drugs.   Current Outpatient Medications:  .  doxycycline (VIBRA-TABS) 100 MG tablet, Take 1 tablet (100 mg total) by mouth 2 (two) times daily., Disp: 14 tablet, Rfl: 0 .  fluticasone (FLONASE) 50 MCG/ACT nasal spray, INSTILL 2 SPRAY IN EACH NOSTRIL DAILY, Disp: 16 g, Rfl: 3 .  ibuprofen (ADVIL,MOTRIN) 800 MG tablet, Take 1 tablet (800 mg total) by mouth 3 (three) times daily., Disp: 21 tablet, Rfl: 0 .  metFORMIN (GLUCOPHAGE) 1000 MG tablet, TAKE 1 TABLET(1000 MG) BY MOUTH TWICE DAILY WITH A MEAL, Disp: 180 tablet, Rfl: 0 .  Multiple Vitamins-Minerals (CENTRUM ULTRA MENS PO), Take by mouth., Disp: , Rfl:   Current Facility-Administered Medications:  .  0.9 %  sodium chloride infusion, 500 mL, Intravenous, Continuous, Milus Banister, MD  EXAM:   VITALS per patient if applicable: none reported  GENERAL: alert, oriented, appears well and in no acute distress  HEENT: atraumatic, conjunttiva clear, no obvious abnormalities on inspection of external nose and ears  NECK: normal movements of the head and neck  LUNGS: on inspection no signs of respiratory distress,  breathing rate appears normal, no obvious gross increased work of breathing, gasping or wheezing  CV: no obvious cyanosis  MS: moves all visible extremities without noticeable abnormality  PSYCH/NEURO: pleasant  and cooperative, no obvious depression or anxiety, speech and thought processing grossly intact  ASSESSMENT AND PLAN:   Type 2 diabetes mellitus without complication, without long-term current use of insulin ([place]) -Needs in person appointment to check A1c levels. -Continues on metformin.  Hepatitis B, chronic (HCC) -Need to check LFTs, hepatitis B serology and viral load at follow-up appointment. -May need referral to liver clinic.  Left arm pain Numbness of fingers of both hands -Wonder about hypothyroidism, B12 deficiency, also could be diabetic neuropathy.    I discussed the assessment and treatment plan with the patient. The patient was provided an opportunity to ask questions and all were answered. The patient agreed with the plan and demonstrated an understanding of the instructions.   The patient was advised to call back or seek an in-person evaluation if the symptoms worsen or if the condition fails to improve as anticipated.    Christian Frohlich, MD  [facility] at [facility]

## 2019-06-04 ENCOUNTER — Telehealth: Payer: Self-pay | Admitting: *Deleted

## 2019-06-04 NOTE — Telephone Encounter (Signed)
Copied from [place] [id number]. Topic: Appointment Scheduling - Scheduling Inquiry for Clinic >> Jun 01, 2019  1:43 PM Alanda Slim E wrote: Reason for CRM: Pt was seen recently and told he would get a call to schedule blood work/ Please advise of lab orders

## 2019-06-05 NOTE — Telephone Encounter (Signed)
Attempted to call patient, but the mailbox was full.  Message sent to [facility] for patient to schedule a lab appointment.

## 2019-06-11 NOTE — Telephone Encounter (Signed)
Patient returning call to schedule lab only appointment. No orders are in, unable to schedule.

## 2019-06-12 NOTE — Telephone Encounter (Signed)
Unable to reach the patient by phone.  Mychart message sent asking patient to schedule an in office visit.

## 2019-06-21 ENCOUNTER — Encounter: Payer: Self-pay | Admitting: Internal Medicine

## 2019-06-21 ENCOUNTER — Other Ambulatory Visit: Payer: Self-pay

## 2019-06-21 ENCOUNTER — Ambulatory Visit: Payer: 59 | Admitting: Internal Medicine

## 2019-06-21 VITALS — BP 118/70 | HR 84 | Temp 97.4°F | Wt 170.1 lb

## 2019-06-21 DIAGNOSIS — Z23 Encounter for immunization: Secondary | ICD-10-CM

## 2019-06-21 DIAGNOSIS — B181 Chronic viral hepatitis B without delta-agent: Secondary | ICD-10-CM

## 2019-06-21 DIAGNOSIS — R202 Paresthesia of skin: Secondary | ICD-10-CM

## 2019-06-21 DIAGNOSIS — R200 Anesthesia of skin: Secondary | ICD-10-CM | POA: Diagnosis not present

## 2019-06-21 DIAGNOSIS — E119 Type 2 diabetes mellitus without complications: Secondary | ICD-10-CM

## 2019-06-21 LAB — COMPREHENSIVE METABOLIC PANEL
ALT: 12 U/L (ref 0–53)
AST: 14 U/L (ref 0–37)
Albumin: 4.3 g/dL (ref 3.5–5.2)
Alkaline Phosphatase: 63 U/L (ref 39–117)
BUN: 10 mg/dL (ref 6–23)
CO2: 30 mEq/L (ref 19–32)
Calcium: 9.5 mg/dL (ref 8.4–10.5)
Chloride: 105 mEq/L (ref 96–112)
Creatinine, Ser: 0.74 mg/dL (ref 0.40–1.50)
GFR: 130.32 mL/min (ref >60.00)
Glucose, Bld: 97 mg/dL (ref 70–99)
Potassium: 4.1 mEq/L (ref 3.5–5.1)
Sodium: 141 mEq/L (ref 135–145)
Total Bilirubin: 0.4 mg/dL (ref 0.2–1.2)
Total Protein: 7 g/dL (ref 6.0–8.3)

## 2019-06-21 LAB — POCT GLYCOSYLATED HEMOGLOBIN (HGB A1C): Hemoglobin A1C: 5.8 % — AB (ref 4.0–5.6)

## 2019-06-21 LAB — VITAMIN D 25 HYDROXY (VIT D DEFICIENCY, FRACTURES): VITD: 30.23 ng/mL (ref 30.00–100.00)

## 2019-06-21 LAB — TSH: TSH: 1.82 u[IU]/mL (ref 0.35–4.50)

## 2019-06-21 LAB — VITAMIN B12: Vitamin B-12: 464 pg/mL (ref 211–911)

## 2019-06-21 NOTE — Patient Instructions (Signed)
-  Nice seeing you today!!  -Lab work today; will notify you once results are available.  -Will send you for a liver ultrasound.  -Flu vaccine today.  -Schedule follow up in 3 months.

## 2019-06-21 NOTE — Progress Notes (Signed)
Established Patient Office Visit     CC/Reason for Visit: Follow-up chronic conditions  HPI: Christian Vance is a 58 y.o. male who is coming in today for the above mentioned reasons. Past Medical History is significant for: Well-controlled type 2 diabetes on metformin as well as a history of hepatitis B that has never been treated.  He states that he was initially diagnosed with hepatitis B in [place], never underwent treatment.  When he first came to the [place] he was sent to a liver clinic, however was unable to continue visits due to significant cost.  He states he has been having a sensation of right upper quadrant "fullness" but not pain.  Last visit he had been complaining of some left greater than right arm numbness and tingling that has been ongoing for a few months.   Past Medical/Surgical History: Past Medical History:  Diagnosis Date  . Allergy   . Diabetes mellitus   . History of hepatitis B     No past surgical history on file.  Social History:  reports that he has never smoked. He has never used smokeless tobacco. He reports that he does not drink alcohol or use drugs.  Allergies: No Known Allergies  Family History:  Family History  Problem Relation Age of Onset  . Colon cancer Neg Hx      Current Outpatient Medications:  .  ibuprofen (ADVIL,MOTRIN) 800 MG tablet, Take 1 tablet (800 mg total) by mouth 3 (three) times daily., Disp: 21 tablet, Rfl: 0 .  metFORMIN (GLUCOPHAGE) 1000 MG tablet, TAKE 1 TABLET(1000 MG) BY MOUTH TWICE DAILY WITH A MEAL, Disp: 180 tablet, Rfl: 0 .  Multiple Vitamins-Minerals (CENTRUM ULTRA MENS PO), Take by mouth., Disp: , Rfl:   Current Facility-Administered Medications:  .  0.9 %  sodium chloride infusion, 500 mL, Intravenous, Continuous, Milus Banister, MD  Review of Systems:  Constitutional: Denies fever, chills, diaphoresis, appetite change and fatigue.  HEENT: Denies photophobia, eye pain, redness, hearing loss, ear pain,  congestion, sore throat, rhinorrhea, sneezing, mouth sores, trouble swallowing, neck pain, neck stiffness and tinnitus.   Respiratory: Denies SOB, DOE, cough, chest tightness,  and wheezing.   Cardiovascular: Denies chest pain, palpitations and leg swelling.  Gastrointestinal: Denies nausea, vomiting, abdominal pain, diarrhea, constipation, blood in stool and abdominal distention.  Genitourinary: Denies dysuria, urgency, frequency, hematuria, flank pain and difficulty urinating.  Endocrine: Denies: hot or cold intolerance, sweats, changes in hair or nails, polyuria, polydipsia. Musculoskeletal: Denies myalgias, back pain, joint swelling, arthralgias and gait problem.  Skin: Denies pallor, rash and wound.  Neurological: Denies dizziness, seizures, syncope, weakness, light-headedness and headaches.  Hematological: Denies adenopathy. Easy bruising, personal or family bleeding history  Psychiatric/Behavioral: Denies suicidal ideation, mood changes, confusion, nervousness, sleep disturbance and agitation    Physical Exam: Vitals:   06/21/19 1402  BP: 118/70  Pulse: 84  Temp: (!) 97.4 F (36.3 C)  TempSrc: Temporal  SpO2: 97%  Weight: 170 lb 1.6 oz (77.2 kg)    Body mass index is 24.06 kg/m.   Constitutional: NAD, calm, comfortable Eyes: PERRL, lids and conjunctivae normal ENMT: Mucous membranes are moist.  Respiratory: clear to auscultation bilaterally, no wheezing, no crackles. Normal respiratory effort. No accessory muscle use.  Cardiovascular: Regular rate and rhythm, no murmurs / rubs / gallops. No extremity edema. 2+ pedal pulses.  Abdomen: no tenderness, no masses palpated. No hepatosplenomegaly. Bowel sounds positive.  Musculoskeletal: no clubbing / cyanosis. No joint deformity upper  and lower extremities. Good ROM, no contractures. Normal muscle tone.  Skin: no rashes, lesions, ulcers. No induration Neurologic: Grossly intact and nonfocal Psychiatric: Normal judgment and  insight. Alert and oriented x 3. Normal mood.    Impression and Plan:  Type 2 diabetes mellitus without complication, without long-term current use of insulin (HCC)  -A1c today is 5.8 demonstrating excellent control.   Hepatitis B, chronic (HCC)  -Since I am unaware of the status of his hepatitis B, will order serology today, will also order a hepatitis C antibody, LFTs and liver ultrasound. -He has been advised that he may end up needing referral to hepatology pending these results and we have also talked about the necessity of follow-up giving high risk of liver cirrhosis and liver cancer with untreated hepatitis B.  Numbness and tingling in left arm -Unlikely diabetic neuropathy given how well controlled his DM is. -Check Vit B12/D and TSH. -Since has been going on for months doubt TIA equivalent.   Time spent: 27 minutes. Greater than 50% of this time was spent in direct contact with the patient, coordinating care and discussing relevant ongoing clinical issues, including importance of adequate diagnosis and treatment of active hepatitis B to prevent cirrhosis and hepatic cancer.    Patient Instructions  -Nice seeing you today!!  -Lab work today; will notify you once results are available.  -Will send you for a liver ultrasound.  -Flu vaccine today.  -Schedule follow up in 3 months.     Lelon Frohlich, MD [facility] at [facility]

## 2019-06-25 ENCOUNTER — Ambulatory Visit
Admission: RE | Admit: 2019-06-25 | Discharge: 2019-06-25 | Disposition: A | Discharge status: Home / Self Care | Payer: 59 | Source: Ambulatory Visit | Attending: Internal Medicine | Admitting: Internal Medicine

## 2019-06-25 DIAGNOSIS — B181 Chronic viral hepatitis B without delta-agent: Secondary | ICD-10-CM

## 2019-06-26 LAB — HEPATITIS B E ANTIGEN: Hep B E Ag: NONREACTIVE

## 2019-06-26 LAB — HEPATITIS C ANTIBODY
Hepatitis C Ab: NONREACTIVE
SIGNAL TO CUT-OFF: 0.02 (ref <1.00)

## 2019-06-26 LAB — HEPATITIS B SURFACE ANTIGEN: Hepatitis B Surface Ag: REACTIVE — AB

## 2019-06-26 LAB — HEPATITIS B SURFACE ANTIBODY,QUALITATIVE: Hep B S Ab: NONREACTIVE

## 2019-06-26 LAB — HEPATITIS B DNA, ULTRAQUANTITATIVE, PCR
Hepatitis B DNA (Calc): 2.85 Log IU/mL — ABNORMAL HIGH
Hepatitis B DNA: 702 IU/mL — ABNORMAL HIGH

## 2019-06-27 ENCOUNTER — Telehealth: Payer: Self-pay | Admitting: Internal Medicine

## 2019-06-27 NOTE — Telephone Encounter (Signed)
Pt. Is requesting results from Abdominal US that was done on 06/25/19.  Will send note to PCP.

## 2019-06-28 ENCOUNTER — Other Ambulatory Visit: Payer: Self-pay | Admitting: Internal Medicine

## 2019-06-28 DIAGNOSIS — B181 Chronic viral hepatitis B without delta-agent: Secondary | ICD-10-CM

## 2019-06-28 NOTE — Telephone Encounter (Signed)
It showed nonspecific changes in the texture of the liver. I suspect likely due to active hep B but could be from fatty infiltration of the liver.

## 2019-06-29 NOTE — Telephone Encounter (Signed)
Patient is aware 

## 2019-07-09 ENCOUNTER — Ambulatory Visit: Payer: 59 | Admitting: Family

## 2019-07-09 ENCOUNTER — Encounter: Payer: Self-pay | Admitting: Family

## 2019-07-09 ENCOUNTER — Other Ambulatory Visit: Payer: Self-pay

## 2019-07-09 VITALS — BP 116/76 | HR 88 | Temp 97.8°F | Wt 171.0 lb

## 2019-07-09 DIAGNOSIS — B181 Chronic viral hepatitis B without delta-agent: Secondary | ICD-10-CM

## 2019-07-09 NOTE — Patient Instructions (Addendum)
Nice to meet you.  It appears as though you have chronic inactive Hepatitis B.  No treatment is indicated at the present time.  Plan for follow up in 6 months or sooner if needed with lab work 1-2 weeks prior to appointment. This can also be done via PCP if you wish.   Hepatitis B Hepatitis B is a viral infection of the liver. There are two kinds of hepatitis B:  Acute hepatitis B. This lasts for six months or less.  Chronic hepatitis B. This lasts for more than six months. Chronic hepatitis B can lead to liver failure, scarring of the liver (cirrhosis), or liver cancer. Acute hepatitis B can turn into chronic hepatitis B. Most adults with acute hepatitis B do not develop chronic hepatitis B. Infants and young children who get hepatitis B are more likely to develop chronic hepatitis B than adults. The hepatitis B vaccine can prevent this condition. What are the causes? This condition is caused by the hepatitis B virus (HBV). The virus may spread from person to person (is contagious) through:  Blood.  Childbirth. A woman who has hepatitis B can pass it to her baby during birth.  Bodily fluids such as breast milk, tears, semen, vaginal fluids, and saliva. What increases the risk? The following factors may make you more likely to develop this condition:  Having contact with unclean (contaminated) needles or syringes. This may result from: ? Acupuncture. ? Tattooing. ? Body piercing. ? Injecting drugs.  Having unprotected sex with someone who is infected.  Living with or having close contact with a person who has hepatitis B.  Working in a job that involves contact with blood or bodily fluids, such as health care.  Traveling to a country that has many cases of hepatitis B.  Being on treatment to filter your blood (kidney dialysis).  Having a history of blood transfusions or organ transplants. What are the signs or symptoms? Symptoms of this condition may include:  Loss of  appetite.  Fatigue.  Nausea.  Vomiting.  Stomach pain.  Dark yellow urine.  Yellowish skin and eyes (jaundice).  Fever.  Light-colored or gray bowel movements.  Joint pain. In some cases, you may not have any symptoms. How is this diagnosed? This condition is diagnosed based on:  A physical exam.  Your medical history.  Blood tests. How is this treated? Treatment for chronic hepatitis B may include antiviral medicine. This medicine may help:  Lower your risk of liver failure, cirrhosis, or liver cancer.  Lower your ability to infect others with hepatitis B. You will need to avoid alcohol and medicines that can be hard for the liver to break down (metabolize). This helps prevent further injury to your liver. Follow these instructions at home: Medicines   Take over-the-counter and prescription medicines only as told by your health care provider.  Take your antiviral medicine as told by your health care provider. Do not stop taking the antiviral even if you start to feel better.  Do not take any over-the-counter medicines that contain acetaminophen.  Do not take any new medicines, including over-the-counter medicines for fever or pain, unless approved by your health care provider. Activity  Rest as needed.  Do not have sex unless approved by your health care provider.  Ask your health care provider when you may return to school or work. Eating and drinking  Eat a balanced diet with plenty of fruits and vegetables, whole grains, and lowfat (lean) meats or other non-meat proteins (  such as beans or tofu).  Drink enough fluids to keep your urine clear or pale yellow.  Avoid alcohol. General instructions  Do not share toothbrushes, nail clippers, or razors.  Wash your hands frequently with soap and water. If soap and water are not available, use hand sanitizer.  Keep all follow-up visits as told by your health care provider. This is important. How is this  prevented?  Get the hepatitis B vaccine. This helps prevent the hepatitis B infection.  Wash your hands frequently with soap and water. If soap and water are not available, use hand sanitizer.  Do not share needles or syringes.  Practice safe sex and use condoms.  Avoid handling blood or bodily fluids without gloves or other protection.  Avoid getting tattoos or piercings in shops or other locations that are not clean. Contact a health care provider if:  You develop a rash.  You develop jaundice, or your chronic jaundice becomes more severe.  You have a fever. Get help right away if:  You are unable to eat or drink.  You have a fever along with nausea or vomiting.  You feel confused.  You have trouble breathing.  Your skin, throat, mouth, or face becomes swollen.  You have jerky movements that you cannot control (seizure).  You become very sleepy or have trouble waking up.  Your stomach becomes very swollen. Summary  Hepatitis B is a viral infection of the liver. There are two kinds of hepatitis B: acute and chronic.  The hepatitis B virus (HBV) can be passed from person to person (is contagious).  You should not take any new medicines, including over-the-counter medicines for fever or pain, unless approved by your health care provider.  To help prevent hepatitis B, wash your hands frequently with soap and water. If soap and water are not available, use hand sanitizer. This information is not intended to replace advice given to you by your health care provider. Make sure you discuss any questions you have with your health care provider. Document Released: 07/23/2000 Document Revised: 07/08/2017 Document Reviewed: 08/31/2016 Elsevier Patient Education  2020 [employer].

## 2019-07-09 NOTE — Progress Notes (Signed)
Subjective:    Patient ID: Christian Vance, male    DOB: [birth year], 58 y.o.   MRN: [id number]  Chief Complaint  Patient presents with  . Hepatitis B    HPI:  Christian Vance is a 58 y.o. male with previous medical history of type 2 diabetes and chronic hepatitis B presenting today at the request of his primary care provider for evaluation and treatment of hepatitis B.  Christian Vance completed blood work on 06/21/19 showing a positive Hepatitis B surface antigen with a viral load of 702 and non-reactive Hepatitis B E antigen. He did express having a sensation of right upper quadrant "fullness" during exam but no pain. Ultrasound performed on 06/25/19 with no focal lesions and mild inhomogeneous liver echotexture which was likely related to fatty infiltration. AST was 14 and ALT was 12. There was no platelt level available for review. Hepatitis C testing was negative.  Christian Vance moved to the [place] from [place] in 2005 originally from [place]. He was first diagnosed when he was in [place] about 30 years ago. He has no personal or family history of liver disease. No treatments to date. Having fullness in the upper right abdomen. No jaundice or scleral icterus.  Takes Tylenol on occasion and does not drink alcohol regularly.  No Known Allergies    Outpatient Medications Prior to Visit  Medication Sig Dispense Refill  . ibuprofen (ADVIL,MOTRIN) 800 MG tablet Take 1 tablet (800 mg total) by mouth 3 (three) times daily. 21 tablet 0  . metFORMIN (GLUCOPHAGE) 1000 MG tablet TAKE 1 TABLET(1000 MG) BY MOUTH TWICE DAILY WITH A MEAL 180 tablet 0  . Multiple Vitamins-Minerals (CENTRUM ULTRA MENS PO) Take by mouth.     Facility-Administered Medications Prior to Visit  Medication Dose Route Frequency Provider Last Rate Last Dose  . 0.9 %  sodium chloride infusion  500 mL Intravenous Continuous Milus Banister, MD         Past Medical History:  Diagnosis Date  . Allergy   . Diabetes mellitus   . History  of hepatitis B       No past surgical history on file.    Family History  Problem Relation Age of Onset  . Colon cancer Neg Hx       Social History   Socioeconomic History  . Marital status: Married    Spouse name: [name]  . Number of children: Not on file  . Years of education: Not on file  . Highest education level: Doctorate  Occupational History  . Occupation: Mining engineer  Social Needs  . Financial resource strain: Not on file  . Food insecurity    Worry: Not on file    Inability: Not on file  . Transportation needs    Medical: Not on file    Non-medical: Not on file  Tobacco Use  . Smoking status: Never Smoker  . Smokeless tobacco: Never Used  Substance and Sexual Activity  . Alcohol use: No  . Drug use: No  . Sexual activity: Not on file  Lifestyle  . Physical activity    Days per week: Not on file    Minutes per session: Not on file  . Stress: Not on file  Relationships  . Social Herbalist on phone: Not on file    Gets together: Not on file    Attends religious service: Not on file    Active member of club or organization: Not on file  Attends meetings of clubs or organizations: Not on file    Relationship status: Not on file  . Intimate partner violence    Fear of current or ex partner: Not on file    Emotionally abused: Not on file    Physically abused: Not on file    Forced sexual activity: Not on file  Other Topics Concern  . Not on file  Social History Narrative   Work or School: self employed, transportation      Home Situation: lives with wife      Spiritual Beliefs:      Lifestyle: no regular exercise; diet is healthy      Patient is right handed. He lives with his wife in a 1st floor apartment. He drinks 2-3 cups of coffee a day, and ocassionally tea. He does not exercise.            Review of Systems  Constitutional: Negative for chills, diaphoresis, fatigue and fever.  Respiratory: Negative for cough, chest  tightness, shortness of breath and wheezing.   Cardiovascular: Negative for chest pain.  Gastrointestinal: Negative for abdominal distention, abdominal pain, constipation, diarrhea, nausea and vomiting.  Neurological: Negative for weakness and headaches.  Hematological: Does not bruise/bleed easily.       Objective:    BP 116/76   Pulse 88   Temp 97.8 F (36.6 C) (Oral)   Wt 171 lb (77.6 kg)   BMI 24.19 kg/m  Nursing note and vital signs reviewed.  Physical Exam Constitutional:      General: He is not in acute distress.    Appearance: He is well-developed.  Cardiovascular:     Rate and Rhythm: Normal rate and regular rhythm.     Heart sounds: Normal heart sounds. No murmur. No friction rub. No gallop.   Pulmonary:     Effort: Pulmonary effort is normal. No respiratory distress.     Breath sounds: Normal breath sounds. No wheezing or rales.  Chest:     Chest wall: No tenderness.  Abdominal:     General: Bowel sounds are normal. There is no distension.     Palpations: Abdomen is soft. There is no mass.     Tenderness: There is no abdominal tenderness. There is no guarding or rebound.  Skin:    General: Skin is warm and dry.  Neurological:     Mental Status: He is alert and oriented to person, place, and time.  Psychiatric:        Behavior: Behavior normal.        Thought Content: Thought content normal.        Judgment: Judgment normal.         Assessment & Plan:   Patient Active Problem List   Diagnosis Date Noted  . DM (diabetes mellitus), type 2 ([place]) 12/03/2013  . Hepatitis B, chronic ([place]) 12/03/2013     Problem List Items Addressed This Visit      Digestive   Hepatitis B, chronic ([place]) - Primary    Christian Vance is a 58 y/o male with blood work that is consistent with inactive chronic Hepatitis B. Liver function testing within normal ranges and ultrasound without evidence of cirrhosis. There is no indication for treatment at the present time. We reviewed  lab work and discussed the pathophysiology of hepatitis B along with transmission and treatment should it be indicated. Will plan for follow up in 6 months or sooner if needed.       Relevant Orders  Hepatic function panel   CBC   Hepatitis B DNA, Ultraquantitative, PCR   Hepatitis B E Antigen       I am having Christian Vance maintain his Multiple Vitamins-Minerals (CENTRUM ULTRA MENS PO), ibuprofen, and metFORMIN. We will continue to administer sodium chloride.   Follow-up: Return in about 6 months (around 01/06/2020).    Terri Piedra, MSN, FNP-C Nurse Practitioner [facility] Office phone: [phone] Pager: [place] number: [phone]

## 2019-07-09 NOTE — Assessment & Plan Note (Signed)
Christian Vance is a 58 y/o male with blood work that is consistent with inactive chronic Hepatitis B. Liver function testing within normal ranges and ultrasound without evidence of cirrhosis. There is no indication for treatment at the present time. We reviewed lab work and discussed the pathophysiology of hepatitis B along with transmission and treatment should it be indicated. Will plan for follow up in 6 months or sooner if needed.

## 2019-08-20 ENCOUNTER — Other Ambulatory Visit: Payer: Self-pay | Admitting: Internal Medicine

## 2019-09-04 ENCOUNTER — Telehealth: Payer: 59 | Admitting: Internal Medicine

## 2019-09-18 ENCOUNTER — Telehealth (INDEPENDENT_AMBULATORY_CARE_PROVIDER_SITE_OTHER): Payer: 59 | Admitting: Internal Medicine

## 2019-09-18 ENCOUNTER — Other Ambulatory Visit: Payer: Self-pay

## 2019-09-18 DIAGNOSIS — R202 Paresthesia of skin: Secondary | ICD-10-CM

## 2019-09-18 DIAGNOSIS — R200 Anesthesia of skin: Secondary | ICD-10-CM

## 2019-09-18 DIAGNOSIS — M542 Cervicalgia: Secondary | ICD-10-CM

## 2019-09-18 DIAGNOSIS — E119 Type 2 diabetes mellitus without complications: Secondary | ICD-10-CM | POA: Diagnosis not present

## 2019-09-18 MED ORDER — METFORMIN HCL 1000 MG PO TABS: 1000.0000 mg | ORAL_TABLET | Freq: Two times a day (BID) | ORAL | 1 refills | Status: DC
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
## 2019-10-01 LAB — HM DIABETES EYE EXAM

## 2019-10-10 ENCOUNTER — Encounter: Payer: Self-pay | Admitting: Internal Medicine

## 2019-10-11 ENCOUNTER — Ambulatory Visit: Payer: 59 | Attending: Internal Medicine

## 2019-10-11 DIAGNOSIS — Z20822 Contact with and (suspected) exposure to covid-19: Secondary | ICD-10-CM

## 2019-10-12 LAB — NOVEL CORONAVIRUS, NAA: SARS-CoV-2, NAA: NOT DETECTED

## 2019-12-24 ENCOUNTER — Other Ambulatory Visit: Payer: 59

## 2019-12-24 ENCOUNTER — Other Ambulatory Visit: Payer: Self-pay

## 2019-12-24 DIAGNOSIS — B181 Chronic viral hepatitis B without delta-agent: Secondary | ICD-10-CM

## 2019-12-26 LAB — CBC
HCT: 43.1 % (ref 38.5–50.0)
Hemoglobin: 14.2 g/dL (ref 13.2–17.1)
MCH: 32.1 pg (ref 27.0–33.0)
MCHC: 32.9 g/dL (ref 32.0–36.0)
MCV: 97.3 fL (ref 80.0–100.0)
MPV: 10.8 fL (ref 7.5–12.5)
Platelets: 179 10*3/uL (ref 140–400)
RBC: 4.43 10*6/uL (ref 4.20–5.80)
RDW: 12.7 % (ref 11.0–15.0)
WBC: 3.9 10*3/uL (ref 3.8–10.8)

## 2019-12-26 LAB — HEPATIC FUNCTION PANEL
AG Ratio: 1.5 (calc) (ref 1.0–2.5)
ALT: 13 U/L (ref 9–46)
AST: 13 U/L (ref 10–35)
Albumin: 4.1 g/dL (ref 3.6–5.1)
Alkaline phosphatase (APISO): 66 U/L (ref 35–144)
Bilirubin, Direct: 0.1 mg/dL (ref 0.0–0.2)
Globulin: 2.7 g/dL (calc) (ref 1.9–3.7)
Indirect Bilirubin: 0.4 mg/dL (calc) (ref 0.2–1.2)
Total Bilirubin: 0.5 mg/dL (ref 0.2–1.2)
Total Protein: 6.8 g/dL (ref 6.1–8.1)

## 2019-12-26 LAB — HEPATITIS B E ANTIGEN: Hep B E Ag: NONREACTIVE

## 2019-12-26 LAB — HEPATITIS B DNA, ULTRAQUANTITATIVE, PCR
Hepatitis B DNA (Calc): 2.84 Log IU/mL — ABNORMAL HIGH
Hepatitis B DNA: 686 IU/mL — ABNORMAL HIGH

## 2020-01-18 ENCOUNTER — Other Ambulatory Visit: Payer: Self-pay

## 2020-01-18 ENCOUNTER — Inpatient Hospital Stay (HOSPITAL_COMMUNITY)
Admission: EM | Admit: 2020-01-18 | Discharge: 2020-01-21 | DRG: 378 | Disposition: A | Discharge status: Home / Self Care | Payer: 59 | Attending: Internal Medicine | Admitting: Internal Medicine

## 2020-01-18 ENCOUNTER — Other Ambulatory Visit: Payer: Self-pay | Admitting: Physician Assistant

## 2020-01-18 ENCOUNTER — Encounter (HOSPITAL_COMMUNITY): Payer: Self-pay | Admitting: Emergency Medicine

## 2020-01-18 DIAGNOSIS — K640 First degree hemorrhoids: Secondary | ICD-10-CM | POA: Diagnosis present

## 2020-01-18 DIAGNOSIS — Z7984 Long term (current) use of oral hypoglycemic drugs: Secondary | ICD-10-CM | POA: Diagnosis not present

## 2020-01-18 DIAGNOSIS — K5731 Diverticulosis of large intestine without perforation or abscess with bleeding: Secondary | ICD-10-CM | POA: Diagnosis present

## 2020-01-18 DIAGNOSIS — K3189 Other diseases of stomach and duodenum: Secondary | ICD-10-CM | POA: Diagnosis not present

## 2020-01-18 DIAGNOSIS — B181 Chronic viral hepatitis B without delta-agent: Secondary | ICD-10-CM | POA: Diagnosis present

## 2020-01-18 DIAGNOSIS — R072 Precordial pain: Secondary | ICD-10-CM | POA: Diagnosis present

## 2020-01-18 DIAGNOSIS — K635 Polyp of colon: Secondary | ICD-10-CM | POA: Diagnosis present

## 2020-01-18 DIAGNOSIS — Z79899 Other long term (current) drug therapy: Secondary | ICD-10-CM | POA: Diagnosis not present

## 2020-01-18 DIAGNOSIS — Z86010 Personal history of colonic polyps: Secondary | ICD-10-CM | POA: Diagnosis not present

## 2020-01-18 DIAGNOSIS — Z20822 Contact with and (suspected) exposure to covid-19: Secondary | ICD-10-CM | POA: Diagnosis present

## 2020-01-18 DIAGNOSIS — K319 Disease of stomach and duodenum, unspecified: Secondary | ICD-10-CM | POA: Diagnosis present

## 2020-01-18 DIAGNOSIS — Z8719 Personal history of other diseases of the digestive system: Secondary | ICD-10-CM

## 2020-01-18 DIAGNOSIS — K921 Melena: Secondary | ICD-10-CM | POA: Diagnosis not present

## 2020-01-18 DIAGNOSIS — K760 Fatty (change of) liver, not elsewhere classified: Secondary | ICD-10-CM | POA: Diagnosis present

## 2020-01-18 DIAGNOSIS — E119 Type 2 diabetes mellitus without complications: Secondary | ICD-10-CM | POA: Diagnosis present

## 2020-01-18 DIAGNOSIS — D7589 Other specified diseases of blood and blood-forming organs: Secondary | ICD-10-CM | POA: Diagnosis present

## 2020-01-18 DIAGNOSIS — K922 Gastrointestinal hemorrhage, unspecified: Secondary | ICD-10-CM | POA: Diagnosis present

## 2020-01-18 DIAGNOSIS — D127 Benign neoplasm of rectosigmoid junction: Secondary | ICD-10-CM | POA: Diagnosis not present

## 2020-01-18 DIAGNOSIS — K259 Gastric ulcer, unspecified as acute or chronic, without hemorrhage or perforation: Secondary | ICD-10-CM | POA: Diagnosis present

## 2020-01-18 DIAGNOSIS — Z791 Long term (current) use of non-steroidal anti-inflammatories (NSAID): Secondary | ICD-10-CM | POA: Diagnosis not present

## 2020-01-18 DIAGNOSIS — K5521 Angiodysplasia of colon with hemorrhage: Secondary | ICD-10-CM | POA: Diagnosis present

## 2020-01-18 DIAGNOSIS — K5520 Angiodysplasia of colon without hemorrhage: Secondary | ICD-10-CM | POA: Diagnosis not present

## 2020-01-18 DIAGNOSIS — K5730 Diverticulosis of large intestine without perforation or abscess without bleeding: Secondary | ICD-10-CM

## 2020-01-18 DIAGNOSIS — D62 Acute posthemorrhagic anemia: Secondary | ICD-10-CM | POA: Diagnosis present

## 2020-01-18 HISTORY — DX: Unspecified viral hepatitis B without hepatic coma: B19.10

## 2020-01-18 LAB — COMPREHENSIVE METABOLIC PANEL
ALT: 16 U/L (ref 0–44)
AST: 18 U/L (ref 15–41)
Albumin: 3.6 g/dL (ref 3.5–5.0)
Alkaline Phosphatase: 49 U/L (ref 38–126)
Anion gap: 9 (ref 5–15)
BUN: 14 mg/dL (ref 8–23)
CO2: 22 mmol/L (ref 22–32)
Calcium: 8.9 mg/dL (ref 8.9–10.3)
Chloride: 109 mmol/L (ref 98–111)
Creatinine, Ser: 0.89 mg/dL (ref 0.61–1.24)
GFR calc Af Amer: >60 mL/min (ref >60)
GFR calc non Af Amer: >60 mL/min (ref >60)
Glucose, Bld: 118 mg/dL — ABNORMAL HIGH (ref 70–99)
Potassium: 4 mmol/L (ref 3.5–5.1)
Sodium: 140 mmol/L (ref 135–145)
Total Bilirubin: 1.1 mg/dL (ref 0.3–1.2)
Total Protein: 6.3 g/dL — ABNORMAL LOW (ref 6.5–8.1)

## 2020-01-18 LAB — URINALYSIS, COMPLETE (UACMP) WITH MICROSCOPIC
Bacteria, UA: NONE SEEN
Bilirubin Urine: NEGATIVE
Glucose, UA: NEGATIVE mg/dL
Hgb urine dipstick: NEGATIVE
Ketones, ur: NEGATIVE mg/dL
Leukocytes,Ua: NEGATIVE
Nitrite: NEGATIVE
Protein, ur: NEGATIVE mg/dL
Specific Gravity, Urine: 1.008 (ref 1.005–1.030)
pH: 5 (ref 5.0–8.0)

## 2020-01-18 LAB — HEMOGLOBIN A1C
Hgb A1c MFr Bld: 6.2 % — ABNORMAL HIGH (ref 4.8–5.6)
Mean Plasma Glucose: 131.24 mg/dL

## 2020-01-18 LAB — CBC WITH DIFFERENTIAL/PLATELET
Abs Immature Granulocytes: 0.01 10*3/uL (ref 0.00–0.07)
Basophils Absolute: 0 10*3/uL (ref 0.0–0.1)
Basophils Relative: 0 %
Eosinophils Absolute: 0 10*3/uL (ref 0.0–0.5)
Eosinophils Relative: 1 %
HCT: 28 % — ABNORMAL LOW (ref 39.0–52.0)
Hemoglobin: 9.3 g/dL — ABNORMAL LOW (ref 13.0–17.0)
Immature Granulocytes: 0 %
Lymphocytes Relative: 45 %
Lymphs Abs: 2.7 10*3/uL (ref 0.7–4.0)
MCH: 33.1 pg (ref 26.0–34.0)
MCHC: 33.2 g/dL (ref 30.0–36.0)
MCV: 99.6 fL (ref 80.0–100.0)
Monocytes Absolute: 0.5 10*3/uL (ref 0.1–1.0)
Monocytes Relative: 9 %
Neutro Abs: 2.7 10*3/uL (ref 1.7–7.7)
Neutrophils Relative %: 45 %
Platelets: 134 10*3/uL — ABNORMAL LOW (ref 150–400)
RBC: 2.81 MIL/uL — ABNORMAL LOW (ref 4.22–5.81)
RDW: 13.1 % (ref 11.5–15.5)
WBC: 6 10*3/uL (ref 4.0–10.5)
nRBC: 0 % (ref 0.0–0.2)

## 2020-01-18 LAB — FOLATE: Folate: 29.6 ng/mL (ref >5.9)

## 2020-01-18 LAB — HIV ANTIBODY (ROUTINE TESTING W REFLEX): HIV Screen 4th Generation wRfx: NONREACTIVE

## 2020-01-18 LAB — SARS CORONAVIRUS 2 BY RT PCR (HOSPITAL ORDER, PERFORMED IN ~~LOC~~ HOSPITAL LAB): SARS Coronavirus 2: NEGATIVE

## 2020-01-18 LAB — CBC
HCT: 33.7 % — ABNORMAL LOW (ref 39.0–52.0)
Hemoglobin: 10.8 g/dL — ABNORMAL LOW (ref 13.0–17.0)
MCH: 32.2 pg (ref 26.0–34.0)
MCHC: 32 g/dL (ref 30.0–36.0)
MCV: 100.6 fL — ABNORMAL HIGH (ref 80.0–100.0)
Platelets: 172 10*3/uL (ref 150–400)
RBC: 3.35 MIL/uL — ABNORMAL LOW (ref 4.22–5.81)
RDW: 13.2 % (ref 11.5–15.5)
WBC: 7 10*3/uL (ref 4.0–10.5)
nRBC: 0 % (ref 0.0–0.2)

## 2020-01-18 LAB — TROPONIN I (HIGH SENSITIVITY): Troponin I (High Sensitivity): 8 ng/L (ref <18)

## 2020-01-18 LAB — VITAMIN B12: Vitamin B-12: 301 pg/mL (ref 180–914)

## 2020-01-18 LAB — GLUCOSE, CAPILLARY: Glucose-Capillary: 82 mg/dL (ref 70–99)

## 2020-01-18 LAB — APTT: aPTT: 30 seconds (ref 24–36)

## 2020-01-18 LAB — PROTIME-INR
INR: 1.2 (ref 0.8–1.2)
Prothrombin Time: 14.8 seconds (ref 11.4–15.2)

## 2020-01-18 LAB — ABO/RH: ABO/RH(D): O POS

## 2020-01-18 MED ORDER — PEG-KCL-NACL-NASULF-NA ASC-C 100 G PO SOLR
0.5000 | Freq: Once | ORAL | Status: AC
  Administered 2020-01-18: 100 g via ORAL
  Filled 2020-01-18: qty 1

## 2020-01-18 MED ORDER — METOCLOPRAMIDE HCL 5 MG/ML IJ SOLN
10.0000 mg | Freq: Once | INTRAMUSCULAR | Status: AC
  Administered 2020-01-18: 10 mg via INTRAVENOUS
  Filled 2020-01-18: qty 2

## 2020-01-18 MED ORDER — ACETAMINOPHEN 650 MG RE SUPP: 650.0000 mg | Freq: Four times a day (QID) | RECTAL | Status: DC | PRN

## 2020-01-18 MED ORDER — PEG-KCL-NACL-NASULF-NA ASC-C 100 G PO SOLR: 1.0000 | Freq: Once | ORAL | Status: DC

## 2020-01-18 MED ORDER — ACETAMINOPHEN 325 MG PO TABS
650.0000 mg | ORAL_TABLET | Freq: Four times a day (QID) | ORAL | Status: DC | PRN
  Administered 2020-01-19: 650 mg via ORAL
  Filled 2020-01-18: qty 2

## 2020-01-18 MED ORDER — INSULIN ASPART 100 UNIT/ML ~~LOC~~ SOLN
0.0000 [IU] | Freq: Three times a day (TID) | SUBCUTANEOUS | Status: DC
  Administered 2020-01-19: 5 [IU] via SUBCUTANEOUS
  Administered 2020-01-20 (×2): 3 [IU] via SUBCUTANEOUS
  Administered 2020-01-20: 2 [IU] via SUBCUTANEOUS
  Administered 2020-01-21: 3 [IU] via SUBCUTANEOUS

## 2020-01-18 MED ORDER — ONDANSETRON HCL 4 MG/2ML IJ SOLN
4.0000 mg | Freq: Four times a day (QID) | INTRAMUSCULAR | Status: DC | PRN
  Administered 2020-01-18: 4 mg via INTRAVENOUS
  Filled 2020-01-18: qty 2

## 2020-01-18 MED ORDER — BISACODYL 5 MG PO TBEC
20.0000 mg | DELAYED_RELEASE_TABLET | Freq: Once | ORAL | Status: AC
  Administered 2020-01-18: 20 mg via ORAL
  Filled 2020-01-18: qty 4

## 2020-01-18 MED ORDER — INSULIN ASPART 100 UNIT/ML ~~LOC~~ SOLN: 0.0000 [IU] | Freq: Every day | SUBCUTANEOUS | Status: DC

## 2020-01-18 MED ORDER — HYDROMORPHONE HCL 1 MG/ML IJ SOLN
0.5000 mg | INTRAMUSCULAR | Status: DC | PRN
  Administered 2020-01-18: 1 mg via INTRAVENOUS
  Filled 2020-01-18: qty 1

## 2020-01-18 MED ORDER — SODIUM CHLORIDE 0.9 % IV SOLN: INTRAVENOUS | Status: DC

## 2020-01-18 MED ORDER — ONDANSETRON HCL 4 MG PO TABS: 4.0000 mg | ORAL_TABLET | Freq: Four times a day (QID) | ORAL | Status: DC | PRN

## 2020-01-18 MED ORDER — LACTATED RINGERS IV BOLUS
1000.0000 mL | Freq: Once | INTRAVENOUS | Status: AC
  Administered 2020-01-18: 1000 mL via INTRAVENOUS

## 2020-01-18 NOTE — Plan of Care (Signed)

## 2020-01-18 NOTE — ED Triage Notes (Signed)
Patient arrives to ED with complaints of blood in his stool x3 since last night. Patient states he felt dizzy this morning, fell and hit his head. Patient states that he had his 2nd COVID shot yesterday. Patient denies abdominal pain but feels bloated.

## 2020-01-18 NOTE — ED Provider Notes (Signed)
[facility] EM[facility]ENCY DEPARTMENT Provider Note   CSN: [id number] Arrival date & time: 01/18/20  1007     History Chief Complaint  Patient presents with  . GI Bleeding    Christian Vance is a 59 y.o. male.  HPI   59 year old male with rectal bleeding.  Onset yesterday evening.  3 total episodes.  Reports dark red blood mixed in with a small amount of stool.  After the second episode he felt lightheaded and thinks he may have had a brief episode of syncope.  Some vague abdominal pain.  No nausea or vomiting.  No blood thinners.  Denies significant past history of GI bleeding.  Previous colonoscopy by Dr. Ardis Hughs.  Past Medical History:  Diagnosis Date  . Allergy   . Diabetes mellitus   . History of hepatitis B     Patient Active Problem List   Diagnosis Date Noted  . Acute GI bleeding 01/18/2020  . Macrocytosis 01/18/2020  . DM (diabetes mellitus), type 2 ([place]) 12/03/2013  . Hepatitis B, chronic ([place]) 12/03/2013   History reviewed. No pertinent surgical history.    Family History  Problem Relation Age of Onset  . Colon cancer Neg Hx     Social History   Tobacco Use  . Smoking status: Never Smoker  . Smokeless tobacco: Never Used  Vaping Use  . Vaping Use: Never used  Substance Use Topics  . Alcohol use: No  . Drug use: No   Home Medications Prior to Admission medications   Medication Sig Start Date End Date Taking? Authorizing Provider  ibuprofen (ADVIL,MOTRIN) 800 MG tablet Take 1 tablet (800 mg total) by mouth 3 (three) times daily. 02/24/15   Alvina Chou, PA-C  metFORMIN (GLUCOPHAGE) 1000 MG tablet Take 1 tablet (1,000 mg total) by mouth 2 (two) times daily with a meal. 09/18/19   [name], [name], MD  Multiple Vitamins-Minerals (CENTRUM ULTRA MENS PO) Take by mouth.    [provider]   Allergies    Patient has no known allergies.  Review of Systems   Review of Systems All systems reviewed and negative, other than  as noted in HPI.  Physical Exam Updated Vital Signs BP 102/86   Pulse (!) 101   Temp 99.8 F (37.7 C) (Oral)   Resp 16   SpO2 99%   Physical Exam Vitals and nursing note reviewed.  Constitutional:      General: He is not in acute distress.    Appearance: He is well-developed.  HENT:     Head: Normocephalic and atraumatic.  Eyes:     General:        Right eye: No discharge.        Left eye: No discharge.     Conjunctiva/sclera: Conjunctivae normal.  Cardiovascular:     Rate and Rhythm: Normal rate and regular rhythm.     Heart sounds: Normal heart sounds. No murmur heard.  No friction rub. No gallop.   Pulmonary:     Effort: Pulmonary effort is normal. No respiratory distress.     Breath sounds: Normal breath sounds.  Abdominal:     General: There is no distension.     Palpations: Abdomen is soft.     Tenderness: There is no abdominal tenderness.  Musculoskeletal:        General: No tenderness.     Cervical back: Neck supple.  Skin:    General: Skin is warm and dry.  Neurological:  Mental Status: He is alert.  Psychiatric:        Behavior: Behavior normal.        Thought Content: Thought content normal.    ED Results / Procedures / Treatments   Labs (all labs ordered are listed, but only abnormal results are displayed) Labs Reviewed  COMPREHENSIVE METABOLIC PANEL - Abnormal; Notable for the following components:      Result Value   Glucose, Bld 118 (*)    Total Protein 6.3 (*)    All other components within normal limits  CBC - Abnormal; Notable for the following components:   RBC 3.35 (*)    Hemoglobin 10.8 (*)    HCT 33.7 (*)    MCV 100.6 (*)    All other components within normal limits  SARS CORONAVIRUS 2 BY RT PCR (HOSPITAL ORDER, [facility])  HIV ANTIBODY (ROUTINE TESTING W REFLEX)  URINALYSIS, COMPLETE (UACMP) WITH MICROSCOPIC  APTT  PROTIME-INR  HEMOGLOBIN A1C  VITAMIN B12  FOLATE  POC OCCULT BLOOD, ED  TYPE AND  SCREEN  ABO/RH    EKG None  Radiology No results found.  Procedures Procedures (including critical care time)  Medications Ordered in ED Medications  acetaminophen (TYLENOL) tablet 650 mg (has no administration in time range)    Or  acetaminophen (TYLENOL) suppository 650 mg (has no administration in time range)  HYDROmorphone (DILAUDID) injection 0.5-1 mg (has no administration in time range)  ondansetron (ZOFRAN) tablet 4 mg (has no administration in time range)    Or  ondansetron (ZOFRAN) injection 4 mg (has no administration in time range)  0.9 %  sodium chloride infusion (has no administration in time range)  insulin aspart (novoLOG) injection 0-15 Units (has no administration in time range)  insulin aspart (novoLOG) injection 0-5 Units (has no administration in time range)  lactated ringers bolus 1,000 mL (0 mLs Intravenous Stopped 01/18/20 1304)    ED Course  I have reviewed the triage vital signs and the nursing notes.  Pertinent labs & imaging results that were available during my care of the patient were reviewed by me and considered in my medical decision making (see chart for details).    MDM Rules/Calculators/A&P                          59 year old male with rectal bleeding.  Gross blood.  Essentially painless.  Not anticoagulated.  Hemoglobin has dropped several grams in just a few weeks. Possible syncope versus near syncope yesterday. Bolus of IVF. T&S. Transfusion deferred at this time. Will admit. Spoke with Azucena Freed, [facility]. They'll see in consultation.   Final Clinical Impression(s) / ED Diagnoses Final diagnoses:  Acute GI bleeding    Rx / DC Orders ED Discharge Orders    None       Virgel Manifold, MD 01/20/20 1531

## 2020-01-18 NOTE — H&P (View-Only) (Signed)
[facility] Consult: 1:28 PM 01/18/2020  LOS: 0 days    Referring Provider: Dr Wilson Singer in ED  Primary Care Physician:  [name], [name], MD Primary Gastroenterologist:  Dr. Ardis Hughs, 2017     Reason for Consultation:  Hematochezia.     HPI: Christian Vance is a 59 y.o. male.  Past history DM 2, takes Glucophage.  Hepatitis B, chronic, inactive, followed by ID clinic here at [facility] who feels there is no indication for treatment as of note of 07/09/2019.  10/2014 ultrasound with elastography, Metavir score F0/F1, minimal risk of fibrosis. 03/2016 Colonoscopy.  Screening study, Dr. Ardis Hughs.  Removed 3 and 10 mm sigmoid polyps (both HP type).  Medium sized, external hemorrhoids.  No mention of diverticulosis. 06/2019 abdominal ultrasound showed mildly inhomogeneous liver echotexture, finding likely due to fatty infiltration.  No focal liver lesions.  No gallstones.  3 mm CBD.  Portal vein patent with normal directional flow on Dopplers.  Yesterday afternoon the patient and his wife received their 2nd Covid vaccination.  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  Around midnight he had his first episode of painless hematochezia.  The second episode was about an hour and a half later and was associated with syncope and a controlled fall down some steps.  After the fall he developed some mild to moderate pain in the left chest which is worse with movement and is not triggered by activity.  No shortness of breath.  Never had abdominal pain.  No nausea.  Leading up to last night's events he had no history of GI issues, no minor rectal bleeding, no change in stool habits.  Does not take aspirin or NSAIDs with any regularity.  Presenting blood pressure 102/86, heart rate 101, 99%  sats on room air. Hgb 10.8, was 14.2 three weeks ago.  MCV 100.  Platelets 172. Other than glucose 118, chemistries, including LFTs, are normal. PT, INR pending.  Pt and his wife are originally from the [facility] but emigrated from [place] in 2005 in order to expand his [employer].  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   No ETOH or tobacco products.    Past Medical History:  Diagnosis Date  . Allergy   . Diabetes mellitus   . History of hepatitis B     History reviewed. No pertinent surgical history.  Prior to Admission medications   Medication Sig Start Date End Date Taking? Authorizing Provider    02/24/15   Alvina Chou, PA-C  metFORMIN (GLUCOPHAGE) 1000 MG tablet Take 1 tablet (1,000 mg total) by mouth 2 (two) times daily with a meal. 09/18/19   [name], [name], MD  Multiple Vitamins-Minerals (CENTRUM ULTRA MENS PO) Take by mouth.    [provider]    Scheduled Meds: . insulin aspart  0-15 Units Subcutaneous TID WC  . insulin aspart  0-5 Units Subcutaneous QHS   Infusions: . sodium chloride    . sodium chloride  PRN Meds: acetaminophen **OR** acetaminophen, HYDROmorphone (DILAUDID) injection, ondansetron **OR** ondansetron (ZOFRAN) IV   Allergies as of 01/18/2020  . (No Known Allergies)    Family History  Problem Relation Age of Onset  . Colon cancer Neg Hx     Social History   Socioeconomic History  . Marital status: Married    Spouse name: [name]  . Number of children: Not on file  . Years of education: Not on file  . Highest education level: Doctorate  Occupational History  . Occupation: Mining engineer  Tobacco Use  . Smoking status: Never Smoker  . Smokeless tobacco: Never Used  Vaping Use  . Vaping Use: Never used  Substance and Sexual Activity  . Alcohol use: No  . Drug use: No  . Sexual activity: Not on file  Other Topics Concern  . Not on file  Social History Narrative   Work or  School: self employed, transportation      Home Situation: lives with wife      Spiritual Beliefs:      Lifestyle: no regular exercise; diet is healthy      Patient is right handed. He lives with his wife in a 1st floor apartment. He drinks 2-3 cups of coffee a day, and ocassionally tea. He does not exercise.         Social Determinants of Health   Financial Resource Strain:   . Difficulty of Paying Living Expenses:   Food Insecurity:   . Worried About Charity fundraiser in the Last Year:   . Arboriculturist in the Last Year:   Transportation Needs:   . Film/video editor (Medical):   Marland Kitchen Lack of Transportation (Non-Medical):   Physical Activity:   . Days of Exercise per Week:   . Minutes of Exercise per Session:   Stress:   . Feeling of Stress :   Social Connections:   . Frequency of Communication with Friends and Family:   . Frequency of Social Gatherings with Friends and Family:   . Attends Religious Services:   . Active Member of Clubs or Organizations:   . Attends Archivist Meetings:   Marland Kitchen Marital Status:   Intimate Partner Violence:   . Fear of Current or Ex-Partner:   . Emotionally Abused:   Marland Kitchen Physically Abused:   . Sexually Abused:     REVIEW OF SYSTEMS: Constitutional: Other than the syncopal spell last night, patient generally does not have issues with weakness, dizziness, fatigue. ENT:  No nose bleeds Pulm: No shortness of breath or cough. CV:  No palpitations, no LE edema.  No angina.  Left-sided chest pain related to movement as described in HPI. GU:  No hematuria, no frequency GI: See HPI Heme: Other than the bleeding overnight, does not have issues with unusual, excessive bleeding or bruising Transfusions: None Neuro: Syncopal spell early this morning as per HPI.  No seizures.  No headaches, no peripheral tingling or numbness Derm:  No itching, no rash or sores.  Endocrine:  No sweats or chills.  No polyuria or dysuria Immunization:  Covid vaccinations completed as of yesterday 6/10.  Hep A vaccination 2015.   PHYSICAL EXAM: Vital signs in last 24 hours: Vitals:   01/18/20 1013  BP: 102/86  Pulse: (!) 101  Resp: 16  Temp: 99.8 F (37.7 C)  SpO2: 99%   Wt Readings from Last 3 Encounters:  07/09/19 77.6 kg  06/21/19 77.2 kg  08/25/18 74.9 kg  General: Pleasant, comfortable, alert.  Does not look ill.  Slight framed but not cachectic.  Speaks English fairly well, though his wife's English is better so she did more of the speaking and answering questions.  He comprehends English well Head: No facial asymmetry or swelling.  No signs of head trauma. Eyes: No conjunctival pallor.  No scleral icterus.  EOMI. Ears: No hearing deficit Nose: No congestion or discharge Mouth: Oropharynx moist, pink, clear.  Excellent dentition.  Tongue midline. Neck: No JVD, no masses, no thyromegaly. Lungs: Clear bilaterally without labored breathing.  No cough. Heart: RRR.  No MRG.  S1, S2 present Abdomen: Soft, nondistended, nontender.  Active bowel sounds.  No HSM, masses, bruits, hernias.   Rectal: Visually inspected rectum and there are no visible hemorrhoids or blood.  Did not perform DRE.  Musc/Skeltl: No joint swelling or significant deformities. Extremities: No CCE. Neurologic: Fully alert and oriented x3.  Moves all 4 limbs without tremor or weakness. Skin: No abrasions, cuts, sores. Nodes: No cervical adenopathy. Psych: Cooperative, calm, pleasant, fluid speech.  Intake/Output from previous day: No intake/output data recorded. Intake/Output this shift: No intake/output data recorded.  LAB RESULTS: Recent Labs    01/18/20 1023  WBC 7.0  HGB 10.8*  HCT 33.7*  PLT 172   BMET Lab Results  Component Value Date   NA 140 01/18/2020   NA 141 06/21/2019   NA 139 07/24/2018   K 4.0 01/18/2020   K 4.1 06/21/2019   K 4.3 07/24/2018   CL 109 01/18/2020   CL 105 06/21/2019   CL 104 07/24/2018   CO2 22  01/18/2020   CO2 30 06/21/2019   CO2 29 07/24/2018   GLUCOSE 118 (H) 01/18/2020   GLUCOSE 97 06/21/2019   GLUCOSE 126 (H) 07/24/2018   BUN 14 01/18/2020   BUN 10 06/21/2019   BUN 10 07/24/2018   CREATININE 0.89 01/18/2020   CREATININE 0.74 06/21/2019   CREATININE 0.72 07/24/2018   CALCIUM 8.9 01/18/2020   CALCIUM 9.5 06/21/2019   CALCIUM 9.8 07/24/2018   LFT Recent Labs    01/18/20 1023  PROT 6.3*  ALBUMIN 3.6  AST 18  ALT 16  ALKPHOS 49  BILITOT 1.1   PT/INR Lab Results  Component Value Date   INR 1.0 04/12/2007    RADIOLOGY STUDIES: No results found.   IMPRESSION:   *   Painless hematochezia. Hyperplastic colon polyps and nonbleeding internal hemorrhoids on colonoscopy in 03/2016.  Although the there is no mention of diverticulosis on the colonoscopy report, the current bleeding is most consistent with diverticular bleed.  Nothing to suggest upper GI bleed such as nausea, abdominal pain, early satiety, elevated BUN.  *    Macrocytic anemia. Hgb drop of nearly 3.5 gm in 3 weeks.     *    Hepatitis B.  Chronic, inactive, no indication for treatment per latest infectious disease office visit note of 06/2019. Fatty liver per ultrasound 06/2019.  LFTs normal.      PLAN:     *  Await PT-INR results.    *   Diet clear liquids.    *   Follow Hgb.    *    Question endoscopic work-up tomorrow?   This could include colonoscopy as well as EGD, though colonoscopy is clearly more indicated given the history..  Will discuss with Dr. Fuller Plan.   Azucena Freed  01/18/2020, 1:28 PM Phone [phone]   Attending Physician Note   I have  taken a history, examined the patient and reviewed the chart. I agree with the Advanced Practitioner's note, impression and recommendations.  Painless hematochezia, anemia. R/O diverticular, AVM, etc. Chronic inactive hepatitis B. HBeAg negative, normal ALT  IVF Trend CBC Clear liquid diet Colonoscopy and possible EGD  tomorrow  Lucio Edward, MD [facility]

## 2020-01-18 NOTE — Consult Note (Addendum)
[facility] Consult: 1:28 PM 01/18/2020  LOS: 0 days    Referring Provider: Dr Wilson Singer in ED  Primary Care Physician:  [name], [name], MD Primary Gastroenterologist:  Dr. Ardis Hughs, 2017     Reason for Consultation:  Hematochezia.     HPI: Christian Vance is a 59 y.o. male.  Past history DM 2, takes Glucophage.  Hepatitis B, chronic, inactive, followed by ID clinic here at [facility] who feels there is no indication for treatment as of note of 07/09/2019.  10/2014 ultrasound with elastography, Metavir score F0/F1, minimal risk of fibrosis. 03/2016 Colonoscopy.  Screening study, Dr. Ardis Hughs.  Removed 3 and 10 mm sigmoid polyps (both HP type).  Medium sized, external hemorrhoids.  No mention of diverticulosis. 06/2019 abdominal ultrasound showed mildly inhomogeneous liver echotexture, finding likely due to fatty infiltration.  No focal liver lesions.  No gallstones.  3 mm CBD.  Portal vein patent with normal directional flow on Dopplers.  Yesterday afternoon the patient and his wife received their 2nd Covid vaccination.  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  Around midnight he had his first episode of painless hematochezia.  The second episode was about an hour and a half later and was associated with syncope and a controlled fall down some steps.  After the fall he developed some mild to moderate pain in the left chest which is worse with movement and is not triggered by activity.  No shortness of breath.  Never had abdominal pain.  No nausea.  Leading up to last night's events he had no history of GI issues, no minor rectal bleeding, no change in stool habits.  Does not take aspirin or NSAIDs with any regularity.  Presenting blood pressure 102/86, heart rate 101, 99%  sats on room air. Hgb 10.8, was 14.2 three weeks ago.  MCV 100.  Platelets 172. Other than glucose 118, chemistries, including LFTs, are normal. PT, INR pending.  Pt and his wife are originally from the [facility] but emigrated from [place] in 2005 in order to expand his [employer].  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   No ETOH or tobacco products.    Past Medical History:  Diagnosis Date   Allergy    Diabetes mellitus    History of hepatitis B     History reviewed. No pertinent surgical history.  Prior to Admission medications   Medication Sig Start Date End Date Taking? Authorizing Provider    02/24/15   Alvina Chou, PA-C  metFORMIN (GLUCOPHAGE) 1000 MG tablet Take 1 tablet (1,000 mg total) by mouth 2 (two) times daily with a meal. 09/18/19   [name], [name], MD  Multiple Vitamins-Minerals (CENTRUM ULTRA MENS PO) Take by mouth.    [provider]    Scheduled Meds:  insulin aspart  0-15 Units Subcutaneous TID WC   insulin aspart  0-5 Units Subcutaneous QHS   Infusions:  sodium chloride     sodium chloride  PRN Meds: acetaminophen **OR** acetaminophen, HYDROmorphone (DILAUDID) injection, ondansetron **OR** ondansetron (ZOFRAN) IV   Allergies as of 01/18/2020   (No Known Allergies)    Family History  Problem Relation Age of Onset   Colon cancer Neg Hx     Social History   Socioeconomic History   Marital status: Married    Spouse name: [name]   Number of children: Not on file   Years of education: Not on file   Highest education level: Doctorate  Occupational History   Occupation: Mining engineer  Tobacco Use   Smoking status: Never Smoker   Smokeless tobacco: Never Used  Scientific laboratory technician Use: Never used  Substance and Sexual Activity   Alcohol use: No   Drug use: No   Sexual activity: Not on file  Other Topics Concern   Not on file  Social History Narrative   Work or  School: self employed, transportation      Home Situation: lives with wife      Spiritual Beliefs:      Lifestyle: no regular exercise; diet is healthy      Patient is right handed. He lives with his wife in a 1st floor apartment. He drinks 2-3 cups of coffee a day, and ocassionally tea. He does not exercise.         Social Determinants of Health   Financial Resource Strain:    Difficulty of Paying Living Expenses:   Food Insecurity:    Worried About Charity fundraiser in the Last Year:    Arboriculturist in the Last Year:   Transportation Needs:    Film/video editor (Medical):    Lack of Transportation (Non-Medical):   Physical Activity:    Days of Exercise per Week:    Minutes of Exercise per Session:   Stress:    Feeling of Stress :   Social Connections:    Frequency of Communication with Friends and Family:    Frequency of Social Gatherings with Friends and Family:    Attends Religious Services:    Active Member of Clubs or Organizations:    Attends Music therapist:    Marital Status:   Intimate Partner Violence:    Fear of Current or Ex-Partner:    Emotionally Abused:    Physically Abused:    Sexually Abused:     REVIEW OF SYSTEMS: Constitutional: Other than the syncopal spell last night, patient generally does not have issues with weakness, dizziness, fatigue. ENT:  No nose bleeds Pulm: No shortness of breath or cough. CV:  No palpitations, no LE edema.  No angina.  Left-sided chest pain related to movement as described in HPI. GU:  No hematuria, no frequency GI: See HPI Heme: Other than the bleeding overnight, does not have issues with unusual, excessive bleeding or bruising Transfusions: None Neuro: Syncopal spell early this morning as per HPI.  No seizures.  No headaches, no peripheral tingling or numbness Derm:  No itching, no rash or sores.  Endocrine:  No sweats or chills.  No polyuria or dysuria Immunization:  Covid vaccinations completed as of yesterday 6/10.  Hep A vaccination 2015.   PHYSICAL EXAM: Vital signs in last 24 hours: Vitals:   01/18/20 1013  BP: 102/86  Pulse: (!) 101  Resp: 16  Temp: 99.8 F (37.7 C)  SpO2: 99%   Wt Readings from Last 3 Encounters:  07/09/19 77.6 kg  06/21/19 77.2 kg  08/25/18 74.9 kg  General: Pleasant, comfortable, alert.  Does not look ill.  Slight framed but not cachectic.  Speaks English fairly well, though his wife's English is better so she did more of the speaking and answering questions.  He comprehends English well Head: No facial asymmetry or swelling.  No signs of head trauma. Eyes: No conjunctival pallor.  No scleral icterus.  EOMI. Ears: No hearing deficit Nose: No congestion or discharge Mouth: Oropharynx moist, pink, clear.  Excellent dentition.  Tongue midline. Neck: No JVD, no masses, no thyromegaly. Lungs: Clear bilaterally without labored breathing.  No cough. Heart: RRR.  No MRG.  S1, S2 present Abdomen: Soft, nondistended, nontender.  Active bowel sounds.  No HSM, masses, bruits, hernias.   Rectal: Visually inspected rectum and there are no visible hemorrhoids or blood.  Did not perform DRE.  Musc/Skeltl: No joint swelling or significant deformities. Extremities: No CCE. Neurologic: Fully alert and oriented x3.  Moves all 4 limbs without tremor or weakness. Skin: No abrasions, cuts, sores. Nodes: No cervical adenopathy. Psych: Cooperative, calm, pleasant, fluid speech.  Intake/Output from previous day: No intake/output data recorded. Intake/Output this shift: No intake/output data recorded.  LAB RESULTS: Recent Labs    01/18/20 1023  WBC 7.0  HGB 10.8*  HCT 33.7*  PLT 172   BMET Lab Results  Component Value Date   NA 140 01/18/2020   NA 141 06/21/2019   NA 139 07/24/2018   K 4.0 01/18/2020   K 4.1 06/21/2019   K 4.3 07/24/2018   CL 109 01/18/2020   CL 105 06/21/2019   CL 104 07/24/2018   CO2 22  01/18/2020   CO2 30 06/21/2019   CO2 29 07/24/2018   GLUCOSE 118 (H) 01/18/2020   GLUCOSE 97 06/21/2019   GLUCOSE 126 (H) 07/24/2018   BUN 14 01/18/2020   BUN 10 06/21/2019   BUN 10 07/24/2018   CREATININE 0.89 01/18/2020   CREATININE 0.74 06/21/2019   CREATININE 0.72 07/24/2018   CALCIUM 8.9 01/18/2020   CALCIUM 9.5 06/21/2019   CALCIUM 9.8 07/24/2018   LFT Recent Labs    01/18/20 1023  PROT 6.3*  ALBUMIN 3.6  AST 18  ALT 16  ALKPHOS 49  BILITOT 1.1   PT/INR Lab Results  Component Value Date   INR 1.0 04/12/2007    RADIOLOGY STUDIES: No results found.   IMPRESSION:   *   Painless hematochezia. Hyperplastic colon polyps and nonbleeding internal hemorrhoids on colonoscopy in 03/2016.  Although the there is no mention of diverticulosis on the colonoscopy report, the current bleeding is most consistent with diverticular bleed.  Nothing to suggest upper GI bleed such as nausea, abdominal pain, early satiety, elevated BUN.  *    Macrocytic anemia. Hgb drop of nearly 3.5 gm in 3 weeks.     *    Hepatitis B.  Chronic, inactive, no indication for treatment per latest infectious disease office visit note of 06/2019. Fatty liver per ultrasound 06/2019.  LFTs normal.      PLAN:     *  Await PT-INR results.    *   Diet clear liquids.    *   Follow Hgb.    *    Question endoscopic work-up tomorrow?   This could include colonoscopy as well as EGD, though colonoscopy is clearly more indicated given the history..  Will discuss with Dr. Fuller Plan.   Azucena Freed  01/18/2020, 1:28 PM Phone [phone]   Attending Physician Note   I have  taken a history, examined the patient and reviewed the chart. I agree with the Advanced Practitioner's note, impression and recommendations.  Painless hematochezia, anemia. R/O diverticular, AVM, etc. Chronic inactive hepatitis B. HBeAg negative, normal ALT  IVF Trend CBC Clear liquid diet Colonoscopy and possible EGD  tomorrow  Lucio Edward, MD [facility]

## 2020-01-18 NOTE — Progress Notes (Signed)
Call from Theodis Aguas, PA to not give Reglan until 1800 or when starting bowel prep today.   Advised she can't get order changed because it's linked to something else in system.

## 2020-01-18 NOTE — Progress Notes (Signed)
Patient arrived on floor at this time and is now being changed into a hospital gown.

## 2020-01-18 NOTE — H&P (Signed)
History and Physical    Christian Vance QMV:[id number] DOB: [birth year] DOA: 01/18/2020  PCP: [name], [name], MD  Patient coming from: Home I have personally briefly reviewed patient's old medical records in [place]  Chief Complaint: Bloody stool  HPI: Christian Vance is a 59 y.o. male with medical history significant of hepatitis B, diabetes mellitus presents to emergency department due to blood per rectum.  Patient tells me that he received second dose of COVID-19 vaccine yesterday and he was doing fine until this morning when he had 3 total episodes of dark red blood mixed with small amount of stool.  He also reports some midsternal chest pain, shortness of breath along with nausea and decreased appetite.  He is denies use of over-the-counter NSAID, blood thinner, history of colon cancer, family history of colon cancer, change in bowel habits, unintentional weight loss, night sweats.  He had colonoscopy couple of years ago which was negative.  No history of GI bleeding in the past.  No history of smoking, alcohol, illicit drug use.  ED Course: Upon arrival to ED patient tachycardic, other vital signs within normal limit.  H&H 10.8/33.7, MCV: 100.6, POC occult blood and COVID-19 pending.  EDP consulted GI.  [facility] hospitalist consulted for admission for GI bleeding.  Review of Systems: As per HPI otherwise negative.    Past Medical History:  Diagnosis Date  . Allergy   . Diabetes mellitus   . Hepatitis B infection without delta agent without hepatic coma    chronic, inactive.  followed by [facility] ID service.      History reviewed. No pertinent surgical history.   reports that he has never smoked. He has never used smokeless tobacco. He reports that he does not drink alcohol and does not use drugs.  No Known Allergies  Family History  Problem Relation Age of Onset  . Colon cancer Neg Hx     Prior to Admission medications   Medication Sig Start Date End Date Taking?  Authorizing Provider  ibuprofen (ADVIL,MOTRIN) 800 MG tablet Take 1 tablet (800 mg total) by mouth 3 (three) times daily. 02/24/15   Alvina Chou, PA-C  metFORMIN (GLUCOPHAGE) 1000 MG tablet Take 1 tablet (1,000 mg total) by mouth 2 (two) times daily with a meal. 09/18/19   [name], [name], MD  Multiple Vitamins-Minerals (CENTRUM ULTRA MENS PO) Take by mouth.    [provider]    Physical Exam: Vitals:   01/18/20 1013 01/18/20 1420 01/18/20 1549  BP: 102/86 108/84 106/67  Pulse: (!) 101 79 75  Resp: 16 15 15   Temp: 99.8 F (37.7 C)  98.4 F (36.9 C)  TempSrc: Oral  Oral  SpO2: 99% 99%   Height:   5\' 6"  (1.676 m)    Constitutional: NAD, calm, comfortable, communicating well Eyes: PERRL, lids and conjunctivae normal ENMT: Mucous membranes are moist. Posterior pharynx clear of any exudate or lesions.Normal dentition.  Neck: normal, supple, no masses, no thyromegaly Respiratory: clear to auscultation bilaterally, no wheezing, no crackles. Normal respiratory effort. No accessory muscle use.  Cardiovascular: Regular rate and rhythm, no murmurs / rubs / gallops. No extremity edema. 2+ pedal pulses. No carotid bruits.  Abdomen: no tenderness, no masses palpated. No hepatosplenomegaly. Bowel sounds positive.  Musculoskeletal: no clubbing / cyanosis. No joint deformity upper and lower extremities. Good ROM, no contractures. Normal muscle tone.  Skin: no rashes, lesions, ulcers. No induration Neurologic: CN 2-12 grossly intact. Sensation intact, DTR normal. Strength  5/5 in all 4.  Psychiatric: Normal judgment and insight. Alert and oriented x 3. Normal mood.    Labs on Admission: I have personally reviewed following labs and imaging studies  CBC: Recent Labs  Lab 01/18/20 1023  WBC 7.0  HGB 10.8*  HCT 33.7*  MCV 100.6*  PLT 063   Basic Metabolic Panel: Recent Labs  Lab 01/18/20 1023  NA 140  K 4.0  CL 109  CO2 22  GLUCOSE 118*  BUN 14  CREATININE  0.89  CALCIUM 8.9   GFR: CrCl cannot be calculated (Unknown ideal weight.). Liver Function Tests: Recent Labs  Lab 01/18/20 1023  AST 18  ALT 16  ALKPHOS 49  BILITOT 1.1  PROT 6.3*  ALBUMIN 3.6   No results for input(s): LIPASE, AMYLASE in the last 168 hours. No results for input(s): AMMONIA in the last 168 hours. Coagulation Profile: Recent Labs  Lab 01/18/20 1522  INR 1.2   Cardiac Enzymes: No results for input(s): CKTOTAL, CKMB, CKMBINDEX, TROPONINI in the last 168 hours. BNP (last 3 results) No results for input(s): PROBNP in the last 8760 hours. HbA1C: Recent Labs    01/18/20 1415  HGBA1C 6.2*   CBG: No results for input(s): GLUCAP in the last 168 hours. Lipid Profile: No results for input(s): CHOL, HDL, LDLCALC, TRIG, CHOLHDL, LDLDIRECT in the last 72 hours. Thyroid Function Tests: No results for input(s): TSH, T4TOTAL, FREET4, T3FREE, THYROIDAB in the last 72 hours. Anemia Panel: No results for input(s): VITAMINB12, FOLATE, FERRITIN, TIBC, IRON, RETICCTPCT in the last 72 hours. Urine analysis:    Component Value Date/Time   COLORURINE STRAW (A) 01/18/2020 1500   APPEARANCEUR CLEAR 01/18/2020 1500   LABSPEC 1.008 01/18/2020 1500   PHURINE 5.0 01/18/2020 1500   GLUCOSEU NEGATIVE 01/18/2020 1500   GLUCOSEU >=1000 (A) 10/15/2016 1441   HGBUR NEGATIVE 01/18/2020 1500   BILIRUBINUR NEGATIVE 01/18/2020 1500   KETONESUR NEGATIVE 01/18/2020 1500   PROTEINUR NEGATIVE 01/18/2020 1500   UROBILINOGEN 1.0 10/15/2016 1441   NITRITE NEGATIVE 01/18/2020 1500   LEUKOCYTESUR NEGATIVE 01/18/2020 1500    Radiological Exams on Admission: No results found.  EKG: Independently reviewed.  Normal sinus rhythm, normal axis, minimal ST elevation in anterior leads.  Assessment/Plan Principal Problem:   Acute GI bleeding Active Problems:   DM (diabetes mellitus), type 2 (HCC)   Hepatitis B, chronic (HCC)   Macrocytosis   Acute GI bleeding/acute blood loss  anemia: -Patient presented with bright red blood per rectum-3 episodes.  His hemoglobin dropped from 14.2/43.1 to 10.8/33.7 in 3 weeks -Slightly tachycardic, other vital signs stable.  He is afebrile with no leukocytosis.  COVID-19 pending. -Admit patient on the floor for close monitoring. -Monitor H&H and vitals closely.  Transfuse as needed. -We will keep her n.p.o. for now. -Consulted GI-await recommendation.  Macrocytosis: MCV: 100.6 -Check B12 and folate  Diabetes mellitus type 2: Hold Metformin for now -Started on sliding scale insulin.  Check A1c.  Monitor blood sugar closely  Chronic hepatitis B: Followed by infectious disease outpatient -Liver function within normal limit.  Check PT/INR. -Reviewed notes.  Currently not on any treatment.  DVT prophylaxis: TED/SCD Code Status: Full code Family Communication: Patient's wife present at bedside.  Plan of care discussed with patient and his wife in length and they verbalized understanding and agreed with it. Disposition Plan: To be determined Consults called: GI by EDP  admission status: Inpatient   Mckinley Jewel MD [facility]  If 7PM-7AM, please contact night-coverage  [email or web address] Password [facility]  01/18/2020, 4:01 PM

## 2020-01-18 NOTE — Progress Notes (Signed)
Report received from Page, RN (ER)

## 2020-01-18 NOTE — Progress Notes (Signed)
Called pharmacy [facility]) and asked about the MOVIPREP, they advised that the pharmacy tech was bringing this up to floor because they cannot tube this up.

## 2020-01-18 NOTE — ED Notes (Signed)
Called pt for ready room Pt did not respond in lobby with being called 2X. RN notified

## 2020-01-19 ENCOUNTER — Encounter (HOSPITAL_COMMUNITY)
Admission: EM | Disposition: A | Discharge status: Home / Self Care | Payer: Self-pay | Source: Home / Self Care | Attending: Internal Medicine

## 2020-01-19 ENCOUNTER — Inpatient Hospital Stay (HOSPITAL_COMMUNITY): Payer: 59 | Admitting: Certified Registered Nurse Anesthetist

## 2020-01-19 ENCOUNTER — Encounter (HOSPITAL_COMMUNITY): Payer: Self-pay | Admitting: Internal Medicine

## 2020-01-19 DIAGNOSIS — D127 Benign neoplasm of rectosigmoid junction: Secondary | ICD-10-CM

## 2020-01-19 DIAGNOSIS — K5520 Angiodysplasia of colon without hemorrhage: Secondary | ICD-10-CM

## 2020-01-19 DIAGNOSIS — E119 Type 2 diabetes mellitus without complications: Secondary | ICD-10-CM

## 2020-01-19 DIAGNOSIS — D7589 Other specified diseases of blood and blood-forming organs: Secondary | ICD-10-CM

## 2020-01-19 DIAGNOSIS — K3189 Other diseases of stomach and duodenum: Secondary | ICD-10-CM

## 2020-01-19 DIAGNOSIS — K921 Melena: Secondary | ICD-10-CM

## 2020-01-19 HISTORY — PX: HOT HEMOSTASIS: SHX5433

## 2020-01-19 HISTORY — PX: POLYPECTOMY: SHX5525

## 2020-01-19 HISTORY — PX: ESOPHAGOGASTRODUODENOSCOPY (EGD) WITH PROPOFOL: SHX5813

## 2020-01-19 HISTORY — PX: COLONOSCOPY WITH PROPOFOL: SHX5780

## 2020-01-19 HISTORY — PX: BIOPSY: SHX5522

## 2020-01-19 LAB — CBC
HCT: 27.9 % — ABNORMAL LOW (ref 39.0–52.0)
HCT: 26.8 % — ABNORMAL LOW (ref 39.0–52.0)
Hemoglobin: 9.1 g/dL — ABNORMAL LOW (ref 13.0–17.0)
Hemoglobin: 8.5 g/dL — ABNORMAL LOW (ref 13.0–17.0)
MCH: 32.9 pg (ref 26.0–34.0)
MCH: 32.3 pg (ref 26.0–34.0)
MCHC: 32.6 g/dL (ref 30.0–36.0)
MCHC: 31.7 g/dL (ref 30.0–36.0)
MCV: 100.7 fL — ABNORMAL HIGH (ref 80.0–100.0)
MCV: 101.9 fL — ABNORMAL HIGH (ref 80.0–100.0)
Platelets: 150 10*3/uL (ref 150–400)
Platelets: 131 10*3/uL — ABNORMAL LOW (ref 150–400)
RBC: 2.77 MIL/uL — ABNORMAL LOW (ref 4.22–5.81)
RBC: 2.63 MIL/uL — ABNORMAL LOW (ref 4.22–5.81)
RDW: 13.2 % (ref 11.5–15.5)
RDW: 13.4 % (ref 11.5–15.5)
WBC: 5.1 10*3/uL (ref 4.0–10.5)
WBC: 5.7 10*3/uL (ref 4.0–10.5)
nRBC: 0.4 % — ABNORMAL HIGH (ref 0.0–0.2)
nRBC: 0 % (ref 0.0–0.2)

## 2020-01-19 LAB — COMPREHENSIVE METABOLIC PANEL
ALT: 18 U/L (ref 0–44)
AST: 17 U/L (ref 15–41)
Albumin: 3.1 g/dL — ABNORMAL LOW (ref 3.5–5.0)
Alkaline Phosphatase: 41 U/L (ref 38–126)
Anion gap: 6 (ref 5–15)
BUN: 10 mg/dL (ref 8–23)
CO2: 24 mmol/L (ref 22–32)
Calcium: 8.2 mg/dL — ABNORMAL LOW (ref 8.9–10.3)
Chloride: 113 mmol/L — ABNORMAL HIGH (ref 98–111)
Creatinine, Ser: 0.9 mg/dL (ref 0.61–1.24)
GFR calc Af Amer: >60 mL/min (ref >60)
GFR calc non Af Amer: >60 mL/min (ref >60)
Glucose, Bld: 103 mg/dL — ABNORMAL HIGH (ref 70–99)
Potassium: 3.9 mmol/L (ref 3.5–5.1)
Sodium: 143 mmol/L (ref 135–145)
Total Bilirubin: 0.7 mg/dL (ref 0.3–1.2)
Total Protein: 5.5 g/dL — ABNORMAL LOW (ref 6.5–8.1)

## 2020-01-19 LAB — GLUCOSE, CAPILLARY
Glucose-Capillary: 74 mg/dL (ref 70–99)
Glucose-Capillary: 73 mg/dL (ref 70–99)
Glucose-Capillary: 76 mg/dL (ref 70–99)
Glucose-Capillary: 206 mg/dL — ABNORMAL HIGH (ref 70–99)
Glucose-Capillary: 119 mg/dL — ABNORMAL HIGH (ref 70–99)

## 2020-01-19 SURGERY — COLONOSCOPY WITH PROPOFOL
Anesthesia: Monitor Anesthesia Care

## 2020-01-19 MED ORDER — PANTOPRAZOLE SODIUM 40 MG PO TBEC
40.0000 mg | DELAYED_RELEASE_TABLET | Freq: Every day | ORAL | Status: DC
  Administered 2020-01-20 – 2020-01-21 (×2): 40 mg via ORAL
  Filled 2020-01-19 (×2): qty 1

## 2020-01-19 MED ORDER — PHENYLEPHRINE 40 MCG/ML (10ML) SYRINGE FOR IV PUSH (FOR BLOOD PRESSURE SUPPORT)
PREFILLED_SYRINGE | INTRAVENOUS | Status: DC | PRN
  Administered 2020-01-19 (×2): 80 ug via INTRAVENOUS

## 2020-01-19 MED ORDER — PROPOFOL 10 MG/ML IV BOLUS
INTRAVENOUS | Status: DC | PRN
  Administered 2020-01-19: 20 mg via INTRAVENOUS
  Administered 2020-01-19 (×2): 10 mg via INTRAVENOUS

## 2020-01-19 MED ORDER — PROPOFOL 500 MG/50ML IV EMUL
INTRAVENOUS | Status: DC | PRN
  Administered 2020-01-19: 100 ug/kg/min via INTRAVENOUS

## 2020-01-19 SURGICAL SUPPLY — 25 items

## 2020-01-19 NOTE — Transfer of Care (Signed)
Immediate Anesthesia Transfer of Care Note  Patient: Ether Griffins  Procedure(s) Performed: COLONOSCOPY WITH PROPOFOL (N/A ) ESOPHAGOGASTRODUODENOSCOPY (EGD) WITH PROPOFOL (N/A ) HOT HEMOSTASIS (ARGON PLASMA COAGULATION/BICAP) (N/A ) POLYPECTOMY BIOPSY  Patient Location: PACU  Anesthesia Type:MAC  Level of Consciousness: awake, alert  and oriented  Airway & Oxygen Therapy: Patient Spontanous Breathing and Patient connected to nasal cannula oxygen  Post-op Assessment: Report given to RN and Post -op Vital signs reviewed and stable  Post vital signs: Reviewed and stable  Last Vitals:  Vitals Value Taken Time  BP 109/74 01/19/20 1227  Temp    Pulse 78 01/19/20 1229  Resp 16 01/19/20 1229  SpO2 100 % 01/19/20 1229  Vitals shown include unvalidated device data.  Last Pain:  Vitals:   01/19/20 1113  TempSrc: Oral  PainSc: 2       Patients Stated Pain Goal: 0 (38/10/17 5102)  Complications: No complications documented.

## 2020-01-19 NOTE — Progress Notes (Signed)
Patient MEWS score is now Green.  Call from Endo stating they will be up to get patient shortly.

## 2020-01-19 NOTE — Progress Notes (Signed)
Patient back from Endo and in no distress. Patient resting comfortably.

## 2020-01-19 NOTE — Op Note (Signed)
[facility] Patient Name: Christian Vance Procedure Date : 01/19/2020 MRN: [id number] Attending MD: Ladene Artist , MD Date of Birth: [birth year] CSN: [id number] Age: 59 Admit Type: Inpatient Procedure:                Upper GI endoscopy Indications:              Hematochezia Providers:                Pricilla Riffle. Fuller Plan, MD, Jeanella Cara, RN,                            Elspeth Cho Tech., Technician, Clearnce Sorrel, CRNA Referring MD:             [facility] Medicines:                Monitored Anesthesia Care Complications:            No immediate complications. Estimated Blood Loss:     Estimated blood loss was minimal. Procedure:                Pre-Anesthesia Assessment:                           - Prior to the procedure, a History and Physical                            was performed, and patient medications and                            allergies were reviewed. The patient's tolerance of                            previous anesthesia was also reviewed. The risks                            and benefits of the procedure and the sedation                            options and risks were discussed with the patient.                            All questions were answered, and informed consent                            was obtained. Prior Anticoagulants: The patient has                            taken no previous anticoagulant or antiplatelet                            agents. ASA Grade Assessment: II - A patient with                            mild systemic disease. After reviewing the risks  and benefits, the patient was deemed in                            satisfactory condition to undergo the procedure.                           - Prior to the procedure, a History and Physical                            was performed, and patient medications and                            allergies were reviewed. The patient's tolerance of                             previous anesthesia was also reviewed. The risks                            and benefits of the procedure and the sedation                            options and risks were discussed with the patient.                            All questions were answered, and informed consent                            was obtained. Prior Anticoagulants: The patient has                            taken no previous anticoagulant or antiplatelet                            agents. ASA Grade Assessment: II - A patient with                            mild systemic disease. After reviewing the risks                            and benefits, the patient was deemed in                            satisfactory condition to undergo the procedure.                           After obtaining informed consent, the endoscope was                            passed under direct vision. Throughout the                            procedure, the patient's blood pressure, pulse, and  oxygen saturations were monitored continuously. The                            GIF-H190 ([id number]) Olympus gastroscope was                            introduced through the mouth, and advanced to the                            second part of duodenum. The upper GI endoscopy was                            accomplished without difficulty. The patient                            tolerated the procedure well. Scope In: Scope Out: Findings:      The examined esophagus was normal.      A single localized medium erosion with no bleeding and no stigmata of       recent bleeding was found on the greater curvature of the stomach.       Biopsies were taken with a cold forceps for histology.      Patchy mildly erythematous mucosa without bleeding was found in the       gastric body. Biopsies were taken with a cold forceps for histology.      The exam of the stomach was otherwise normal.      The duodenal bulb and second portion of the  duodenum were normal. Impression:               - Normal esophagus.                           - Erosive gastropathy with no bleeding and no                            stigmata of recent bleeding. Biopsied.                           - Erythematous mucosa in the gastric body. Biopsied.                           - Normal duodenal bulb and second portion of the                            duodenum. Recommendation:           - Return patient to hospital ward for ongoing care.                           - Resume previous diet.                           - Continue present medications.                           - No aspirin, ibuprofen, naproxen, or other  non-steroidal anti-inflammatory drugs.                           - Await pathology results.                           - Protonix (pantoprazole) 40 mg PO daily for 2                            months.                           - OK for discharge later today from GI standpoint.                           - Return to GI office with Dr. Ardis Hughs in 1 month. Procedure Code(s):        --- Professional ---                           [phone], Esophagogastroduodenoscopy, flexible,                            transoral; with biopsy, single or multiple Diagnosis Code(s):        --- Professional ---                           K31.89, Other diseases of stomach and duodenum                           K92.1, Melena (includes Hematochezia) CPT copyright 2019 [employer]. All rights reserved. The codes documented in this report are preliminary and upon coder review may  be revised to meet current compliance requirements. Ladene Artist, MD 01/19/2020 12:31:12 PM This report has been signed electronically. Number of Addenda: 0

## 2020-01-19 NOTE — Progress Notes (Signed)
PROGRESS NOTE        PATIENT DETAILS Name: Christian Vance Age: 59 y.o. Sex: male Date of Birth: 1961/07/31 Admit Date: 01/18/2020 Admitting Physician Mckinley Jewel, MD OZH:[id number] Everardo Beals, MD  Brief Narrative: Patient is a 59 y.o. male with past medical history of hepatitis B, DM-2-who presented with painless hematochezia and acute blood loss anemia.  Significant events: 6/11>> admit to [facility] for lower GI bleeding with acute blood loss anemia.  Significant studies:   Antimicrobial therapy: None  Microbiology data: None  Procedures : None  Consults: GI  DVT Prophylaxis : SCD's  Subjective: Clear bloody output with colonoscopy prep.  Did have several rounds of hematochezia post admission.  Assessment/Plan: Lower GI bleeding with acute blood loss anemia: Hematochezia continues-per patient-had bloody output with colonoscopy prep-he had several rounds of hematochezia post admission.  GI following with plans for EGD/colonoscopy today.  Plans are to follow CBC in transfuse accordingly, if he continues to have significant hematochezia-we will likely require tagged RBC scan at some point.  Lightheadedness/presyncope/syncope/exertional dyspnea: All likely secondary to acute blood loss anemia-continue to follow CBC.  History of hepatitis B: Followed at the ID clinic-currently not on any treatment.  Macrocytosis: Vitamin B12/folate level within normal limits-stable for outpatient follow-up.  DM-2: CBG stable-continue SSI  Recent Labs    01/18/20 1629  GLUCAP 82    Diet: Diet Order            Diet NPO time specified  Diet effective 0500 tomorrow                  Code Status: Full code  Family Communication: None at bedside-we will update family-once endoscopic evaluation has been completed.  Disposition Plan: Status is: Inpatient  Remains inpatient appropriate because:Inpatient level of care appropriate due to severity of  illness  Dispo: The patient is from: Home              Anticipated d/c is to: Home              Anticipated d/c date is: 2 days              Patient currently is not medically stable to d/c.  Barriers to Discharge: Ongoing GI bleeding-with plans for endoscopic evaluation later today-plans to follow CBC frequently-May require PRBC transfusion.  Antimicrobial agents: Anti-infectives (From admission, onward)   None       Time spent: 35 minutes-Greater than 50% of this time was spent in counseling, explanation of diagnosis, planning of further management, and coordination of care.  MEDICATIONS: Scheduled Meds: . insulin aspart  0-15 Units Subcutaneous TID WC  . insulin aspart  0-5 Units Subcutaneous QHS   Continuous Infusions: . sodium chloride 75 mL/hr at 01/19/20 0402   PRN Meds:.acetaminophen **OR** acetaminophen, HYDROmorphone (DILAUDID) injection, ondansetron **OR** ondansetron (ZOFRAN) IV   PHYSICAL EXAM: Vital signs: Vitals:   01/19/20 0538 01/19/20 0612 01/19/20 0742 01/19/20 0900  BP: 91/60 (!) 93/59 108/66 105/62  Pulse: (!) 108 (!) 102 98 97  Resp: 18 18 14 14   Temp: (!) 100.4 F (38 C) 100 F (37.8 C) 99.2 F (37.3 C) 98.5 F (36.9 C)  TempSrc: Oral Oral Oral Oral  SpO2: 95% 98%    Weight:      Height:       Filed Weights   01/18/20  1549 01/19/20 0535  Weight: 78.6 kg 75.3 kg   Body mass index is 26.79 kg/m.   Gen Exam:Alert awake-not in any distress HEENT:atraumatic, normocephalic Chest: B/L clear to auscultation anteriorly CVS:S1S2 regular Abdomen:soft non tender, non distended Extremities:no edema Neurology: Non focal Skin: no rash  I have personally reviewed following labs and imaging studies  LABORATORY DATA: CBC: Recent Labs  Lab 01/18/20 1023 01/18/20 1845 01/19/20 0309  WBC 7.0 6.0 5.1  NEUTROABS  --  2.7  --   HGB 10.8* 9.3* 9.1*  HCT 33.7* 28.0* 27.9*  MCV 100.6* 99.6 100.7*  PLT 172 134* 540    Basic Metabolic  Panel: Recent Labs  Lab 01/18/20 1023 01/19/20 0309  NA 140 143  K 4.0 3.9  CL 109 113*  CO2 22 24  GLUCOSE 118* 103*  BUN 14 10  CREATININE 0.89 0.90  CALCIUM 8.9 8.2*    GFR: Estimated Creatinine Clearance: 77.8 mL/min (by C-G formula based on SCr of 0.9 mg/dL).  Liver Function Tests: Recent Labs  Lab 01/18/20 1023 01/19/20 0309  AST 18 17  ALT 16 18  ALKPHOS 49 41  BILITOT 1.1 0.7  PROT 6.3* 5.5*  ALBUMIN 3.6 3.1*   No results for input(s): LIPASE, AMYLASE in the last 168 hours. No results for input(s): AMMONIA in the last 168 hours.  Coagulation Profile: Recent Labs  Lab 01/18/20 1522  INR 1.2    Cardiac Enzymes: No results for input(s): CKTOTAL, CKMB, CKMBINDEX, TROPONINI in the last 168 hours.  BNP (last 3 results) No results for input(s): PROBNP in the last 8760 hours.  Lipid Profile: No results for input(s): CHOL, HDL, LDLCALC, TRIG, CHOLHDL, LDLDIRECT in the last 72 hours.  Thyroid Function Tests: No results for input(s): TSH, T4TOTAL, FREET4, T3FREE, THYROIDAB in the last 72 hours.  Anemia Panel: Recent Labs    01/18/20 1410  VITAMINB12 301  FOLATE 29.6    Urine analysis:    Component Value Date/Time   COLORURINE STRAW (A) 01/18/2020 1500   APPEARANCEUR CLEAR 01/18/2020 1500   LABSPEC 1.008 01/18/2020 1500   PHURINE 5.0 01/18/2020 1500   GLUCOSEU NEGATIVE 01/18/2020 1500   GLUCOSEU >=1000 (A) 10/15/2016 1441   HGBUR NEGATIVE 01/18/2020 1500   BILIRUBINUR NEGATIVE 01/18/2020 1500   KETONESUR NEGATIVE 01/18/2020 1500   PROTEINUR NEGATIVE 01/18/2020 1500   UROBILINOGEN 1.0 10/15/2016 1441   NITRITE NEGATIVE 01/18/2020 1500   LEUKOCYTESUR NEGATIVE 01/18/2020 1500    Sepsis Labs: Lactic Acid, Venous No results found for: LATICACIDVEN  MICROBIOLOGY: Recent Results (from the past 240 hour(s))  SARS Coronavirus 2 by RT PCR (hospital order, performed in [place] hospital lab) Nasopharyngeal Nasopharyngeal Swab     Status: None    Collection Time: 01/18/20 12:40 PM   Specimen: Nasopharyngeal Swab  Result Value Ref Range Status   SARS Coronavirus 2 NEGATIVE NEGATIVE Final    Comment: (NOTE) SARS-CoV-2 target nucleic acids are NOT DETECTED.  The SARS-CoV-2 RNA is generally detectable in upper and lower respiratory specimens during the acute phase of infection. The lowest concentration of SARS-CoV-2 viral copies this assay can detect is 250 copies / mL. A negative result does not preclude SARS-CoV-2 infection and should not be used as the sole basis for treatment or other patient management decisions.  A negative result may occur with improper specimen collection / handling, submission of specimen other than nasopharyngeal swab, presence of viral mutation(s) within the areas targeted by this assay, and inadequate number of viral copies (<250 copies /  mL). A negative result must be combined with clinical observations, patient history, and epidemiological information.  Fact Sheet for Patients:   StrictlyIdeas.no  Fact Sheet for Healthcare Providers: BankingDealers.co.za  This test is not yet approved or  cleared by the [place] FDA and has been authorized for detection and/or diagnosis of SARS-CoV-2 by FDA under an Emergency Use Authorization (EUA).  This EUA will remain in effect (meaning this test can be used) for the duration of the COVID-19 declaration under Section 564(b)(1) of the Act, 21 U.S.C. section 360bbb-3(b)(1), unless the authorization is terminated or revoked sooner.  Performed at [facility], [place] [address], [place], [place] [address]     RADIOLOGY STUDIES/RESULTS: No results found.   LOS: 1 day   Oren Binet, MD  [facility]    To contact the attending provider between 7A-7P or the covering provider during after hours 7P-7A, please log into the web site [email or web address] and access using universal [place] password  for that web site. If you do not have the password, please call the hospital operator.  01/19/2020, 10:52 AM

## 2020-01-19 NOTE — Op Note (Signed)
[facility] Patient Name: Christian Vance Procedure Date : 01/19/2020 MRN: [id number] Attending MD: Ladene Artist , MD Date of Birth: 11-Aug-1960 CSN: [id number] Age: 59 Admit Type: Inpatient Procedure:                Colonoscopy Indications:               Providers:                Pricilla Riffle. Fuller Plan, MD, Jeanella Cara, RN,                            Elspeth Cho Tech., Technician, Clearnce Sorrel, CRNA Referring MD:             [facility] Medicines:                Monitored Anesthesia Care Complications:            No immediate complications. Estimated blood loss:                            None. Estimated Blood Loss:     Estimated blood loss: none. Procedure:                Pre-Anesthesia Assessment:                           - Prior to the procedure, a History and Physical                            was performed, and patient medications and                            allergies were reviewed. The patient's tolerance of                            previous anesthesia was also reviewed. The risks                            and benefits of the procedure and the sedation                            options and risks were discussed with the patient.                            All questions were answered, and informed consent                            was obtained. Prior Anticoagulants: The patient has                            taken no previous anticoagulant or antiplatelet                            agents. ASA Grade Assessment: II - A patient with  mild systemic disease. After reviewing the risks                            and benefits, the patient was deemed in                            satisfactory condition to undergo the procedure.                           After obtaining informed consent, the colonoscope                            was passed under direct vision. Throughout the                            procedure, the patient's blood pressure,  pulse, and                            oxygen saturations were monitored continuously. The                            CF-HQ190L ([id number]) Olympus colonoscope was                            introduced through the anus and advanced to the the                            terminal ileum, with identification of the                            appendiceal orifice and IC valve. The terminal                            ileum, ileocecal valve, appendiceal orifice, and                            rectum were photographed. The quality of the bowel                            preparation was adequate after extensive lavage and                            suction. The colonoscopy was performed without                            difficulty. The patient tolerated the procedure                            well. Scope In: 11:38:09 AM Scope Out: 12:07:51 PM Scope Withdrawal Time: 0 hours 25 minutes 4 seconds  Total Procedure Duration: 0 hours 29 minutes 42 seconds  Findings:      The perianal and digital rectal examinations were normal. Pertinent       negatives include normal sphincter tone.      The terminal  ileum appeared normal.      A single small localized possible but not definite angioectasia without       bleeding was found in the cecum. Coagulation for bleeding prevention       using argon plasma was successful.      Four sessile polyps were found in the recto-sigmoid colon. The polyps       were 6 to 7 mm in size. These polyps were removed with a cold snare.       Resection and retrieval were complete.      A few small-mouthed diverticula were found scattered in the left colon       and right colon.      Internal hemorrhoids were found during retroflexion. The hemorrhoids       were medium-sized and Grade I (internal hemorrhoids that do not       prolapse).      The exam was otherwise without abnormality on direct and retroflexion       views. Impression:               - The examined portion of the  ileum was normal.                           - A single non-bleeding possible colonic                            angioectasia. Treated with argon plasma coagulation                            (APC).                           - Four 6 to 7 mm polyps at the recto-sigmoid colon,                            removed with a cold snare. Resected and retrieved.                           - Mild scattered diverticulosis in the left colon                            and in the right colon.                           - Internal hemorrhoids.                           - The examination was otherwise normal on direct                            and retroflexion views. Recommendation:           - Repeat colonoscopy after studies are complete for                            surveillance based on pathology results with Dr.  Ardis Hughs.                           - Patient has a contact number available for                            emergencies. The signs and symptoms of potential                            delayed complications were discussed with the                            patient. Return to normal activities tomorrow.                            Written discharge instructions were provided to the                            patient.                           - High fiber diet.                           - Continue present medications.                           - Await pathology results.                           - Suspect bleeding is from diverticulosis, less                            likely from possible AVM. EGD today. Procedure Code(s):        --- Professional ---                           [phone], 59, Colonoscopy, flexible; with control of                            bleeding, any method                           45385, Colonoscopy, flexible; with removal of                            tumor(s), polyp(s), or other lesion(s) by snare                            technique Diagnosis  Code(s):        --- Professional ---                           K64.0, First degree hemorrhoids                           K55.20, Angiodysplasia of colon without hemorrhage  K63.5, Polyp of colon                           K57.30, Diverticulosis of large intestine without                            perforation or abscess without bleeding CPT copyright 2019 American Medical Association. All rights reserved. The codes documented in this report are preliminary and upon coder review may  be revised to meet current compliance requirements. Ladene Artist, MD 01/19/2020 12:25:29 PM This report has been signed electronically. Number of Addenda: 0

## 2020-01-19 NOTE — Progress Notes (Signed)
Patient's VS MEWs turned to yellow for temp 100.4 and HR 104. Discussed conditions with charge Nurse. No acute distress. Patient is alert and oriented x4. Will continue monitor.

## 2020-01-19 NOTE — Interval H&P Note (Signed)
History and Physical Interval Note:  01/19/2020 11:20 AM  Christian Vance  has presented today for surgery, with the diagnosis of To evaluate and treat for source of intestinal bleeding.  The various methods of treatment have been discussed with the patient and family. After consideration of risks, benefits and other options for treatment, the patient has consented to  Procedure(s): COLONOSCOPY WITH PROPOFOL (N/A) ESOPHAGOGASTRODUODENOSCOPY (EGD) WITH PROPOFOL (N/A) as a surgical intervention.  The patient's history has been reviewed, patient examined, no change in status, stable for surgery.  I have reviewed the patient's chart and labs.  Questions were answered to the patient's satisfaction.     Pricilla Riffle. Fuller Plan

## 2020-01-19 NOTE — Progress Notes (Signed)
Patient is currently being transported to Endo at this time for procedures.

## 2020-01-19 NOTE — Anesthesia Preprocedure Evaluation (Signed)
Anesthesia Evaluation  Patient identified by MRN, date of birth, ID band Patient awake    Reviewed: Allergy & Precautions, NPO status , Patient's Chart, lab work & pertinent test results  Airway Mallampati: II  TM Distance: >3 FB Neck ROM: Full    Dental  (+) Teeth Intact, Dental Advisory Given   Pulmonary    breath sounds clear to auscultation       Cardiovascular  Rhythm:Regular Rate:Normal     Neuro/Psych    GI/Hepatic   Endo/Other  diabetes  Renal/GU      Musculoskeletal   Abdominal   Peds  Hematology   Anesthesia Other Findings   Reproductive/Obstetrics                             Anesthesia Physical Anesthesia Plan  ASA: III  Anesthesia Plan: MAC   Post-op Pain Management:    Induction: Intravenous  PONV Risk Score and Plan: Propofol infusion  Airway Management Planned: Natural Airway and Simple Face Mask  Additional Equipment:   Intra-op Plan:   Post-operative Plan:   Informed Consent: I have reviewed the patients History and Physical, chart, labs and discussed the procedure including the risks, benefits and alternatives for the proposed anesthesia with the patient or authorized representative who has indicated his/her understanding and acceptance.       Plan Discussed with: Anesthesiologist and CRNA  Anesthesia Plan Comments:         Anesthesia Quick Evaluation

## 2020-01-19 NOTE — Progress Notes (Signed)
Patient vitals MEWS is now to [place] with his heart rate and temperature within limits.  Patient remains in no distress and is alert and oriented x4. Patient states that he continues to feel like he has to have a bowel movement but he feels like it would still be bloody and does not feel like he has to have a regular bowel movement though.

## 2020-01-20 ENCOUNTER — Encounter (HOSPITAL_COMMUNITY): Payer: Self-pay | Admitting: Gastroenterology

## 2020-01-20 DIAGNOSIS — D62 Acute posthemorrhagic anemia: Secondary | ICD-10-CM

## 2020-01-20 LAB — BASIC METABOLIC PANEL
Anion gap: 4 — ABNORMAL LOW (ref 5–15)
BUN: 5 mg/dL — ABNORMAL LOW (ref 8–23)
CO2: 25 mmol/L (ref 22–32)
Calcium: 7.9 mg/dL — ABNORMAL LOW (ref 8.9–10.3)
Chloride: 112 mmol/L — ABNORMAL HIGH (ref 98–111)
Creatinine, Ser: 0.78 mg/dL (ref 0.61–1.24)
GFR calc Af Amer: >60 mL/min (ref >60)
GFR calc non Af Amer: >60 mL/min (ref >60)
Glucose, Bld: 116 mg/dL — ABNORMAL HIGH (ref 70–99)
Potassium: 3.7 mmol/L (ref 3.5–5.1)
Sodium: 141 mmol/L (ref 135–145)

## 2020-01-20 LAB — CBC
HCT: 21.5 % — ABNORMAL LOW (ref 39.0–52.0)
HCT: 20.3 % — ABNORMAL LOW (ref 39.0–52.0)
Hemoglobin: 7 g/dL — ABNORMAL LOW (ref 13.0–17.0)
Hemoglobin: 6.6 g/dL — CL (ref 13.0–17.0)
MCH: 32.9 pg (ref 26.0–34.0)
MCH: 32.8 pg (ref 26.0–34.0)
MCHC: 32.6 g/dL (ref 30.0–36.0)
MCHC: 32.5 g/dL (ref 30.0–36.0)
MCV: 100.9 fL — ABNORMAL HIGH (ref 80.0–100.0)
MCV: 101 fL — ABNORMAL HIGH (ref 80.0–100.0)
Platelets: 125 10*3/uL — ABNORMAL LOW (ref 150–400)
Platelets: 121 10*3/uL — ABNORMAL LOW (ref 150–400)
RBC: 2.13 MIL/uL — ABNORMAL LOW (ref 4.22–5.81)
RBC: 2.01 MIL/uL — ABNORMAL LOW (ref 4.22–5.81)
RDW: 13.3 % (ref 11.5–15.5)
RDW: 13.2 % (ref 11.5–15.5)
WBC: 7.7 10*3/uL (ref 4.0–10.5)
WBC: 6.4 10*3/uL (ref 4.0–10.5)
nRBC: 0 % (ref 0.0–0.2)
nRBC: 0 % (ref 0.0–0.2)

## 2020-01-20 LAB — HEMOGLOBIN AND HEMATOCRIT, BLOOD
HCT: 24.3 % — ABNORMAL LOW (ref 39.0–52.0)
Hemoglobin: 8 g/dL — ABNORMAL LOW (ref 13.0–17.0)

## 2020-01-20 LAB — GLUCOSE, CAPILLARY: Glucose-Capillary: 144 mg/dL — ABNORMAL HIGH (ref 70–99)

## 2020-01-20 LAB — PREPARE RBC (CROSSMATCH)

## 2020-01-20 MED ORDER — DIPHENHYDRAMINE HCL 50 MG/ML IJ SOLN
25.0000 mg | Freq: Once | INTRAMUSCULAR | Status: AC
  Administered 2020-01-20: 25 mg via INTRAVENOUS
  Filled 2020-01-20: qty 1

## 2020-01-20 MED ORDER — SODIUM CHLORIDE 0.9% IV SOLUTION: Freq: Once | INTRAVENOUS | Status: AC

## 2020-01-20 MED ORDER — ACETAMINOPHEN 325 MG PO TABS
650.0000 mg | ORAL_TABLET | Freq: Once | ORAL | Status: AC
  Administered 2020-01-20: 650 mg via ORAL
  Filled 2020-01-20: qty 2

## 2020-01-20 MED ORDER — FUROSEMIDE 10 MG/ML IJ SOLN
20.0000 mg | Freq: Once | INTRAMUSCULAR | Status: AC
  Administered 2020-01-20: 20 mg via INTRAVENOUS
  Filled 2020-01-20: qty 2

## 2020-01-20 NOTE — Progress Notes (Signed)
PROGRESS NOTE        PATIENT DETAILS Name: Christian Vance Age: 59 y.o. Sex: male Date of Birth: 02-Mar-1961 Admit Date: 01/18/2020 Admitting Physician Mckinley Jewel, MD EYC:[id number] Everardo Beals, MD  Brief Narrative: Patient is a 59 y.o. male with past medical history of hepatitis B, DM-2-who presented with painless hematochezia and acute blood loss anemia.  Significant events: 6/11>> admit to [facility] for lower GI bleeding with acute blood loss anemia.  Significant studies:   Antimicrobial therapy: None  Microbiology data: None  Procedures : 6/12>> EGD: Erosive gastropathy-with no bleeding stigmata 6/13>> colonoscopy: Single nonbleeding colonic angiectasia, four to 6 to 7 mm polyps at rectosigmoid junction, scattered diverticulosis  Consults: GI  DVT Prophylaxis : SCD's  Subjective: Had one large bloody BM in the evening yesterday-following colonoscopy.  No bleeding overnight to this morning.  Assessment/Plan: Lower GI bleeding with acute blood loss anemia: Had one large bloody BM in the evening post colonoscopy yesterday.  Felt to have diverticular bleeding-see above for results of EGD/colonoscopy.  Hemoglobin down to 6.6-transfuse 1 unit of PRBC-if hematochezia reoccurs-we will need either a tagged RBC or a CT angiogram to localize bleeding for subsequent embolization.  Follow closely-await posttransfusion CBC.  Lightheadedness/presyncope/syncope/exertional dyspnea: All likely secondary to acute blood loss anemia-continue to follow CBC.  History of hepatitis B: Followed at the ID clinic-currently not on any treatment.  Macrocytosis: Vitamin B12/folate level within normal limits-stable for outpatient follow-up.  DM-2: CBG stable-continue SSI  Recent Labs    01/19/20 1644 01/19/20 2054 01/20/20 0744  GLUCAP 206* 119* 144*    Diet: Diet Order            Diet full liquid Room service appropriate? Yes; Fluid consistency: Thin  Diet  effective now                  Code Status: Full code  Family Communication: None at bedside  Disposition Plan: Status is: Inpatient  Remains inpatient appropriate because:Inpatient level of care appropriate due to severity of illness  Dispo: The patient is from: Home              Anticipated d/c is to: Home              Anticipated d/c date is: 2 days              Patient currently is not medically stable to d/c.  Barriers to Discharge: Ongoing GI bleeding with acute blood loss anemia-requiring PRBC transfusion today.  If bleeding reoccurs-may require further work-up with a CT angiogram and embolization.  Antimicrobial agents: Anti-infectives (From admission, onward)   None       Time spent: 25 minutes-Greater than 50% of this time was spent in counseling, explanation of diagnosis, planning of further management, and coordination of care.  MEDICATIONS: Scheduled Meds: . insulin aspart  0-15 Units Subcutaneous TID WC  . insulin aspart  0-5 Units Subcutaneous QHS  . pantoprazole  40 mg Oral Daily   Continuous Infusions: . sodium chloride 10 mL/hr at 01/20/20 0924   PRN Meds:.acetaminophen **OR** acetaminophen, HYDROmorphone (DILAUDID) injection, ondansetron **OR** ondansetron (ZOFRAN) IV   PHYSICAL EXAM: Vital signs: Vitals:   01/20/20 0800 01/20/20 1330 01/20/20 1445 01/20/20 1500  BP: 111/61 99/87 102/70 103/73  Pulse: 98 89 89 84  Resp: 15 20 20 16   Temp:  98.7 F (37.1 C) 97.8 F (36.6 C) 97.8 F (36.6 C) 98.4 F (36.9 C)  TempSrc: Oral Oral Oral Oral  SpO2: 98% 99% 99% 99%  Weight:      Height:       Filed Weights   01/18/20 1549 01/19/20 0535 01/19/20 1113  Weight: 78.6 kg 75.3 kg 75 kg   Body mass index is 26.69 kg/m.   Gen Exam:Alert awake-not in any distress HEENT:atraumatic, normocephalic Chest: B/L clear to auscultation anteriorly CVS:S1S2 regular Abdomen:soft non tender, non distended Extremities:no edema Neurology: Non  focal Skin: no rash  I have personally reviewed following labs and imaging studies  LABORATORY DATA: CBC: Recent Labs  Lab 01/18/20 1845 01/19/20 0309 01/19/20 1510 01/20/20 0525 01/20/20 1119  WBC 6.0 5.1 5.7 7.7 6.4  NEUTROABS 2.7  --   --   --   --   HGB 9.3* 9.1* 8.5* 7.0* 6.6*  HCT 28.0* 27.9* 26.8* 21.5* 20.3*  MCV 99.6 100.7* 101.9* 100.9* 101.0*  PLT 134* 150 131* 125* 121*    Basic Metabolic Panel: Recent Labs  Lab 01/18/20 1023 01/19/20 0309 01/20/20 0525  NA 140 143 141  K 4.0 3.9 3.7  CL 109 113* 112*  CO2 22 24 25   GLUCOSE 118* 103* 116*  BUN 14 10 5*  CREATININE 0.89 0.90 0.78  CALCIUM 8.9 8.2* 7.9*    GFR: Estimated Creatinine Clearance: 87.5 mL/min (by C-G formula based on SCr of 0.78 mg/dL).  Liver Function Tests: Recent Labs  Lab 01/18/20 1023 01/19/20 0309  AST 18 17  ALT 16 18  ALKPHOS 49 41  BILITOT 1.1 0.7  PROT 6.3* 5.5*  ALBUMIN 3.6 3.1*   No results for input(s): LIPASE, AMYLASE in the last 168 hours. No results for input(s): AMMONIA in the last 168 hours.  Coagulation Profile: Recent Labs  Lab 01/18/20 1522  INR 1.2    Cardiac Enzymes: No results for input(s): CKTOTAL, CKMB, CKMBINDEX, TROPONINI in the last 168 hours.  BNP (last 3 results) No results for input(s): PROBNP in the last 8760 hours.  Lipid Profile: No results for input(s): CHOL, HDL, LDLCALC, TRIG, CHOLHDL, LDLDIRECT in the last 72 hours.  Thyroid Function Tests: No results for input(s): TSH, T4TOTAL, FREET4, T3FREE, THYROIDAB in the last 72 hours.  Anemia Panel: Recent Labs    01/18/20 1410  VITAMINB12 301  FOLATE 29.6    Urine analysis:    Component Value Date/Time   COLORURINE STRAW (A) 01/18/2020 1500   APPEARANCEUR CLEAR 01/18/2020 1500   LABSPEC 1.008 01/18/2020 1500   PHURINE 5.0 01/18/2020 1500   GLUCOSEU NEGATIVE 01/18/2020 1500   GLUCOSEU >=1000 (A) 10/15/2016 1441   HGBUR NEGATIVE 01/18/2020 1500   BILIRUBINUR NEGATIVE  01/18/2020 1500   KETONESUR NEGATIVE 01/18/2020 1500   PROTEINUR NEGATIVE 01/18/2020 1500   UROBILINOGEN 1.0 10/15/2016 1441   NITRITE NEGATIVE 01/18/2020 1500   LEUKOCYTESUR NEGATIVE 01/18/2020 1500    Sepsis Labs: Lactic Acid, Venous No results found for: LATICACIDVEN  MICROBIOLOGY: Recent Results (from the past 240 hour(s))  SARS Coronavirus 2 by RT PCR (hospital order, performed in [place] hospital lab) Nasopharyngeal Nasopharyngeal Swab     Status: None   Collection Time: 01/18/20 12:40 PM   Specimen: Nasopharyngeal Swab  Result Value Ref Range Status   SARS Coronavirus 2 NEGATIVE NEGATIVE Final    Comment: (NOTE) SARS-CoV-2 target nucleic acids are NOT DETECTED.  The SARS-CoV-2 RNA is generally detectable in upper and lower respiratory specimens during the acute  phase of infection. The lowest concentration of SARS-CoV-2 viral copies this assay can detect is 250 copies / mL. A negative result does not preclude SARS-CoV-2 infection and should not be used as the sole basis for treatment or other patient management decisions.  A negative result may occur with improper specimen collection / handling, submission of specimen other than nasopharyngeal swab, presence of viral mutation(s) within the areas targeted by this assay, and inadequate number of viral copies (<250 copies / mL). A negative result must be combined with clinical observations, patient history, and epidemiological information.  Fact Sheet for Patients:   StrictlyIdeas.no  Fact Sheet for Healthcare Providers: BankingDealers.co.za  This test is not yet approved or  cleared by the [place] FDA and has been authorized for detection and/or diagnosis of SARS-CoV-2 by FDA under an Emergency Use Authorization (EUA).  This EUA will remain in effect (meaning this test can be used) for the duration of the COVID-19 declaration under Section 564(b)(1) of the Act, 21  U.S.C. section 360bbb-3(b)(1), unless the authorization is terminated or revoked sooner.  Performed at [facility], [place] [address] [place], [place] [address]     RADIOLOGY STUDIES/RESULTS: No results found.   LOS: 2 days   Oren Binet, MD  [facility]    To contact the attending provider between 7A-7P or the covering provider during after hours 7P-7A, please log into the web site [email or web address] and access using universal [place] password for that web site. If you do not have the password, please call the hospital operator.  01/20/2020, 3:21 PM

## 2020-01-20 NOTE — Progress Notes (Signed)
Patient has one unit of blood ready at the blood bank but is apprehensive having that transfused.  Patient and wife spoke about it and would like to wait before having the transfusion to see if he has any further episodes of hematochezia . Advised provider Dr. Sloan Leiter of situation and he ordered CBC Stat at this time.

## 2020-01-20 NOTE — Anesthesia Postprocedure Evaluation (Signed)
Anesthesia Post Note  Patient: Christian Vance  Procedure(s) Performed: COLONOSCOPY WITH PROPOFOL (N/A ) ESOPHAGOGASTRODUODENOSCOPY (EGD) WITH PROPOFOL (N/A ) HOT HEMOSTASIS (ARGON PLASMA COAGULATION/BICAP) (N/A ) POLYPECTOMY BIOPSY     Patient location during evaluation: PACU Anesthesia Type: MAC Level of consciousness: awake and alert Pain management: pain level controlled Vital Signs Assessment: post-procedure vital signs reviewed and stable Respiratory status: spontaneous breathing, nonlabored ventilation, respiratory function stable and patient connected to nasal cannula oxygen Cardiovascular status: stable and blood pressure returned to baseline Postop Assessment: no apparent nausea or vomiting Anesthetic complications: no   No complications documented.  Last Vitals:  Vitals:   01/19/20 1525 01/19/20 2000  BP: (!) 113/56 112/72  Pulse: 97   Resp: 19 (!) 23  Temp:  37.4 C  SpO2: 98%     Last Pain:  Vitals:   01/19/20 2000  TempSrc: Oral  PainSc:                  Luva Metzger COKER

## 2020-01-20 NOTE — Progress Notes (Signed)
Transfusion completed at 5:45 pm. Patient has had no residual effects at this time.  Patient is resting comfortably with his family at bedside. Patient denies any pain, dyspnea. Patient has not had a bowel movement today as of yet but will report to next shift to continue to monitor and notify physician if he does have any hematochezia over the night.  Patient is alert and oriented in no distress. Vitals were obtained throughout blood transfusion. H & H  Ordered.

## 2020-01-20 NOTE — Progress Notes (Signed)
    Progress Note   Subjective  Painless bloody stool about 8 or 9 pm last night. No recurrence since. He feels well resting in bed.     Objective  Vital signs in last 24 hours: Temp:  [97.7 F (36.5 C)-99.3 F (37.4 C)] 99.3 F (37.4 C) (06/13 0550) Pulse Rate:  [72-98] 98 (06/13 0800) Resp:  [13-23] 15 (06/13 0800) BP: (99-115)/(56-87) 111/61 (06/13 0800) SpO2:  [98 %-100 %] 98 % (06/13 0800) Weight:  [75 kg] 75 kg (06/12 1113) Last BM Date: 01/19/20  General: Alert, well-developed, in NAD Heart:  Regular rate and rhythm; no murmurs Chest: Clear to ascultation bilaterally Abdomen:  Soft, nontender and nondistended. Normal bowel sounds, without guarding, and without rebound.   Extremities:  Without edema. Neurologic:  Alert and  oriented x4; grossly normal neurologically. Psych:  Alert and cooperative. Normal mood and affect.  Intake/Output from previous day: 06/12 0701 - 06/13 0700 In: 900 [I.V.:900] Out: -  Intake/Output this shift: Total I/O In: 240 [P.O.:240] Out: -   Lab Results: Recent Labs    01/19/20 0309 01/19/20 1510 01/20/20 0525  WBC 5.1 5.7 7.7  HGB 9.1* 8.5* 7.0*  HCT 27.9* 26.8* 21.5*  PLT 150 131* 125*   BMET Recent Labs    01/18/20 1023 01/19/20 0309 01/20/20 0525  NA 140 143 141  K 4.0 3.9 3.7  CL 109 113* 112*  CO2 22 24 25   GLUCOSE 118* 103* 116*  BUN 14 10 5*  CREATININE 0.89 0.90 0.78  CALCIUM 8.9 8.2* 7.9*   LFT Recent Labs    01/19/20 0309  PROT 5.5*  ALBUMIN 3.1*  AST 17  ALT 18  ALKPHOS 41  BILITOT 0.7   PT/INR Recent Labs    01/18/20 1522  LABPROT 14.8  INR 1.2      Assessment & Recommendations   1. Recurrent GI bleeding, suspected diverticular source. Appears to have stopped for the past 12 hours. If recurrent bleeding proceed with stat CTA. Observe today for rebleeding. Change diet to full liquids.   2.  ABL anemia. Hgb now 7.0. Consider transfusion now or wait until below 7. Trend CBC. Defer to  primary service.     LOS: 2 days   Norberto Sorenson T. Fuller Plan MD 01/20/2020, 9:23 AM

## 2020-01-21 ENCOUNTER — Ambulatory Visit: Payer: 59 | Admitting: Family

## 2020-01-21 ENCOUNTER — Other Ambulatory Visit: Payer: Self-pay

## 2020-01-21 DIAGNOSIS — K5730 Diverticulosis of large intestine without perforation or abscess without bleeding: Secondary | ICD-10-CM

## 2020-01-21 DIAGNOSIS — D127 Benign neoplasm of rectosigmoid junction: Secondary | ICD-10-CM

## 2020-01-21 DIAGNOSIS — K5520 Angiodysplasia of colon without hemorrhage: Secondary | ICD-10-CM

## 2020-01-21 LAB — BASIC METABOLIC PANEL
Anion gap: 6 (ref 5–15)
BUN: 5 mg/dL — ABNORMAL LOW (ref 8–23)
CO2: 25 mmol/L (ref 22–32)
Calcium: 8.7 mg/dL — ABNORMAL LOW (ref 8.9–10.3)
Chloride: 112 mmol/L — ABNORMAL HIGH (ref 98–111)
Creatinine, Ser: 0.76 mg/dL (ref 0.61–1.24)
GFR calc Af Amer: >60 mL/min (ref >60)
GFR calc non Af Amer: >60 mL/min (ref >60)
Glucose, Bld: 115 mg/dL — ABNORMAL HIGH (ref 70–99)
Potassium: 3.6 mmol/L (ref 3.5–5.1)
Sodium: 143 mmol/L (ref 135–145)

## 2020-01-21 LAB — TYPE AND SCREEN
ABO/RH(D): O POS
Antibody Screen: NEGATIVE
Unit division: 0

## 2020-01-21 LAB — CBC
HCT: 24.8 % — ABNORMAL LOW (ref 39.0–52.0)
Hemoglobin: 8.3 g/dL — ABNORMAL LOW (ref 13.0–17.0)
MCH: 32.8 pg (ref 26.0–34.0)
MCHC: 33.5 g/dL (ref 30.0–36.0)
MCV: 98 fL (ref 80.0–100.0)
Platelets: 119 10*3/uL — ABNORMAL LOW (ref 150–400)
RBC: 2.53 MIL/uL — ABNORMAL LOW (ref 4.22–5.81)
RDW: 13.9 % (ref 11.5–15.5)
WBC: 4.8 10*3/uL (ref 4.0–10.5)
nRBC: 0.4 % — ABNORMAL HIGH (ref 0.0–0.2)

## 2020-01-21 LAB — GLUCOSE, CAPILLARY: Glucose-Capillary: 77 mg/dL (ref 70–99)

## 2020-01-21 LAB — BPAM RBC
Blood Product Expiration Date: 202107122359
ISSUE DATE / TIME: 202106131441
Unit Type and Rh: 5100

## 2020-01-21 MED ORDER — PANTOPRAZOLE SODIUM 40 MG PO TBEC: 40.0000 mg | DELAYED_RELEASE_TABLET | Freq: Every day | ORAL | 0 refills | Status: DC

## 2020-01-21 NOTE — Progress Notes (Signed)
Chaplain responded to spiritual consult for delivery of AD.  Upon arrival found Christian was in consult with PA so left with plan to return later in the day.  Returned in the early afternoon to determine Christian Vance had been discharged.  De Burrs Chaplain Resident

## 2020-01-21 NOTE — Discharge Summary (Addendum)
PATIENT DETAILS Name: Christian Vance Age: 59 y.o. Sex: male Date of Birth: [birth year] MRN: [id number]. Admitting Physician: Christian Jewel, MD [id number] Christian Beals, MD  Admit Date: 01/18/2020 Discharge date: 01/21/2020  Recommendations for Outpatient Follow-up:  1. Follow up with PCP in 1-2 weeks 2. Please obtain CMP/CBC in one week 3. Please follow up colon polyps and gastric biopsy results.  Admitted From:  Home  Disposition: [place]: No  Equipment/Devices: None  Discharge Condition: Stable  CODE STATUS: FULL CODE  Diet recommendation:  Diet Order            Diet - low sodium heart healthy           Diet Carb Modified           Diet full liquid Room service appropriate? Yes; Fluid consistency: Thin  Diet effective now                 Brief Narrative: Patient is a 59 y.o. male with past medical history of hepatitis B, DM-2-who presented with painless hematochezia and acute blood loss anemia.  Significant events: 6/11>> admit to [facility] for lower GI bleeding with acute blood loss anemia.  Antimicrobial therapy: None  Microbiology data: None  Procedures : 6/12>> EGD: Erosive gastropathy-with no bleeding stigmata 6/13>> colonoscopy: Single nonbleeding colonic angiectasia, four to 6 to 7 mm polyps at rectosigmoid junction, scattered diverticulosis  Consults: GI  Brief Hospital Course: Lower GI bleeding with acute blood loss anemia:  Admitted and underwent EGD/colonoscopy-see results above.  Felt to have diverticular bleeding.  Post colonoscopy-had 1 episode of large-volume hematochezia-he has not had any further lower GI bleeding for more than 1 day now.  He did require 1 unit of PRBC transfusion.  Hemoglobin stable this morning.  Evaluated by gastroenterology earlier this morning-spoke with gastroenterology PA-C-okay for discharge.  Please follow EGD/colonoscopy biopsy results.  Lightheadedness/presyncope/syncope/exertional  dyspnea: All likely secondary to acute blood loss anemia-probable orthostatic mechanism.  All resolved-patient ambulating-no major issues.  History of hepatitis B: Followed at the ID clinic-currently not on any treatment.  Macrocytosis: Vitamin B12/folate level within normal limits-stable for outpatient follow-up.  DM-2: CBG stable-managed with SSI-resume Metformin on discharge.  Erosive gastropathy: Seen on EGD-recommendations are for PPI daily for 8 weeks.   Discharge Diagnoses:  Principal Problem:   Acute GI bleeding Active Problems:   DM (diabetes mellitus), type 2 (HCC)   Hepatitis B, chronic (HCC)   Macrocytosis   AVM (arteriovenous malformation) of colon without hemorrhage   Hematochezia   Benign neoplasm of rectosigmoid junction   Diverticulosis of colon without hemorrhage   Discharge Instructions:  Activity:  As tolerated  Discharge Instructions    Diet - low sodium heart healthy   Complete by: As directed    Diet Carb Modified   Complete by: As directed    Discharge instructions   Complete by: As directed    1.)  Please ask your primary care practitioner to follow results of pathology from EGD/colonoscopy.    2.)  Please ask your primary care practitioner to repeat CBC in 2 weeks  3.)  Please seek immediate medical attention if you have recurrent hematochezia/rectal bleeding/black stools.   Follow with Primary MD  Christian Vance, Christian Halsted, MD in 1-2 weeks  Please get a complete blood count and chemistry panel checked by your Primary MD at your next visit, and again as instructed by your Primary MD.  Get Medicines reviewed and adjusted: Please  take all your medications with you for your next visit with your Primary MD  Laboratory/radiological data: Please request your Primary MD to go over all hospital tests and procedure/radiological results at the follow up, please ask your Primary MD to get all Hospital records sent to his/her office.  In some cases,  they will be blood work, cultures and biopsy results pending at the time of your discharge. Please request that your primary care M.D. follows up on these results.  Also Note the following: If you experience worsening of your admission symptoms, develop shortness of breath, life threatening emergency, suicidal or homicidal thoughts you must seek medical attention immediately by calling 911 or calling your MD immediately  if symptoms less severe.  You must read complete instructions/literature along with all the possible adverse reactions/side effects for all the Medicines you take and that have been prescribed to you. Take any new Medicines after you have completely understood and accpet all the possible adverse reactions/side effects.   Do not drive when taking Pain medications or sleeping medications (Benzodaizepines)  Do not take more than prescribed Pain, Sleep and Anxiety Medications. It is not advisable to combine anxiety,sleep and pain medications without talking with your primary care practitioner  Special Instructions: If you have smoked or chewed Tobacco  in the last 2 yrs please stop smoking, stop any regular Alcohol  and or any Recreational drug use.  Wear Seat belts while driving.  Please note: You were cared for by a hospitalist during your hospital stay. Once you are discharged, your primary care physician will handle any further medical issues. Please note that NO REFILLS for any discharge medications will be authorized once you are discharged, as it is imperative that you return to your primary care physician (or establish a relationship with a primary care physician if you do not have one) for your post hospital discharge needs so that they can reassess your need for medications and monitor your lab values.   Increase activity slowly   Complete by: As directed      Allergies as of 01/21/2020   No Known Allergies     Medication List    TAKE these medications     acetaminophen 650 MG CR tablet Commonly known as: TYLENOL Take 650 mg by mouth 5 (five) times daily as needed for pain.   cetirizine 10 MG tablet Commonly known as: ZYRTEC Take 10 mg by mouth daily.   metFORMIN 1000 MG tablet Commonly known as: GLUCOPHAGE Take 1 tablet (1,000 mg total) by mouth 2 (two) times daily with a meal.   multivitamin with minerals Tabs tablet Take 1 tablet by mouth daily. Centrum   pantoprazole 40 MG tablet Commonly known as: PROTONIX Take 1 tablet (40 mg total) by mouth daily. Start taking on: January 22, 2020       Follow-up Information    Christian Vance, Christian Halsted, MD. Schedule an appointment as soon as possible for a visit in 1 week(s).   Specialty: Internal Medicine Contact information: [place] [address] [id number] [phone]              No Known Allergies   Other Procedures/Studies: No results found.   TODAY-DAY OF DISCHARGE:  Subjective:   Christian Vance today has no headache,no chest abdominal pain,no new weakness tingling or numbness, feels much better wants to go home today.   Objective:   Blood pressure 115/89, pulse 98, temperature 97.7 F (36.5 C), temperature source Oral, resp. rate 16,  height 5\' 6"  (1.676 m), weight 77 kg, SpO2 100 %.  Intake/Output Summary (Last 24 hours) at 01/21/2020 1053 Last data filed at 01/21/2020 0900 Gross per 24 hour  Intake 2666 ml  Output 1500 ml  Net 1166 ml   Filed Weights   01/19/20 0535 01/19/20 1113 01/21/20 0313  Weight: 75.3 kg 75 kg 77 kg    Exam: Awake Alert, Oriented *3, No new F.N deficits, Normal affect [place].AT,PERRAL Supple Neck,No JVD, No cervical lymphadenopathy appriciated.  Symmetrical Chest wall movement, Good air movement bilaterally, CTAB RRR,No Gallops,Rubs or new Murmurs, No Parasternal Heave +ve B.Sounds, Abd Soft, Non tender, No organomegaly appriciated, No rebound -guarding or rigidity. No Cyanosis, Clubbing or edema, No new Rash or  bruise   PERTINENT RADIOLOGIC STUDIES: No results found.   PERTINENT LAB RESULTS: CBC: Recent Labs    01/20/20 1119 01/20/20 1119 01/20/20 2013 01/21/20 0305  WBC 6.4  --   --  4.8  HGB 6.6*   < > 8.0* 8.3*  HCT 20.3*   < > 24.3* 24.8*  PLT 121*  --   --  119*   < > = values in this interval not displayed.   CMET CMP     Component Value Date/Time   NA 143 01/21/2020 0305   K 3.6 01/21/2020 0305   CL 112 (H) 01/21/2020 0305   CO2 25 01/21/2020 0305   GLUCOSE 115 (H) 01/21/2020 0305   BUN 5 (L) 01/21/2020 0305   CREATININE 0.76 01/21/2020 0305   CALCIUM 8.7 (L) 01/21/2020 0305   PROT 5.5 (L) 01/19/2020 0309   ALBUMIN 3.1 (L) 01/19/2020 0309   AST 17 01/19/2020 0309   ALT 18 01/19/2020 0309   ALKPHOS 41 01/19/2020 0309   BILITOT 0.7 01/19/2020 0309   GFRNONAA >60 01/21/2020 0305   GFRAA >60 01/21/2020 0305    GFR Estimated Creatinine Clearance: 94.8 mL/min (by C-G formula based on SCr of 0.76 mg/dL). No results for input(s): LIPASE, AMYLASE in the last 72 hours. No results for input(s): CKTOTAL, CKMB, CKMBINDEX, TROPONINI in the last 72 hours. Invalid input(s): POCBNP No results for input(s): DDIMER in the last 72 hours. Recent Labs    01/18/20 1415  HGBA1C 6.2*   No results for input(s): CHOL, HDL, LDLCALC, TRIG, CHOLHDL, LDLDIRECT in the last 72 hours. No results for input(s): TSH, T4TOTAL, T3FREE, THYROIDAB in the last 72 hours.  Invalid input(s): FREET3 Recent Labs    01/18/20 1410  VITAMINB12 301  FOLATE 29.6   Coags: Recent Labs    01/18/20 1522  INR 1.2   Microbiology: Recent Results (from the past 240 hour(s))  SARS Coronavirus 2 by RT PCR (hospital order, performed in [facility] hospital lab) Nasopharyngeal Nasopharyngeal Swab     Status: None   Collection Time: 01/18/20 12:40 PM   Specimen: Nasopharyngeal Swab  Result Value Ref Range Status   SARS Coronavirus 2 NEGATIVE NEGATIVE Final    Comment: (NOTE) SARS-CoV-2 target nucleic  acids are NOT DETECTED.  The SARS-CoV-2 RNA is generally detectable in upper and lower respiratory specimens during the acute phase of infection. The lowest concentration of SARS-CoV-2 viral copies this assay can detect is 250 copies / mL. A negative result does not preclude SARS-CoV-2 infection and should not be used as the sole basis for treatment or other patient management decisions.  A negative result may occur with improper specimen collection / handling, submission of specimen other than nasopharyngeal swab, presence of viral mutation(s) within the areas targeted  by this assay, and inadequate number of viral copies (<250 copies / mL). A negative result must be combined with clinical observations, patient history, and epidemiological information.  Fact Sheet for Patients:   StrictlyIdeas.no  Fact Sheet for Healthcare Providers: BankingDealers.co.za  This test is not yet approved or  cleared by the [place] FDA and has been authorized for detection and/or diagnosis of SARS-CoV-2 by FDA under an Emergency Use Authorization (EUA).  This EUA will remain in effect (meaning this test can be used) for the duration of the COVID-19 declaration under Section 564(b)(1) of the Act, 21 U.S.C. section 360bbb-3(b)(1), unless the authorization is terminated or revoked sooner.  Performed at [facility], [place] [address] [place], [place] [address]     FURTHER DISCHARGE INSTRUCTIONS:  Get Medicines reviewed and adjusted: Please take all your medications with you for your next visit with your Primary MD  Laboratory/radiological data: Please request your Primary MD to go over all hospital tests and procedure/radiological results at the follow up, please ask your Primary MD to get all Hospital records sent to his/her office.  In some cases, they will be blood work, cultures and biopsy results pending at the time of your discharge.  Please request that your primary care M.D. goes through all the records of your hospital data and follows up on these results.  Also Note the following: If you experience worsening of your admission symptoms, develop shortness of breath, life threatening emergency, suicidal or homicidal thoughts you must seek medical attention immediately by calling 911 or calling your MD immediately  if symptoms less severe.  You must read complete instructions/literature along with all the possible adverse reactions/side effects for all the Medicines you take and that have been prescribed to you. Take any new Medicines after you have completely understood and accpet all the possible adverse reactions/side effects.   Do not drive when taking Pain medications or sleeping medications (Benzodaizepines)  Do not take more than prescribed Pain, Sleep and Anxiety Medications. It is not advisable to combine anxiety,sleep and pain medications without talking with your primary care practitioner  Special Instructions: If you have smoked or chewed Tobacco  in the last 2 yrs please stop smoking, stop any regular Alcohol  and or any Recreational drug use.  Wear Seat belts while driving.  Please note: You were cared for by a hospitalist during your hospital stay. Once you are discharged, your primary care physician will handle any further medical issues. Please note that NO REFILLS for any discharge medications will be authorized once you are discharged, as it is imperative that you return to your primary care physician (or establish a relationship with a primary care physician if you do not have one) for your post hospital discharge needs so that they can reassess your need for medications and monitor your lab values.  Total Time spent coordinating discharge including counseling, education and face to face time equals 35 minutes.  SignedOren Binet 01/21/2020 10:53 AM

## 2020-01-21 NOTE — Progress Notes (Addendum)
Daily Rounding Note  01/21/2020, 9:52 AM  LOS: 3 days   SUBJECTIVE:   Chief complaint: painless hematochezia, presumed diverticular bleed.    No stool or bleeding since the PM after procedures on Saturday.    OBJECTIVE:         Vital signs in last 24 hours:    Temp:  [97.7 F (36.5 C)-99.2 F (37.3 C)] 97.7 F (36.5 C) (06/14 0800) Pulse Rate:  [75-98] 98 (06/14 0800) Resp:  [12-20] 16 (06/14 0800) BP: (98-124)/(65-89) 115/89 (06/14 0800) SpO2:  [96 %-100 %] 100 % (06/14 0800) Weight:  [77 kg] 77 kg (06/14 0313) Last BM Date: 01/20/20 Filed Weights   01/19/20 0535 01/19/20 1113 01/21/20 0313  Weight: 75.3 kg 75 kg 77 kg   General: pleasant, looks well   Heart: RRR Chest: clear bil.  No SOB or cough Abdomen: soft, NT, ND.  Active BS  Extremities: no CCE Neuro/Psych:  Oriented x 3.  Alert.  Good spirits.  No tremors or weakness.    Intake/Output from previous day: 06/13 0701 - 06/14 0700 In: 2726 [P.O.:1200; I.V.:900; Blood:626] Out: 1500 [Urine:1500]  Intake/Output this shift: Total I/O In: 180 [P.O.:180] Out: -   Lab Results: Recent Labs    01/20/20 0525 01/20/20 0525 01/20/20 1119 01/20/20 2013 01/21/20 0305  WBC 7.7  --  6.4  --  4.8  HGB 7.0*   < > 6.6* 8.0* 8.3*  HCT 21.5*   < > 20.3* 24.3* 24.8*  PLT 125*  --  121*  --  119*   < > = values in this interval not displayed.   BMET Recent Labs    01/19/20 0309 01/20/20 0525 01/21/20 0305  NA 143 141 143  K 3.9 3.7 3.6  CL 113* 112* 112*  CO2 24 25 25   GLUCOSE 103* 116* 115*  BUN 10 5* 5*  CREATININE 0.90 0.78 0.76  CALCIUM 8.2* 7.9* 8.7*   LFT Recent Labs    01/18/20 1023 01/19/20 0309  PROT 6.3* 5.5*  ALBUMIN 3.6 3.1*  AST 18 17  ALT 16 18  ALKPHOS 49 41  BILITOT 1.1 0.7   PT/INR Recent Labs    01/18/20 1522  LABPROT 14.8  INR 1.2   Hepatitis Panel No results for input(s): HEPBSAG, HCVAB, HEPAIGM, HEPBIGM in the  last 72 hours.  Studies/Results: No results found.  ASSESMENT:   *    Painless hematochezia.  Presumed diverticular bleed. 01/19/2020 colonoscopy.  Single, nonbleeding AVM at cecum, obliterated with APC.  Removal of four, 6 to 7 mm, polyps at rectosigmoid.  Internal hemorrhoids.  Mild diverticulosis left and right colon. 01/19/2020 EGD.  Erosive gastropathy, nonbleeding, biopsied.  Gastric erythema, biopsied.  Normal examined duodenum to D2.  *   Blood loss anemia.  Nadir Hgb 6.6 >> 1 PRBC >> 8.3.  *    Chronic, inactive hepatitis B.  Followed by infectious disease.  Next ID office visit should take place around 07/2020.  Inhomogeneous, likely fatty, liver on ultrasound 06/2019.   PLAN   *   Protonix (or other PPI) 40 mg/day x 8 weeks.    *   Ok for discharge home.  Would recheck Hgb/hct in ~ 2 weeks, can be done at PCP office.      *   Await return of pathology on colon polyps and gastric biopsies.  Will send path results via letter to pt.  *   No need for  GI fup.  Surveillance colonoscopy will be timed according to pathology findings.  Pt and wife counseled that in event of recurrent significant hematochezia, he needs to return to a local ED.        Azucena Freed  01/21/2020, 9:52 AM Phone [phone]

## 2020-01-21 NOTE — Progress Notes (Signed)
Patient is resting at this time in no acute distress. Advised patient what his Hgb is also. I send provider a message advising what Hgb is and that the patient has not had any bowel movements through the night and does not feel as though he needs to go at this time either.

## 2020-01-21 NOTE — Progress Notes (Signed)
Patient was discharged home by MD order; discharged instructions  review and give to patient with care notes and prescriptions; IV DIC; skin intact; patient will be escorted to the car by nurse tech via wheelchair  Patient has all his belongings with him.

## 2020-01-22 ENCOUNTER — Encounter: Payer: Self-pay | Admitting: Gastroenterology

## 2020-01-22 ENCOUNTER — Ambulatory Visit (INDEPENDENT_AMBULATORY_CARE_PROVIDER_SITE_OTHER): Payer: 59 | Admitting: Internal Medicine

## 2020-01-22 ENCOUNTER — Encounter: Payer: Self-pay | Admitting: Internal Medicine

## 2020-01-22 ENCOUNTER — Telehealth: Payer: Self-pay | Admitting: *Deleted

## 2020-01-22 VITALS — BP 100/64 | HR 89 | Temp 97.0°F | Wt 170.3 lb

## 2020-01-22 DIAGNOSIS — J302 Other seasonal allergic rhinitis: Secondary | ICD-10-CM

## 2020-01-22 DIAGNOSIS — K921 Melena: Secondary | ICD-10-CM | POA: Diagnosis not present

## 2020-01-22 DIAGNOSIS — K5520 Angiodysplasia of colon without hemorrhage: Secondary | ICD-10-CM

## 2020-01-22 DIAGNOSIS — Z09 Encounter for follow-up examination after completed treatment for conditions other than malignant neoplasm: Secondary | ICD-10-CM

## 2020-01-22 DIAGNOSIS — E119 Type 2 diabetes mellitus without complications: Secondary | ICD-10-CM

## 2020-01-22 LAB — SURGICAL PATHOLOGY

## 2020-01-22 MED ORDER — METFORMIN HCL 1000 MG PO TABS: 1000.0000 mg | ORAL_TABLET | Freq: Two times a day (BID) | ORAL | 1 refills | Status: DC

## 2020-01-22 MED ORDER — CETIRIZINE HCL 10 MG PO TABS: 10.0000 mg | ORAL_TABLET | Freq: Every day | ORAL | 1 refills | Status: AC

## 2020-01-22 NOTE — Patient Instructions (Addendum)
-  Nice seeing you today!!  -Come back in 2 weeks for your blood counts.  -Schedule a 3 month follow up.

## 2020-01-22 NOTE — Telephone Encounter (Signed)
Transition Care Management Follow-up Telephone Call   Date discharged?01/21/20  How have you been since you were released from the hospital? Okay- chest and head pain from fall.   Do you understand why you were in the hospital? yes   Do you understand the discharge instructions? yes   Where were you discharged to? home   Items Reviewed:  Medications reviewed: yes, add protonix  Allergies reviewed: yes  Dietary changes reviewed: yes - soft foods  Referrals reviewed: yes   Functional Questionnaire:   Activities of Daily Living (ADLs):   He states they are independent in the following: none States they require assistance with the following: none   Any transportation issues/concerns?: no   Any patient concerns? Yes - head and chest pain   Confirmed importance and date/time of follow-up visits scheduled yes  Provider Appointment booked with Dr Jerilee Hoh 01/22/20 at 2:30pm  Confirmed with patient if condition begins to worsen call PCP or go to the ER.  Patient was given the office number and encouraged to call back with question or concerns.  : yes

## 2020-01-22 NOTE — Progress Notes (Signed)
Established Patient Office Visit     This visit occurred during the SARS-CoV-2 public health emergency.  Safety protocols were in place, including screening questions prior to the visit, additional usage of staff PPE, and extensive cleaning of exam room while observing appropriate contact time as indicated for disinfecting solutions.    CC/Reason for Visit: Hospital follow-up  HPI: Christian Vance is a 59 y.o. male who is coming in today for the above mentioned reasons.  He was hospitalized from 01/18/2020-01/21/2020 due to hematochezia.  He did require 1 unit of PRBCs due to hemoglobin of 6.6.  During that hospitalization he underwent an EGD that showed erosive gastropathy without bleeding stigmata, he also had a colonoscopy that showed a single nonbleeding colonic angiectasia and four polyps and diverticulosis.  Pathology is pending at this time.  He did have symptoms of shortness of breath, palpitations, syncope and lightheadedness/dizziness that were all attributed to his acute blood loss anemia and likely orthostatic mechanism.  Most of the symptoms have resolved at this time, he still has some occasional lightheadedness.  He has had no further bowel movement since his discharge yesterday.  He has not yet started the Protonix that was prescribed.   Past Medical/Surgical History: Past Medical History:  Diagnosis Date  . Allergy   . Diabetes mellitus   . Hepatitis B infection without delta agent without hepatic coma    chronic, inactive.  followed by [facility] ID service.      Past Surgical History:  Procedure Laterality Date  . BIOPSY  01/19/2020   Procedure: BIOPSY;  Surgeon: Ladene Artist, MD;  Location: [place];  Service: Endoscopy;;  . COLONOSCOPY WITH PROPOFOL N/A 01/19/2020   Procedure: COLONOSCOPY WITH PROPOFOL;  Surgeon: Ladene Artist, MD;  Location: [facility] ENDOSCOPY;  Service: Endoscopy;  Laterality: N/A;  . ESOPHAGOGASTRODUODENOSCOPY (EGD) WITH PROPOFOL N/A 01/19/2020    Procedure: ESOPHAGOGASTRODUODENOSCOPY (EGD) WITH PROPOFOL;  Surgeon: Ladene Artist, MD;  Location: [facility] ENDOSCOPY;  Service: Endoscopy;  Laterality: N/A;  . HOT HEMOSTASIS N/A 01/19/2020   Procedure: HOT HEMOSTASIS (ARGON PLASMA COAGULATION/BICAP);  Surgeon: Ladene Artist, MD;  Location: [facility] ENDOSCOPY;  Service: Endoscopy;  Laterality: N/A;  . POLYPECTOMY  01/19/2020   Procedure: POLYPECTOMY;  Surgeon: Ladene Artist, MD;  Location: [facility] ENDOSCOPY;  Service: Endoscopy;;    Social History:  reports that he has never smoked. He has never used smokeless tobacco. He reports that he does not drink alcohol and does not use drugs.  Allergies: No Known Allergies  Family History:  Family History  Problem Relation Age of Onset  . Colon cancer Neg Hx      Current Outpatient Medications:  .  acetaminophen (TYLENOL) 650 MG CR tablet, Take 650 mg by mouth 5 (five) times daily as needed for pain., Disp: , Rfl:  .  cetirizine (ZYRTEC) 10 MG tablet, Take 1 tablet (10 mg total) by mouth daily., Disp: 90 tablet, Rfl: 1 .  metFORMIN (GLUCOPHAGE) 1000 MG tablet, Take 1 tablet (1,000 mg total) by mouth 2 (two) times daily with a meal., Disp: 180 tablet, Rfl: 1 .  Multiple Vitamin (MULTIVITAMIN WITH MINERALS) TABS tablet, Take 1 tablet by mouth daily. Centrum, Disp: , Rfl:  .  pantoprazole (PROTONIX) 40 MG tablet, Take 1 tablet (40 mg total) by mouth daily., Disp: 60 tablet, Rfl: 0  Review of Systems:  Constitutional: Denies fever, chills, diaphoresis, appetite change and fatigue.  HEENT: Denies photophobia, eye pain, redness, hearing loss, ear pain,  congestion, sore throat, rhinorrhea, sneezing, mouth sores, trouble swallowing, neck pain, neck stiffness and tinnitus.   Respiratory: Denies SOB, DOE, cough, chest tightness,  and wheezing.   Cardiovascular: Denies chest pain, palpitations and leg swelling.  Gastrointestinal: Denies nausea, vomiting, abdominal pain, diarrhea, constipation, blood in stool and  abdominal distention.  Genitourinary: Denies dysuria, urgency, frequency, hematuria, flank pain and difficulty urinating.  Endocrine: Denies: hot or cold intolerance, sweats, changes in hair or nails, polyuria, polydipsia. Musculoskeletal: Denies myalgias, back pain, joint swelling, arthralgias and gait problem.  Skin: Denies pallor, rash and wound.  Neurological: Denies dizziness, seizures, syncope, weakness, light-headedness, numbness and headaches.  Hematological: Denies adenopathy. Easy bruising, personal or family bleeding history  Psychiatric/Behavioral: Denies suicidal ideation, mood changes, confusion, nervousness, sleep disturbance and agitation    Physical Exam: Vitals:   01/22/20 1436  BP: 100/64  Pulse: 89  Temp: (!) 97 F (36.1 C)  TempSrc: Temporal  SpO2: 99%  Weight: 170 lb 4.8 oz (77.2 kg)    Body mass index is 27.49 kg/m.   Constitutional: NAD, calm, comfortable Eyes: PERRL, lids and conjunctivae normal ENMT: Mucous membranes are moist. Respiratory: clear to auscultation bilaterally, no wheezing, no crackles. Normal respiratory effort. No accessory muscle use.  Cardiovascular: Regular rate and rhythm, no murmurs / rubs / gallops. No extremity edema. Abdomen: no tenderness, no masses palpated. No hepatosplenomegaly. Bowel sounds positive.  Neurologic: Grossly intact and nonfocal Psychiatric: Normal judgment and insight. Alert and oriented x 3. Normal mood.    Impression and Plan:  Hospital discharge follow-up Hematochezia AVM (arteriovenous malformation) of colon without hemorrhage -Hospital notes have been reviewed in detail. -He will return in 2 weeks for repeat CBC. -Advised to start taking Protonix. -He will also schedule follow-up with GI.   Type 2 diabetes mellitus without complication, without long-term current use of insulin (HCC)  -Last A1c was 6.2 in June. -Requesting Metformin refills today.  Seasonal allergies  - Plan: cetirizine  (ZYRTEC) 10 MG tablet    Patient Instructions  -Nice seeing you today!!  -Come back in 2 weeks for your blood counts.  -Schedule a 3 month follow up.     Lelon Frohlich, MD [facility] at [facility]

## 2020-01-24 LAB — GLUCOSE, CAPILLARY
Glucose-Capillary: 172 mg/dL — ABNORMAL HIGH (ref 70–99)
Glucose-Capillary: 149 mg/dL — ABNORMAL HIGH (ref 70–99)
Glucose-Capillary: 130 mg/dL — ABNORMAL HIGH (ref 70–99)

## 2020-02-05 ENCOUNTER — Other Ambulatory Visit (INDEPENDENT_AMBULATORY_CARE_PROVIDER_SITE_OTHER): Payer: 59

## 2020-02-05 ENCOUNTER — Other Ambulatory Visit: Payer: Self-pay

## 2020-02-05 DIAGNOSIS — K921 Melena: Secondary | ICD-10-CM | POA: Diagnosis not present

## 2020-02-05 LAB — CBC WITH DIFFERENTIAL/PLATELET
Basophils Absolute: 0 10*3/uL (ref 0.0–0.1)
Basophils Relative: 0.2 % (ref 0.0–3.0)
Eosinophils Absolute: 0 10*3/uL (ref 0.0–0.7)
Eosinophils Relative: 1.3 % (ref 0.0–5.0)
HCT: 31 % — ABNORMAL LOW (ref 39.0–52.0)
Hemoglobin: 10 g/dL — ABNORMAL LOW (ref 13.0–17.0)
Lymphocytes Relative: 36.7 % (ref 12.0–46.0)
Lymphs Abs: 1.2 10*3/uL (ref 0.7–4.0)
MCHC: 32.4 g/dL (ref 30.0–36.0)
MCV: 98.1 fl (ref 78.0–100.0)
Monocytes Absolute: 0.3 10*3/uL (ref 0.1–1.0)
Monocytes Relative: 8.1 % (ref 3.0–12.0)
Neutro Abs: 1.8 10*3/uL (ref 1.4–7.7)
Neutrophils Relative %: 53.7 % (ref 43.0–77.0)
Platelets: 257 10*3/uL (ref 150.0–400.0)
RBC: 3.16 Mil/uL — ABNORMAL LOW (ref 4.22–5.81)
RDW: 14.6 % (ref 11.5–15.5)
WBC: 3.4 10*3/uL — ABNORMAL LOW (ref 4.0–10.5)

## 2020-05-07 ENCOUNTER — Other Ambulatory Visit: Payer: Self-pay

## 2020-05-07 ENCOUNTER — Encounter: Payer: Self-pay | Admitting: Internal Medicine

## 2020-05-07 ENCOUNTER — Ambulatory Visit (INDEPENDENT_AMBULATORY_CARE_PROVIDER_SITE_OTHER): Payer: 59

## 2020-05-07 ENCOUNTER — Ambulatory Visit (INDEPENDENT_AMBULATORY_CARE_PROVIDER_SITE_OTHER): Payer: 59 | Admitting: Internal Medicine

## 2020-05-07 VITALS — BP 120/70 | HR 80 | Temp 98.3°F | Wt 169.2 lb

## 2020-05-07 DIAGNOSIS — G8929 Other chronic pain: Secondary | ICD-10-CM

## 2020-05-07 DIAGNOSIS — M25512 Pain in left shoulder: Secondary | ICD-10-CM | POA: Diagnosis not present

## 2020-05-07 DIAGNOSIS — Z23 Encounter for immunization: Secondary | ICD-10-CM

## 2020-05-07 DIAGNOSIS — K921 Melena: Secondary | ICD-10-CM

## 2020-05-07 DIAGNOSIS — E119 Type 2 diabetes mellitus without complications: Secondary | ICD-10-CM

## 2020-05-07 LAB — POCT GLYCOSYLATED HEMOGLOBIN (HGB A1C): Hemoglobin A1C: 6 % — AB (ref 4.0–5.6)

## 2020-05-07 NOTE — Patient Instructions (Signed)
-  Nice seeing you today!!  -Lab work today; will notify you once results are available.  -Flu vaccine today.  -Will notify you with results of shoulder xray.

## 2020-05-07 NOTE — Addendum Note (Signed)
Addended by: Westley Hummer B on: 05/07/2020 04:30 PM   Modules accepted: Orders

## 2020-05-07 NOTE — Progress Notes (Signed)
Established Patient Office Visit     This visit occurred during the SARS-CoV-2 public health emergency.  Safety protocols were in place, including screening questions prior to the visit, additional usage of staff PPE, and extensive cleaning of exam room while observing appropriate contact time as indicated for disinfecting solutions.    CC/Reason for Visit: Follow-up diabetes, discuss some other concerns  HPI: Christian Vance is a 59 y.o. male who is coming in today for the above mentioned reasons. Past Medical History is significant for: Well controlled type 2 diabetes and chronic hepatitis B infection.  He has been followed by liver specialist, not currently on treatment.  He is here mainly to follow-up on diabetes.  He was hospitalized in June with acute GI bleed presumed due to AVMs and would like to have his CBC rechecked today.  He has also been having left shoulder pain that he describes as chronic, over the anterior shoulder, he is unable to abduct his shoulder beyond 110 degrees, putting on a seatbelt in the car really bothers him as well.   Past Medical/Surgical History: Past Medical History:  Diagnosis Date  . Allergy   . Diabetes mellitus   . Hepatitis B infection without delta agent without hepatic coma    chronic, inactive.  followed by [facility] ID service.      Past Surgical History:  Procedure Laterality Date  . BIOPSY  01/19/2020   Procedure: BIOPSY;  Surgeon: Ladene Artist, MD;  Location: [place];  Service: Endoscopy;;  . COLONOSCOPY WITH PROPOFOL N/A 01/19/2020   Procedure: COLONOSCOPY WITH PROPOFOL;  Surgeon: Ladene Artist, MD;  Location: [facility] ENDOSCOPY;  Service: Endoscopy;  Laterality: N/A;  . ESOPHAGOGASTRODUODENOSCOPY (EGD) WITH PROPOFOL N/A 01/19/2020   Procedure: ESOPHAGOGASTRODUODENOSCOPY (EGD) WITH PROPOFOL;  Surgeon: Ladene Artist, MD;  Location: [facility] ENDOSCOPY;  Service: Endoscopy;  Laterality: N/A;  . HOT HEMOSTASIS N/A 01/19/2020   Procedure: HOT  HEMOSTASIS (ARGON PLASMA COAGULATION/BICAP);  Surgeon: Ladene Artist, MD;  Location: [facility] ENDOSCOPY;  Service: Endoscopy;  Laterality: N/A;  . POLYPECTOMY  01/19/2020   Procedure: POLYPECTOMY;  Surgeon: Ladene Artist, MD;  Location: [facility] ENDOSCOPY;  Service: Endoscopy;;    Social History:  reports that he has never smoked. He has never used smokeless tobacco. He reports that he does not drink alcohol and does not use drugs.  Allergies: No Known Allergies  Family History:  Family History  Problem Relation Age of Onset  . Colon cancer Neg Hx      Current Outpatient Medications:  .  acetaminophen (TYLENOL) 650 MG CR tablet, Take 650 mg by mouth 5 (five) times daily as needed for pain., Disp: , Rfl:  .  cetirizine (ZYRTEC) 10 MG tablet, Take 1 tablet (10 mg total) by mouth daily., Disp: 90 tablet, Rfl: 1 .  metFORMIN (GLUCOPHAGE) 1000 MG tablet, Take 1 tablet (1,000 mg total) by mouth 2 (two) times daily with a meal., Disp: 180 tablet, Rfl: 1 .  Multiple Vitamin (MULTIVITAMIN WITH MINERALS) TABS tablet, Take 1 tablet by mouth daily. Centrum, Disp: , Rfl:  .  pantoprazole (PROTONIX) 40 MG tablet, Take 1 tablet (40 mg total) by mouth daily., Disp: 60 tablet, Rfl: 0  Review of Systems:  Constitutional: Denies fever, chills, diaphoresis, appetite change and fatigue.  HEENT: Denies photophobia, eye pain, redness, hearing loss, ear pain, congestion, sore throat, rhinorrhea, sneezing, mouth sores, trouble swallowing, neck pain, neck stiffness and tinnitus.   Respiratory: Denies SOB, DOE, cough, chest  tightness,  and wheezing.   Cardiovascular: Denies chest pain, palpitations and leg swelling.  Gastrointestinal: Denies nausea, vomiting, abdominal pain, diarrhea, constipation, blood in stool and abdominal distention.  Genitourinary: Denies dysuria, urgency, frequency, hematuria, flank pain and difficulty urinating.  Endocrine: Denies: hot or cold intolerance, sweats, changes in hair or nails,  polyuria, polydipsia. Musculoskeletal: Denies myalgias, back pain, joint swelling, arthralgias and gait problem.  Skin: Denies pallor, rash and wound.  Neurological: Denies dizziness, seizures, syncope, weakness, light-headedness, numbness and headaches.  Hematological: Denies adenopathy. Easy bruising, personal or family bleeding history  Psychiatric/Behavioral: Denies suicidal ideation, mood changes, confusion, nervousness, sleep disturbance and agitation    Physical Exam: Vitals:   05/07/20 1431  BP: 120/70  Pulse: 80  Temp: 98.3 F (36.8 C)  TempSrc: Oral  SpO2: 99%  Weight: 169 lb 3.2 oz (76.7 kg)    Body mass index is 27.31 kg/m.   Constitutional: NAD, calm, comfortable Eyes: PERRL, lids and conjunctivae normal ENMT: Mucous membranes are moist.  Respiratory: clear to auscultation bilaterally, no wheezing, no crackles. Normal respiratory effort. No accessory muscle use.  Cardiovascular: Regular rate and rhythm, no murmurs / rubs / gallops. No extremity edema. .  Neurologic: Grossly intact and nonfocal Psychiatric: Normal judgment and insight. Alert and oriented x 3. Normal mood.    Impression and Plan:  Type 2 diabetes mellitus without complication, without long-term current use of insulin ([place]) -Well-controlled with an A1c of 6.0 today.  H/o Hematochezia  - Plan: CBC with Differential/Platelet  Chronic left shoulder pain  - Plan: DG Shoulder Left -Sounds a lot like adhesive capsulitis/frozen shoulder. -Can consider referral to Ortho, MRI pending x-ray results.  Need for influenza vaccination -Flu vaccine administered today.   Patient Instructions  -Nice seeing you today!!  -Lab work today; will notify you once results are available.  -Flu vaccine today.  -Will notify you with results of shoulder xray.     Lelon Frohlich, MD [facility] at [facility]

## 2020-05-08 LAB — CBC WITH DIFFERENTIAL/PLATELET
Absolute Monocytes: 320 cells/uL (ref 200–950)
Basophils Absolute: 20 cells/uL (ref 0–200)
Basophils Relative: 0.5 %
Eosinophils Absolute: 60 cells/uL (ref 15–500)
Eosinophils Relative: 1.5 %
HCT: 46.4 % (ref 38.5–50.0)
Hemoglobin: 14.6 g/dL (ref 13.2–17.1)
Lymphs Abs: 1768 cells/uL (ref 850–3900)
MCH: 30.5 pg (ref 27.0–33.0)
MCHC: 31.5 g/dL — ABNORMAL LOW (ref 32.0–36.0)
MCV: 96.9 fL (ref 80.0–100.0)
MPV: 12.1 fL (ref 7.5–12.5)
Monocytes Relative: 8 %
Neutro Abs: 1832 cells/uL (ref 1500–7800)
Neutrophils Relative %: 45.8 %
Platelets: 166 10*3/uL (ref 140–400)
RBC: 4.79 10*6/uL (ref 4.20–5.80)
RDW: 15.8 % — ABNORMAL HIGH (ref 11.0–15.0)
Total Lymphocyte: 44.2 %
WBC: 4 10*3/uL (ref 3.8–10.8)

## 2020-07-07 ENCOUNTER — Encounter: Payer: Self-pay | Admitting: Family Medicine

## 2020-07-07 ENCOUNTER — Telehealth (INDEPENDENT_AMBULATORY_CARE_PROVIDER_SITE_OTHER): Payer: 59 | Admitting: Family Medicine

## 2020-07-07 VITALS — Wt 169.0 lb

## 2020-07-07 DIAGNOSIS — R42 Dizziness and giddiness: Secondary | ICD-10-CM

## 2020-07-07 DIAGNOSIS — E119 Type 2 diabetes mellitus without complications: Secondary | ICD-10-CM | POA: Diagnosis not present

## 2020-07-07 DIAGNOSIS — S46811A Strain of other muscles, fascia and tendons at shoulder and upper arm level, right arm, initial encounter: Secondary | ICD-10-CM

## 2020-07-07 NOTE — Progress Notes (Signed)
Virtual Visit via Video Note  I connected with Christian Vance on 07/07/20 at  3:30 PM EST by a video enabled telemedicine application 2/2 ALPFX-90 pandemic and verified that I am speaking with the correct person using two identifiers.  Location patient: home Location provider:work or home office Persons participating in the virtual visit: patient, provider  I discussed the limitations of evaluation and management by telemedicine and the availability of in person appointments. The patient expressed understanding and agreed to proceed.   HPI: Pt is a 59 year old male past medical history significant for diabetes, seasonal allergies, history of hep B followed by Dr. Jerilee Hoh who was seen for acute concern.  Patient with HA in R occipital area and dizziness x 3 days. Pt stopped taking Metformin x 3 days as he was feeling dizzy.  Not drinking much water. Pt not checking fsbs. Not drinking any juice or soda. Last hgb A1C was 6/5 in Sept 2021.  Notes pain in R side of head/neck.  Using Icy hot spray.  Sleeping on 2 older pillows with enough support per patient.  Patient denies any heavy lifting, pushing, pulling.   ROS: See pertinent positives and negatives per HPI.  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  followed by [facility] ID service.      Past Surgical History:  Procedure Laterality Date   BIOPSY  01/19/2020   Procedure: BIOPSY;  Surgeon: Ladene Artist, MD;  Location: [place];  Service: Endoscopy;;   COLONOSCOPY WITH PROPOFOL N/A 01/19/2020   Procedure: COLONOSCOPY WITH PROPOFOL;  Surgeon: Ladene Artist, MD;  Location: [facility] ENDOSCOPY;  Service: Endoscopy;  Laterality: N/A;   ESOPHAGOGASTRODUODENOSCOPY (EGD) WITH PROPOFOL N/A 01/19/2020   Procedure: ESOPHAGOGASTRODUODENOSCOPY (EGD) WITH PROPOFOL;  Surgeon: Ladene Artist, MD;  Location: [facility] ENDOSCOPY;  Service:  Endoscopy;  Laterality: N/A;   HOT HEMOSTASIS N/A 01/19/2020   Procedure: HOT HEMOSTASIS (ARGON PLASMA COAGULATION/BICAP);  Surgeon: Ladene Artist, MD;  Location: [facility] ENDOSCOPY;  Service: Endoscopy;  Laterality: N/A;   POLYPECTOMY  01/19/2020   Procedure: POLYPECTOMY;  Surgeon: Ladene Artist, MD;  Location: [facility] ENDOSCOPY;  Service: Endoscopy;;    Family History  Problem Relation Age of Onset   Colon cancer Neg Hx      Current Outpatient Medications:    acetaminophen (TYLENOL) 650 MG CR tablet, Take 650 mg by mouth 5 (five) times daily as needed for pain., Disp: , Rfl:    cetirizine (ZYRTEC) 10 MG tablet, Take 1 tablet (10 mg total) by mouth daily., Disp: 90 tablet, Rfl: 1   metFORMIN (GLUCOPHAGE) 1000 MG tablet, Take 1 tablet (1,000 mg total) by mouth 2 (two) times daily with a meal. (Patient not taking: Reported on 07/07/2020), Disp: 180 tablet, Rfl: 1   Multiple Vitamin (MULTIVITAMIN WITH MINERALS) TABS tablet, Take 1 tablet by mouth daily. Centrum, Disp: , Rfl:    pantoprazole (PROTONIX) 40 MG tablet, Take 1 tablet (40 mg total) by mouth daily., Disp: 60 tablet, Rfl: 0  EXAM:  VITALS per patient if applicable: RR between 24-09 bpm  GENERAL: alert, oriented, appears well and in no acute distress  HEENT: atraumatic, conjunctiva clear, no obvious abnormalities on inspection of external nose and ears  NECK: normal movements of the head and neck  LUNGS: on inspection no signs of respiratory distress, breathing rate appears normal, no obvious gross SOB, gasping or  wheezing  CV: no obvious cyanosis  MS: moves all visible extremities without noticeable abnormality  PSYCH/NEURO: pleasant and cooperative, no obvious depression or anxiety, speech and thought processing grossly intact  ASSESSMENT AND PLAN:  Discussed the following assessment and plan:  Dizziness -Discussed possible causes including dehydration, hypo or hyperglycemia -Supportive care encouraged such as  increasing p.o. intake of water -Patient encouraged to eat regular meals -Encouraged to restart Metformin  Type 2 diabetes mellitus without complication, without long-term current use of insulin (HCC) -Hemoglobin A1c 6.0% 05/07/2020 -pt encouraged to restart Metformin -Continue lifestyle modifications -Discussed follow-up in clinic with PCP for Rx for glucometer  Strain of right trapezius muscle, initial encounter -Discussed supportive care -Patient encouraged to try gentle stretching, heat, massage, NSAIDs as needed -We will have patient follow-up next week for continued or worsening symptoms    I discussed the assessment and treatment plan with the patient. The patient was provided an opportunity to ask questions and all were answered. The patient agreed with the plan and demonstrated an understanding of the instructions.   The patient was advised to call back or seek an in-person evaluation if the symptoms worsen or if the condition fails to improve as anticipated.  I provided 18 minutes of non-face-to-face time during this encounter.   Billie Ruddy, MD

## 2020-07-07 NOTE — Patient Instructions (Signed)
You can use heat on your neck, the icy hot, and tylenol to see if you notice an improvement in your symptoms.    You should also do the gentle stretching exercises.  You should try to drink at least 4-5 bottles of water per day.  You should restart your Metformin.  Follow up next week for continued or worsened symptoms.

## 2020-08-14 ENCOUNTER — Other Ambulatory Visit: Payer: Self-pay

## 2020-08-15 ENCOUNTER — Ambulatory Visit: Payer: 59 | Admitting: Internal Medicine

## 2020-08-25 ENCOUNTER — Other Ambulatory Visit: Payer: Self-pay | Admitting: Internal Medicine

## 2020-08-25 DIAGNOSIS — E119 Type 2 diabetes mellitus without complications: Secondary | ICD-10-CM

## 2021-02-13 ENCOUNTER — Telehealth: Payer: Self-pay | Admitting: Internal Medicine

## 2021-02-13 DIAGNOSIS — E119 Type 2 diabetes mellitus without complications: Secondary | ICD-10-CM

## 2021-02-13 MED ORDER — METFORMIN HCL 1000 MG PO TABS: ORAL_TABLET | ORAL | 0 refills | Status: DC

## 2021-02-13 NOTE — Telephone Encounter (Signed)
Refill sent.

## 2021-02-13 NOTE — Telephone Encounter (Signed)
Pt call and stated he need a refill on metFORMIN (GLUCOPHAGE) 1000 MG tablet  and want it sent to [facility] at [place].

## 2021-02-16 IMAGING — US US ABDOMEN LIMITED
1 series · 14 of 25 positions shown · non-contrast
Comparison: November 29, 2016

CLINICAL DATA: Hepatitis-B

EXAM:
ULTRASOUND ABDOMEN LIMITED RIGHT UPPER QUADRANT

[Series 1: us abdomen limited · 0.19mm/px · 14 of 52 slices shown]
[im 1/52]
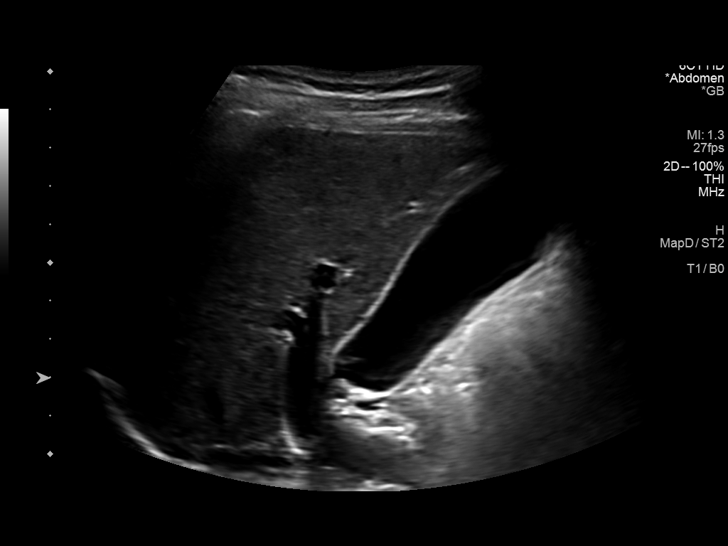
[im 5/52]
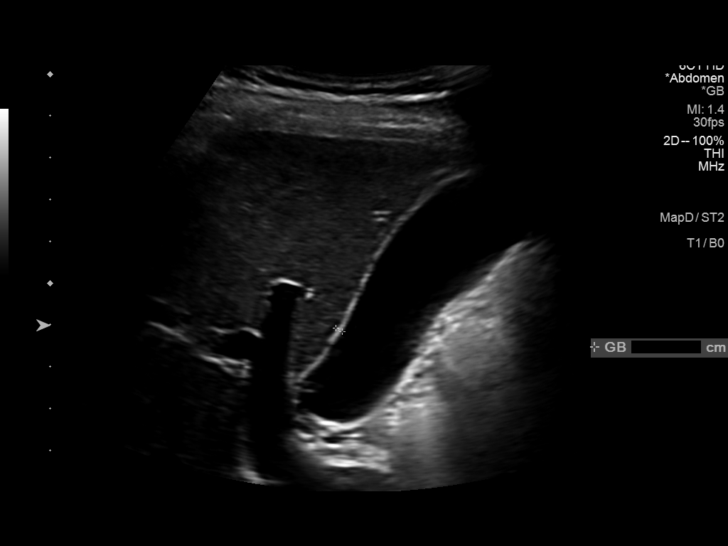
[im 9/52]
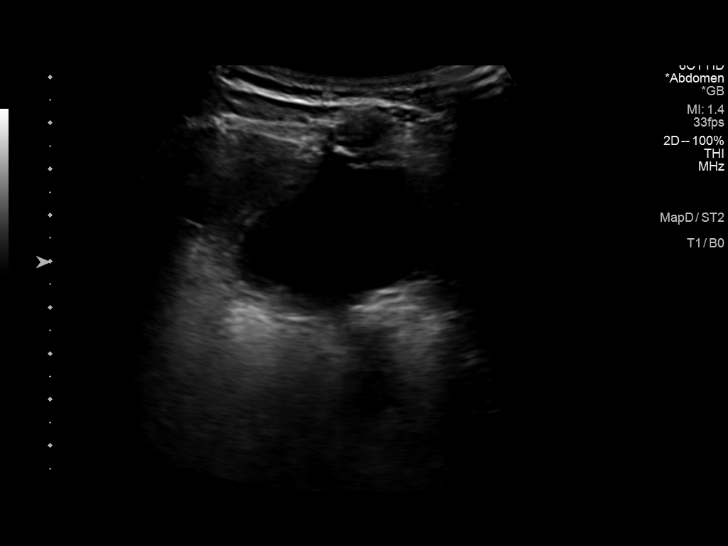
[im 13/52]
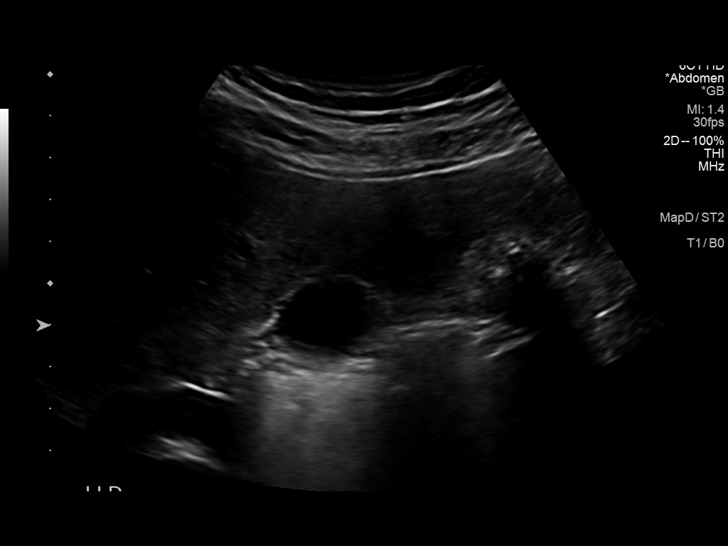
[im 18/52]
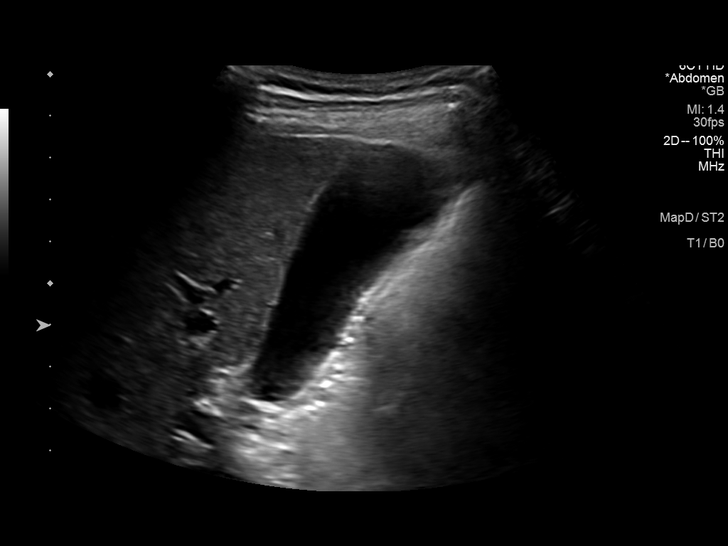
[im 20/52]
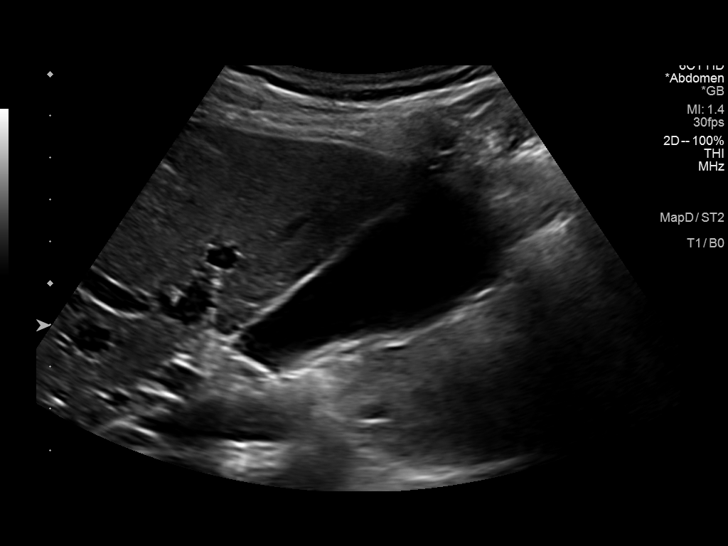
[im 24/52]
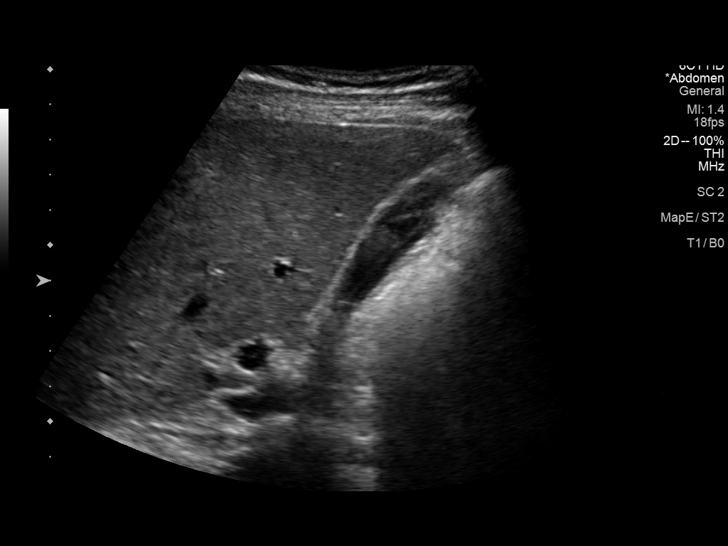
[im 28/52]
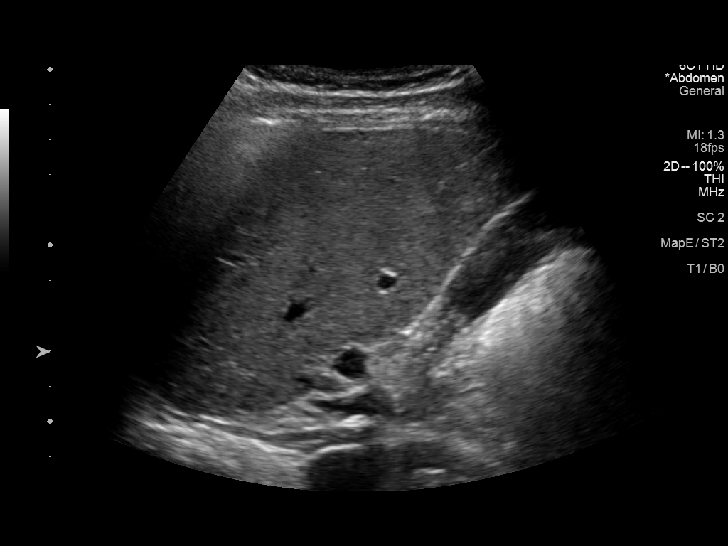
[im 32/52]
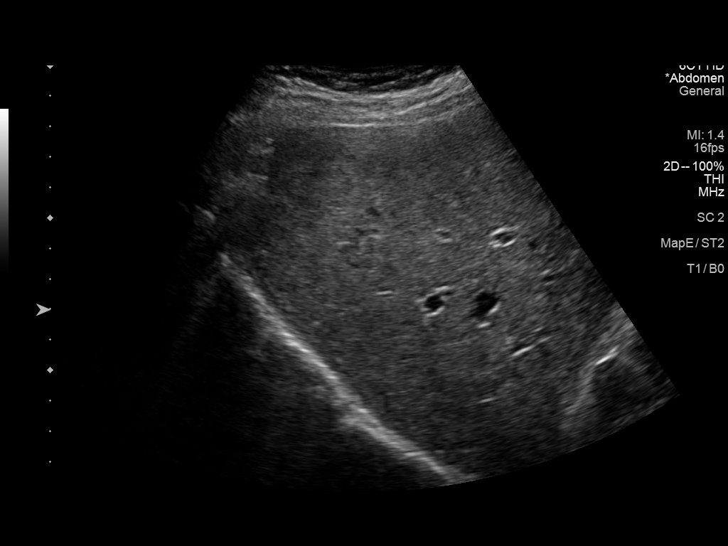
[im 35/52]
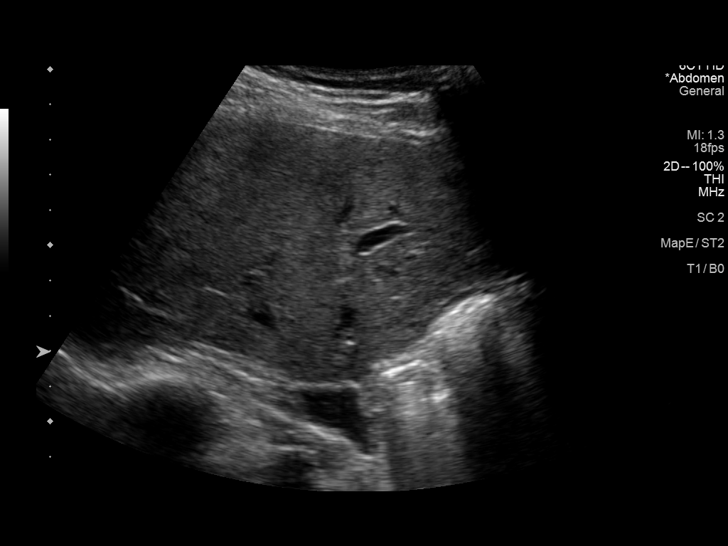
[im 39/52]
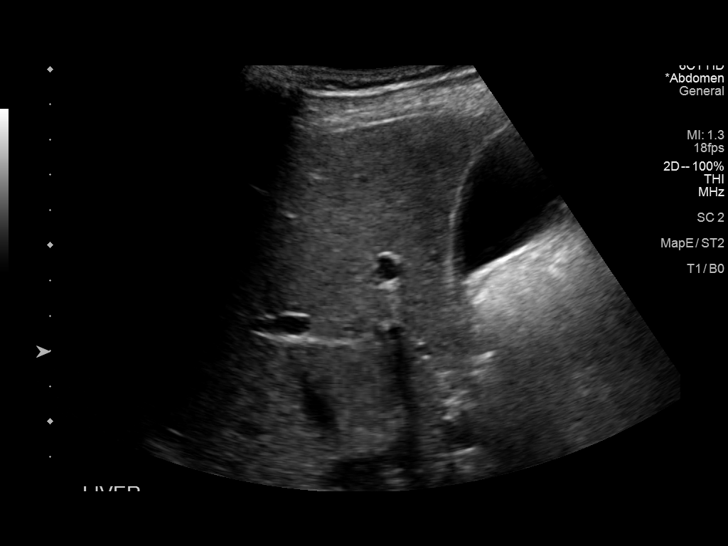
[im 43/52]
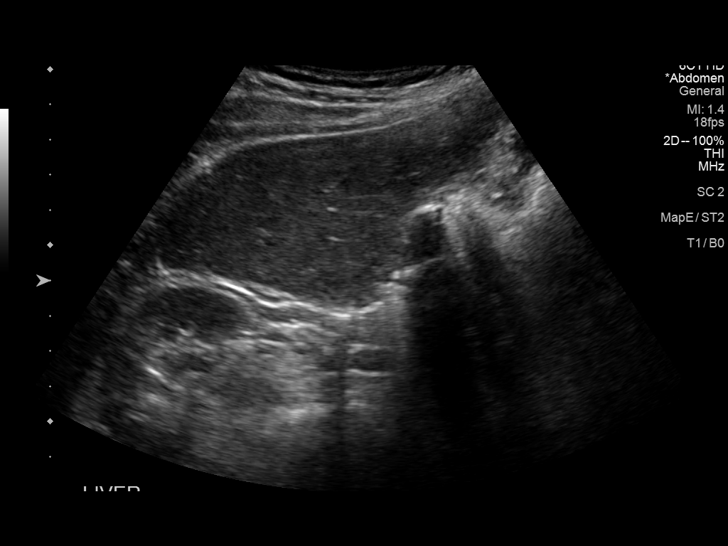
[im 47/52]
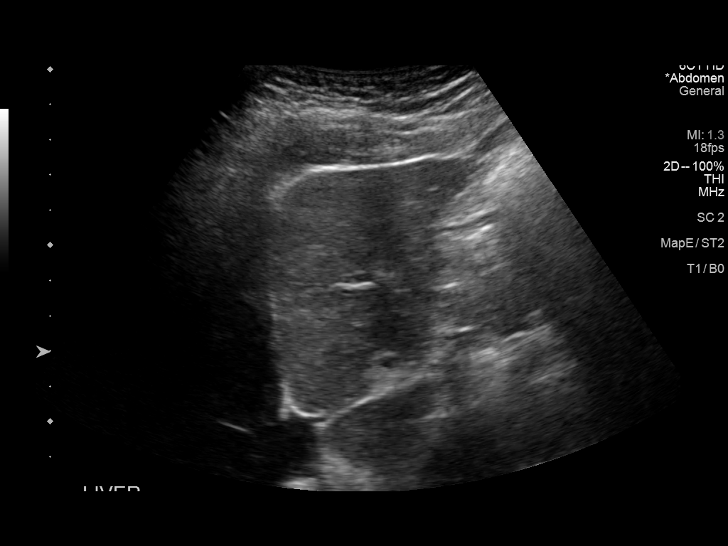
[im 52/52]
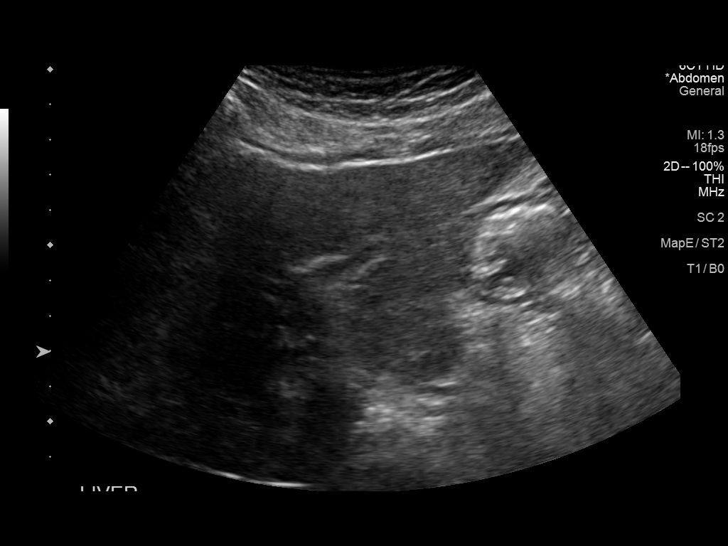

[14 of 25 positions shown; findings below may reference images not displayed]

FINDINGS: Gallbladder:

No gallstones or wall thickening visualized. There is no
pericholecystic fluid. No sonographic Murphy sign noted by
sonographer.

Common bile duct:

Diameter: 3 mm. No intrahepatic or extrahepatic biliary duct
dilatation.

Liver:

No focal lesion identified. Liver echogenicity is mildly
inhomogeneous. Portal vein is patent on color Doppler imaging with
normal direction of blood flow towards the liver.

Other: None.
IMPRESSION: Mildly inhomogeneous liver echotexture, a finding likely due to
fatty infiltration. No focal liver lesions are demonstrable. Study
otherwise unremarkable.

## 2021-02-25 ENCOUNTER — Other Ambulatory Visit: Payer: Self-pay

## 2021-02-25 ENCOUNTER — Encounter: Payer: Self-pay | Admitting: Internal Medicine

## 2021-02-25 ENCOUNTER — Ambulatory Visit: Payer: 59 | Admitting: Internal Medicine

## 2021-02-25 VITALS — BP 118/78 | HR 71 | Temp 98.0°F | Ht 71.0 in | Wt 174.4 lb

## 2021-02-25 DIAGNOSIS — N529 Male erectile dysfunction, unspecified: Secondary | ICD-10-CM | POA: Diagnosis not present

## 2021-02-25 DIAGNOSIS — R42 Dizziness and giddiness: Secondary | ICD-10-CM | POA: Diagnosis not present

## 2021-02-25 DIAGNOSIS — E119 Type 2 diabetes mellitus without complications: Secondary | ICD-10-CM

## 2021-02-25 LAB — COMPREHENSIVE METABOLIC PANEL
ALT: 17 U/L (ref 0–53)
AST: 16 U/L (ref 0–37)
Albumin: 4.4 g/dL (ref 3.5–5.2)
Alkaline Phosphatase: 87 U/L (ref 39–117)
BUN: 15 mg/dL (ref 6–23)
CO2: 29 mEq/L (ref 19–32)
Calcium: 9.4 mg/dL (ref 8.4–10.5)
Chloride: 104 mEq/L (ref 96–112)
Creatinine, Ser: 0.81 mg/dL (ref 0.40–1.50)
GFR: 94.71 mL/min (ref >60.00)
Glucose, Bld: 142 mg/dL — ABNORMAL HIGH (ref 70–99)
Potassium: 4.2 mEq/L (ref 3.5–5.1)
Sodium: 139 mEq/L (ref 135–145)
Total Bilirubin: 0.5 mg/dL (ref 0.2–1.2)
Total Protein: 7.3 g/dL (ref 6.0–8.3)

## 2021-02-25 LAB — CBC WITH DIFFERENTIAL/PLATELET
Basophils Absolute: 0 10*3/uL (ref 0.0–0.1)
Basophils Relative: 0.4 % (ref 0.0–3.0)
Eosinophils Absolute: 0.1 10*3/uL (ref 0.0–0.7)
Eosinophils Relative: 1.6 % (ref 0.0–5.0)
HCT: 42.4 % (ref 39.0–52.0)
Hemoglobin: 14.4 g/dL (ref 13.0–17.0)
Lymphocytes Relative: 40.2 % (ref 12.0–46.0)
Lymphs Abs: 1.5 10*3/uL (ref 0.7–4.0)
MCHC: 33.9 g/dL (ref 30.0–36.0)
MCV: 97.1 fl (ref 78.0–100.0)
Monocytes Absolute: 0.4 10*3/uL (ref 0.1–1.0)
Monocytes Relative: 10 % (ref 3.0–12.0)
Neutro Abs: 1.8 10*3/uL (ref 1.4–7.7)
Neutrophils Relative %: 47.8 % (ref 43.0–77.0)
Platelets: 174 10*3/uL (ref 150.0–400.0)
RBC: 4.37 Mil/uL (ref 4.22–5.81)
RDW: 14.1 % (ref 11.5–15.5)
WBC: 3.7 10*3/uL — ABNORMAL LOW (ref 4.0–10.5)

## 2021-02-25 LAB — HEMOGLOBIN A1C: Hgb A1c MFr Bld: 6.9 % — ABNORMAL HIGH (ref 4.6–6.5)

## 2021-02-25 LAB — LIPID PANEL
Cholesterol: 108 mg/dL (ref 0–200)
HDL: 59.7 mg/dL (ref >39.00)
LDL Cholesterol: 32 mg/dL (ref 0–99)
NonHDL: 48.04
Total CHOL/HDL Ratio: 2
Triglycerides: 78 mg/dL (ref 0.0–149.0)
VLDL: 15.6 mg/dL (ref 0.0–40.0)

## 2021-02-25 MED ORDER — SILDENAFIL CITRATE 100 MG PO TABS: 50.0000 mg | ORAL_TABLET | Freq: Every day | ORAL | 11 refills | Status: DC | PRN

## 2021-02-25 NOTE — Patient Instructions (Addendum)
-  Nice seeing you today!!  -Lab work today; will notify you once results are available.  -Schedule follow up in 3 months for your physical. 

## 2021-02-25 NOTE — Progress Notes (Signed)
Established Patient Office Visit     This visit occurred during the SARS-CoV-2 public health emergency.  Safety protocols were in place, including screening questions prior to the visit, additional usage of staff PPE, and extensive cleaning of exam room while observing appropriate contact time as indicated for disinfecting solutions.    CC/Reason for Visit: Discuss some acute concerns  HPI: Christian Vance is a 60 y.o. male who is coming in today for the above mentioned reasons.  He is here today to discuss 2 main issues:  1.  He experienced dizziness about 2 days of last week.  Has had no recurrence since.  This has not happened before to him.  He says it felt like he was a little unbalanced.  Not a true vertigo sensation.  He denies chest pain, no shortness of breath, has not had any focal neurologic deficits.  He did not check his blood pressure or blood sugar during these episodes.  2.  Since we last spoke he has had some erectile dysfunction with some decreased libido and premature ejaculation.   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  followed by [facility] ID service.      Past Surgical History:  Procedure Laterality Date   BIOPSY  01/19/2020   Procedure: BIOPSY;  Surgeon: Ladene Artist, MD;  Location: [place];  Service: Endoscopy;;   COLONOSCOPY WITH PROPOFOL N/A 01/19/2020   Procedure: COLONOSCOPY WITH PROPOFOL;  Surgeon: Ladene Artist, MD;  Location: [facility] ENDOSCOPY;  Service: Endoscopy;  Laterality: N/A;   ESOPHAGOGASTRODUODENOSCOPY (EGD) WITH PROPOFOL N/A 01/19/2020   Procedure: ESOPHAGOGASTRODUODENOSCOPY (EGD) WITH PROPOFOL;  Surgeon: Ladene Artist, MD;  Location: [facility] ENDOSCOPY;  Service: Endoscopy;  Laterality: N/A;   HOT HEMOSTASIS N/A 01/19/2020   Procedure: HOT HEMOSTASIS (ARGON PLASMA COAGULATION/BICAP);  Surgeon:  Ladene Artist, MD;  Location: [facility] ENDOSCOPY;  Service: Endoscopy;  Laterality: N/A;   POLYPECTOMY  01/19/2020   Procedure: POLYPECTOMY;  Surgeon: Ladene Artist, MD;  Location: [facility] ENDOSCOPY;  Service: Endoscopy;;    Social History:  reports that he has never smoked. He has never used smokeless tobacco. He reports that he does not drink alcohol and does not use drugs.  Allergies: No Known Allergies  Family History:  Family History  Problem Relation Age of Onset   Colon cancer Neg Hx      Current Outpatient Medications:    acetaminophen (TYLENOL) 650 MG CR tablet, Take 650 mg by mouth 5 (five) times daily as needed for pain., Disp: , Rfl:    cetirizine (ZYRTEC) 10 MG tablet, Take 1 tablet (10 mg total) by mouth daily., Disp: 90 tablet, Rfl: 1   metFORMIN (GLUCOPHAGE) 1000 MG tablet, TAKE 1 TABLET(1000 MG) BY MOUTH TWICE DAILY WITH A MEAL, Disp: 180 tablet, Rfl: 0   Multiple Vitamin (MULTIVITAMIN WITH MINERALS) TABS tablet, Take 1 tablet by mouth daily. Centrum, Disp: , Rfl:    sildenafil (VIAGRA) 100 MG tablet, Take 0.5-1 tablets (50-100 mg total) by mouth daily as needed for erectile dysfunction., Disp: 5 tablet, Rfl: 11   pantoprazole (PROTONIX) 40 MG tablet, Take 1 tablet (40 mg total) by mouth daily. (Patient not taking: Reported on 02/25/2021), Disp: 60 tablet, Rfl: 0  Review of Systems:  Constitutional: Denies fever, chills, diaphoresis, appetite change and fatigue.  HEENT: Denies photophobia, eye  pain, redness, hearing loss, ear pain, congestion, sore throat, rhinorrhea, sneezing, mouth sores, trouble swallowing, neck pain, neck stiffness and tinnitus.   Respiratory: Denies SOB, DOE, cough, chest tightness,  and wheezing.   Cardiovascular: Denies chest pain, palpitations and leg swelling.  Gastrointestinal: Denies nausea, vomiting, abdominal pain, diarrhea, constipation, blood in stool and abdominal distention.  Genitourinary: Denies dysuria, urgency, frequency, hematuria,  flank pain and difficulty urinating.  Endocrine: Denies: hot or cold intolerance, sweats, changes in hair or nails, polyuria, polydipsia. Musculoskeletal: Denies myalgias, back pain, joint swelling, arthralgias and gait problem.  Skin: Denies pallor, rash and wound.  Neurological: Denies seizures, syncope, weakness, light-headedness, numbness and headaches.  Hematological: Denies adenopathy. Easy bruising, personal or family bleeding history  Psychiatric/Behavioral: Denies suicidal ideation, mood changes, confusion, nervousness, sleep disturbance and agitation    Physical Exam: Vitals:   02/25/21 1114  BP: 118/78  Pulse: 71  Temp: 98 F (36.7 C)  TempSrc: Oral  SpO2: 98%  Weight: 174 lb 6.4 oz (79.1 kg)  Height: 5\' 11"  (1.803 m)    Body mass index is 24.32 kg/m.   Constitutional: NAD, calm, comfortable Eyes: PERRL, lids and conjunctivae normal, wears corrective lenses ENMT: Mucous membranes are moist.  Respiratory: clear to auscultation bilaterally, no wheezing, no crackles. Normal respiratory effort. No accessory muscle use.  Cardiovascular: Regular rate and rhythm, no murmurs / rubs / gallops. No extremity edema.  Neurologic: Grossly intact and nonfocal Psychiatric: Normal judgment and insight. Alert and oriented x 3. Normal mood.    Impression and Plan:  Dizziness -Does not appear to be vertigo, I wonder whether he might be having episodes of orthostasis or low blood sugar. 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 -He is not orthostatic in office today. -Check basic labs today and monitor for recurrence.  Erectile dysfunction, unspecified erectile dysfunction type  - Plan: sildenafil (VIAGRA) 100 MG tablet -He will try sildenafil, if issues persist can consider urology referral.  Type 2 diabetes mellitus without complication, without long-term current use of insulin ([place])  - Plan: CBC with Differential/Platelet, Comprehensive metabolic panel,  Hemoglobin A1c, Lipid panel -Last A1c was 6 in September 2021.  Time spent: 33 minutes reviewing chart, interviewing and examining patient and formulating plan of care.   Patient Instructions  -Nice seeing you today!!  -Lab work today; will notify you once results are available.  -Schedule follow up in 3 months for your physical.    Lelon Frohlich, MD [facility] at [facility]

## 2021-05-06 ENCOUNTER — Other Ambulatory Visit: Payer: Self-pay | Admitting: Internal Medicine

## 2021-05-06 DIAGNOSIS — E119 Type 2 diabetes mellitus without complications: Secondary | ICD-10-CM

## 2021-05-13 ENCOUNTER — Encounter: Payer: Self-pay | Admitting: Internal Medicine

## 2021-05-13 ENCOUNTER — Ambulatory Visit: Payer: 59 | Admitting: Internal Medicine

## 2021-05-13 ENCOUNTER — Other Ambulatory Visit: Payer: Self-pay

## 2021-05-13 VITALS — BP 110/60 | HR 75 | Temp 98.4°F | Wt 174.0 lb

## 2021-05-13 DIAGNOSIS — E119 Type 2 diabetes mellitus without complications: Secondary | ICD-10-CM | POA: Diagnosis not present

## 2021-05-13 DIAGNOSIS — R42 Dizziness and giddiness: Secondary | ICD-10-CM

## 2021-05-13 MED ORDER — BLOOD GLUCOSE MONITOR KIT: PACK | 0 refills | Status: DC

## 2021-05-13 NOTE — Progress Notes (Signed)
Acute office Visit     This visit occurred during the SARS-CoV-2 public health emergency.  Safety protocols were in place, including screening questions prior to the visit, additional usage of staff PPE, and extensive cleaning of exam room while observing appropriate contact time as indicated for disinfecting solutions.    CC/Reason for Visit: Dizziness  HPI: Christian Vance is a 60 y.o. male who is coming in today for the above mentioned reasons.  I last saw him in July with complaints of dizziness.  He has had recurrent episodes since then.  It is a true sensation of vertigo.  He had an episode earlier this week but now feels well.  He does not have a glucometer and has not been checking his home blood pressure or CBGs.  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  followed by [facility] ID service.      Past Surgical History:  Procedure Laterality Date   BIOPSY  01/19/2020   Procedure: BIOPSY;  Surgeon: Ladene Artist, MD;  Location: [place];  Service: Endoscopy;;   COLONOSCOPY WITH PROPOFOL N/A 01/19/2020   Procedure: COLONOSCOPY WITH PROPOFOL;  Surgeon: Ladene Artist, MD;  Location: [facility] ENDOSCOPY;  Service: Endoscopy;  Laterality: N/A;   ESOPHAGOGASTRODUODENOSCOPY (EGD) WITH PROPOFOL N/A 01/19/2020   Procedure: ESOPHAGOGASTRODUODENOSCOPY (EGD) WITH PROPOFOL;  Surgeon: Ladene Artist, MD;  Location: [facility] ENDOSCOPY;  Service: Endoscopy;  Laterality: N/A;   HOT HEMOSTASIS N/A 01/19/2020   Procedure: HOT HEMOSTASIS (ARGON PLASMA COAGULATION/BICAP);  Surgeon: Ladene Artist, MD;  Location: [facility] ENDOSCOPY;  Service: Endoscopy;  Laterality: N/A;   POLYPECTOMY  01/19/2020   Procedure: POLYPECTOMY;  Surgeon: Ladene Artist, MD;  Location: [facility] ENDOSCOPY;  Service: Endoscopy;;    Social History:  reports that he has never smoked. He has never  used smokeless tobacco. He reports that he does not drink alcohol and does not use drugs.  Allergies: No Known Allergies  Family History:  Family History  Problem Relation Age of Onset   Colon cancer Neg Hx      Current Outpatient Medications:    acetaminophen (TYLENOL) 650 MG CR tablet, Take 650 mg by mouth 5 (five) times daily as needed for pain., Disp: , Rfl:    blood glucose meter kit and supplies KIT, Dispense based on patient and insurance preference. Use up to four times daily as directed., Disp: 1 each, Rfl: 0   cetirizine (ZYRTEC) 10 MG tablet, Take 1 tablet (10 mg total) by mouth daily., Disp: 90 tablet, Rfl: 1   metFORMIN (GLUCOPHAGE) 1000 MG tablet, TAKE 1 TABLET TWICE A DAY WITH A MEAL, Disp: 180 tablet, Rfl: 0   Multiple Vitamin (MULTIVITAMIN WITH MINERALS) TABS tablet, Take 1 tablet by mouth daily. Centrum, Disp: , Rfl:    pantoprazole (PROTONIX) 40 MG tablet, Take 1 tablet (40 mg total) by mouth daily., Disp: 60 tablet, Rfl: 0   sildenafil (VIAGRA) 100 MG tablet, Take 0.5-1 tablets (50-100 mg total) by mouth daily as needed for erectile dysfunction., Disp: 5 tablet, Rfl: 11  Review of Systems:  Constitutional: Denies fever, chills, diaphoresis, appetite change and fatigue.  HEENT: Denies photophobia, eye pain, redness, hearing loss, ear pain, congestion, sore throat, rhinorrhea, sneezing, mouth sores, trouble swallowing, neck pain, neck stiffness and tinnitus.   Respiratory: Denies SOB, DOE, cough, chest tightness,  and wheezing.   Cardiovascular: Denies chest pain, palpitations and leg swelling.  Gastrointestinal: Denies nausea, vomiting, abdominal pain, diarrhea, constipation, blood in stool and abdominal distention.  Genitourinary: Denies dysuria, urgency, frequency, hematuria, flank pain and difficulty urinating.  Endocrine: Denies: hot or cold intolerance, sweats, changes in hair or nails, polyuria, polydipsia. Musculoskeletal: Denies myalgias, back pain, joint  swelling, arthralgias and gait problem.  Skin: Denies pallor, rash and wound.  Neurological: Denies seizures, syncope, weakness, lnumbness and headaches.  Hematological: Denies adenopathy. Easy bruising, personal or family bleeding history  Psychiatric/Behavioral: Denies suicidal ideation, mood changes, confusion, nervousness, sleep disturbance and agitation    Physical Exam: Vitals:   05/13/21 1511  BP: 110/60  Pulse: 75  Temp: 98.4 F (36.9 C)  TempSrc: Oral  SpO2: 98%  Weight: 174 lb (78.9 kg)    Body mass index is 24.27 kg/m.   Constitutional: NAD, calm, comfortable Eyes: PERRL, lids and conjunctivae normal, wears corrective lenses ENMT: Mucous membranes are moist.  Tympanic membrane is pearly white, no erythema or bulging. Neck: normal, supple, no masses, no thyromegaly Neurologic: Grossly intact and nonfocal Psychiatric: Normal judgment and insight. Alert and oriented x 3. Normal mood.    Impression and Plan:  Dizziness  - Plan: PT vestibular rehab -Given true sensation of vertigo I suspect possibly BPPV. -I will send to vestibular therapy.  Have also given him resources on the Epley maneuver. -I have also advised that he check his blood pressure and blood glucose during these episodes to ensure he is not hypoglycemic or hypotensive.  Type 2 diabetes mellitus without complication, without long-term current use of insulin ([place])  - Plan: blood glucose meter kit and supplies KIT     Dru Primeau [name], MD [facility] at [facility]

## 2021-06-01 ENCOUNTER — Other Ambulatory Visit: Payer: Self-pay

## 2021-06-02 ENCOUNTER — Ambulatory Visit: Payer: Self-pay | Admitting: Internal Medicine

## 2021-06-03 ENCOUNTER — Ambulatory Visit: Payer: 59 | Admitting: Internal Medicine

## 2021-06-10 ENCOUNTER — Other Ambulatory Visit: Payer: Self-pay

## 2021-06-11 ENCOUNTER — Encounter: Payer: Self-pay | Admitting: Internal Medicine

## 2021-06-11 ENCOUNTER — Ambulatory Visit (INDEPENDENT_AMBULATORY_CARE_PROVIDER_SITE_OTHER): Payer: 59 | Admitting: Internal Medicine

## 2021-06-11 VITALS — BP 110/70 | HR 87 | Temp 98.2°F | Wt 180.0 lb

## 2021-06-11 DIAGNOSIS — E119 Type 2 diabetes mellitus without complications: Secondary | ICD-10-CM | POA: Diagnosis not present

## 2021-06-11 DIAGNOSIS — R42 Dizziness and giddiness: Secondary | ICD-10-CM | POA: Diagnosis not present

## 2021-06-11 DIAGNOSIS — Z23 Encounter for immunization: Secondary | ICD-10-CM

## 2021-06-11 MED ORDER — BLOOD GLUCOSE MONITOR KIT: PACK | 0 refills | Status: DC

## 2021-06-11 NOTE — Progress Notes (Signed)
Established Patient Office Visit     This visit occurred during the SARS-CoV-2 public health emergency.  Safety protocols were in place, including screening questions prior to the visit, additional usage of staff PPE, and extensive cleaning of exam room while observing appropriate contact time as indicated for disinfecting solutions.    CC/Reason for Visit: Follow-up diabetes  HPI: Christian Vance is a 60 y.o. male who is coming in today for the above mentioned reasons. Past Medical History is significant for: Diabetes type 2 and chronic hep B infection that has been treated.  He has been complaining of vertigo for a few months, referral for vestibular PT has been placed.  He is requesting a prescription for a glucometer and a flu vaccine.   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  followed by [facility] ID service.      Past Surgical History:  Procedure Laterality Date   BIOPSY  01/19/2020   Procedure: BIOPSY;  Surgeon: Ladene Artist, MD;  Location: [place];  Service: Endoscopy;;   COLONOSCOPY WITH PROPOFOL N/A 01/19/2020   Procedure: COLONOSCOPY WITH PROPOFOL;  Surgeon: Ladene Artist, MD;  Location: [facility] ENDOSCOPY;  Service: Endoscopy;  Laterality: N/A;   ESOPHAGOGASTRODUODENOSCOPY (EGD) WITH PROPOFOL N/A 01/19/2020   Procedure: ESOPHAGOGASTRODUODENOSCOPY (EGD) WITH PROPOFOL;  Surgeon: Ladene Artist, MD;  Location: [facility] ENDOSCOPY;  Service: Endoscopy;  Laterality: N/A;   HOT HEMOSTASIS N/A 01/19/2020   Procedure: HOT HEMOSTASIS (ARGON PLASMA COAGULATION/BICAP);  Surgeon: Ladene Artist, MD;  Location: [facility] ENDOSCOPY;  Service: Endoscopy;  Laterality: N/A;   POLYPECTOMY  01/19/2020   Procedure: POLYPECTOMY;  Surgeon: Ladene Artist, MD;  Location: [facility] ENDOSCOPY;  Service: Endoscopy;;    Social History:  reports that he has never  smoked. He has never used smokeless tobacco. He reports that he does not drink alcohol and does not use drugs.  Allergies: No Known Allergies  Family History:  Family History  Problem Relation Age of Onset   Colon cancer Neg Hx      Current Outpatient Medications:    acetaminophen (TYLENOL) 650 MG CR tablet, Take 650 mg by mouth 5 (five) times daily as needed for pain., Disp: , Rfl:    blood glucose meter kit and supplies KIT, Dispense based on patient and insurance preference. Use up to four times daily as directed., Disp: 1 each, Rfl: 0   cetirizine (ZYRTEC) 10 MG tablet, Take 1 tablet (10 mg total) by mouth daily., Disp: 90 tablet, Rfl: 1   metFORMIN (GLUCOPHAGE) 1000 MG tablet, TAKE 1 TABLET TWICE A DAY WITH A MEAL, Disp: 180 tablet, Rfl: 0   Multiple Vitamin (MULTIVITAMIN WITH MINERALS) TABS tablet, Take 1 tablet by mouth daily. Centrum, Disp: , Rfl:    pantoprazole (PROTONIX) 40 MG tablet, Take 1 tablet (40 mg total) by mouth daily., Disp: 60 tablet, Rfl: 0   sildenafil (VIAGRA) 100 MG tablet, Take 0.5-1 tablets (50-100 mg total) by mouth daily as needed for erectile dysfunction., Disp: 5 tablet, Rfl: 11  Review of Systems:  Constitutional: Denies fever, chills, diaphoresis, appetite change and fatigue.  HEENT: Denies photophobia, eye pain, redness, hearing loss, ear pain, congestion, sore throat, rhinorrhea, sneezing, mouth sores, trouble swallowing, neck pain, neck stiffness and tinnitus.   Respiratory: Denies SOB, DOE, cough, chest tightness,  and wheezing.  Cardiovascular: Denies chest pain, palpitations and leg swelling.  Gastrointestinal: Denies nausea, vomiting, abdominal pain, diarrhea, constipation, blood in stool and abdominal distention.  Genitourinary: Denies dysuria, urgency, frequency, hematuria, flank pain and difficulty urinating.  Endocrine: Denies: hot or cold intolerance, sweats, changes in hair or nails, polyuria, polydipsia. Musculoskeletal: Denies myalgias,  back pain, joint swelling, arthralgias and gait problem.  Skin: Denies pallor, rash and wound.  Neurological: Denies  seizures, syncope, weakness,  numbness and headaches.  Hematological: Denies adenopathy. Easy bruising, personal or family bleeding history  Psychiatric/Behavioral: Denies suicidal ideation, mood changes, confusion, nervousness, sleep disturbance and agitation    Physical Exam: Vitals:   06/11/21 1534  BP: 110/70  Pulse: 87  Temp: 98.2 F (36.8 C)  TempSrc: Oral  SpO2: 97%  Weight: 180 lb (81.6 kg)    Body mass index is 25.1 kg/m.   Constitutional: NAD, calm, comfortable Eyes: PERRL, lids and conjunctivae normal, wears corrective lenses ENMT: Mucous membranes are moist.  Respiratory: clear to auscultation bilaterally, no wheezing, no crackles. Normal respiratory effort. No accessory muscle use.  Cardiovascular: Regular rate and rhythm, no murmurs / rubs / gallops. No extremity edema. Neurologic: Grossly intact and nonfocal Psychiatric: Normal judgment and insight. Alert and oriented x 3. Normal mood.    Impression and Plan:  Type 2 diabetes mellitus without complication, without long-term current use of insulin ([place])  - Plan: Microalbumin / creatinine urine ratio -A1c has risen slightly above goal to 7.1, he will continue metformin, he will work on his eating habits and we will reconvene in 3 months for follow-up. -Microalbumin today.  Need for influenza vaccination -Flu vaccine administered today.  Time spent: 22 minutes reviewing chart, interviewing and examining patient and formulating plan of care.     Lelon Frohlich, MD [facility] at [facility]

## 2021-06-11 NOTE — Addendum Note (Signed)
Addended by: Westley Hummer B on: 06/11/2021 04:25 PM   Modules accepted: Orders

## 2021-06-12 NOTE — Addendum Note (Signed)
Addended by: Amanda Cockayne on: 06/12/2021 03:56 PM   Modules accepted: Orders

## 2021-06-13 LAB — MICROALBUMIN / CREATININE URINE RATIO
Creatinine, Urine: 113 mg/dL (ref 20–320)
Microalb, Ur: <0.2 mg/dL

## 2021-06-17 ENCOUNTER — Other Ambulatory Visit: Payer: Self-pay | Admitting: Internal Medicine

## 2021-06-18 ENCOUNTER — Other Ambulatory Visit: Payer: Self-pay | Admitting: Internal Medicine

## 2021-08-24 ENCOUNTER — Ambulatory Visit: Payer: 59 | Attending: Internal Medicine | Admitting: Physical Therapy

## 2021-09-14 ENCOUNTER — Ambulatory Visit: Payer: 59 | Admitting: Internal Medicine

## 2021-09-14 VITALS — BP 120/68 | HR 87 | Temp 98.1°F | Wt 175.6 lb

## 2021-09-14 DIAGNOSIS — H04203 Unspecified epiphora, bilateral lacrimal glands: Secondary | ICD-10-CM

## 2021-09-14 DIAGNOSIS — R42 Dizziness and giddiness: Secondary | ICD-10-CM | POA: Diagnosis not present

## 2021-09-14 DIAGNOSIS — E119 Type 2 diabetes mellitus without complications: Secondary | ICD-10-CM | POA: Diagnosis not present

## 2021-09-14 DIAGNOSIS — H9313 Tinnitus, bilateral: Secondary | ICD-10-CM | POA: Diagnosis not present

## 2021-09-14 LAB — POCT GLYCOSYLATED HEMOGLOBIN (HGB A1C): Hemoglobin A1C: 6.6 % — AB (ref 4.0–5.6)

## 2021-09-14 NOTE — Progress Notes (Signed)
Established Patient Office Visit     This visit occurred during the SARS-CoV-2 public health emergency.  Safety protocols were in place, including screening questions prior to the visit, additional usage of staff PPE, and extensive cleaning of exam room while observing appropriate contact time as indicated for disinfecting solutions.    CC/Reason for Visit: Follow-up chronic conditions and discuss acute concerns  HPI: Christian Vance is a 61 y.o. male who is coming in today for the above mentioned reasons. Past Medical History is significant for: Type 2 diabetes and treated chronic hepatitis B.  He has been complaining of continued vertigo and dizziness now for about 4 months.  He was referred to vestibular therapy but states he was never called to make appointment.  Now he states for about 4 weeks he has been having tinnitus and hearing loss that he thinks is the right ear but is not sure.  He has also been noticing excessive lacrimation this has been ongoing for a few weeks as well.   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  followed [facility].      Past Surgical History:  Procedure Laterality Date   BIOPSY  01/19/2020   Procedure: BIOPSY;  Surgeon: Ladene Artist, MD;  Location: [place];  Service: Endoscopy;;   COLONOSCOPY WITH PROPOFOL N/A 01/19/2020   Procedure: COLONOSCOPY WITH PROPOFOL;  Surgeon: Ladene Artist, MD;  Location: [facility] ENDOSCOPY;  Service: Endoscopy;  Laterality: N/A;   ESOPHAGOGASTRODUODENOSCOPY (EGD) WITH PROPOFOL N/A 01/19/2020   Procedure: ESOPHAGOGASTRODUODENOSCOPY (EGD) WITH PROPOFOL;  Surgeon: Ladene Artist, MD;  Location: [facility] ENDOSCOPY;  Service: Endoscopy;  Laterality: N/A;   HOT HEMOSTASIS N/A 01/19/2020   Procedure: HOT HEMOSTASIS (ARGON PLASMA COAGULATION/BICAP);  Surgeon: Ladene Artist,  MD;  Location: [facility] ENDOSCOPY;  Service: Endoscopy;  Laterality: N/A;   POLYPECTOMY  01/19/2020   Procedure: POLYPECTOMY;  Surgeon: Ladene Artist, MD;  Location: [facility] ENDOSCOPY;  Service: Endoscopy;;    Social History:  reports that he has never smoked. He has never used smokeless tobacco. He reports that he does not drink alcohol and does not use drugs.  Allergies: No Known Allergies  Family History:  Family History  Problem Relation Age of Onset   Colon cancer Neg Hx      Current Outpatient Medications:    ACCU-CHEK GUIDE test strip, USE AS DIRECTED 4 TIMES PER DAY, Disp: 100 strip, Rfl: 2   Accu-Chek Softclix Lancets lancets, USE AS DIRECTED 4 TIMES A DAY, Disp: 100 each, Rfl: 3   acetaminophen (TYLENOL) 650 MG CR tablet, Take 650 mg by mouth 5 (five) times daily as needed for pain., Disp: , Rfl:    blood glucose meter kit and supplies KIT, Dispense based on patient and insurance preference. Use up to four times daily as directed., Disp: 1 each, Rfl: 0   cetirizine (ZYRTEC) 10 MG tablet, Take 1 tablet (10 mg total) by mouth daily., Disp: 90 tablet, Rfl: 1   metFORMIN (GLUCOPHAGE) 1000 MG tablet, TAKE 1 TABLET TWICE A DAY WITH A MEAL, Disp: 180 tablet, Rfl: 0   Multiple Vitamin (MULTIVITAMIN WITH MINERALS) TABS tablet, Take 1 tablet by mouth daily. Centrum, Disp: , Rfl:    pantoprazole (PROTONIX) 40 MG tablet, Take 1 tablet (40 mg total) by mouth daily., Disp: 60 tablet,  Rfl: 0   sildenafil (VIAGRA) 100 MG tablet, Take 0.5-1 tablets (50-100 mg total) by mouth daily as needed for erectile dysfunction., Disp: 5 tablet, Rfl: 11  Review of Systems:  Constitutional: Denies fever, chills, diaphoresis, appetite change and fatigue.  HEENT: Denies photophobia, eye pain, redness,  congestion, sore throat, rhinorrhea, sneezing, mouth sores, trouble swallowing, neck pain, neck stiffness and tinnitus.   Respiratory: Denies SOB, DOE, cough, chest tightness,  and wheezing.   Cardiovascular: Denies  chest pain, palpitations and leg swelling.  Gastrointestinal: Denies nausea, vomiting, abdominal pain, diarrhea, constipation, blood in stool and abdominal distention.  Genitourinary: Denies dysuria, urgency, frequency, hematuria, flank pain and difficulty urinating.  Endocrine: Denies: hot or cold intolerance, sweats, changes in hair or nails, polyuria, polydipsia. Musculoskeletal: Denies myalgias, back pain, joint swelling, arthralgias and gait problem.  Skin: Denies pallor, rash and wound.  Neurological: Denies  seizures, syncope, weakness, light-headedness, numbness and headaches.  Hematological: Denies adenopathy. Easy bruising, personal or family bleeding history  Psychiatric/Behavioral: Denies suicidal ideation, mood changes, confusion, nervousness, sleep disturbance and agitation    Physical Exam: Vitals:   09/14/21 1306  BP: 120/68  Pulse: 87  Temp: 98.1 F (36.7 C)  TempSrc: Oral  SpO2: 96%  Weight: 175 lb 9.6 oz (79.7 kg)    Body mass index is 24.49 kg/m.   Constitutional: NAD, calm, comfortable Eyes: PERRL, lids and conjunctivae normal, wears corrective lenses, no signs of conjunctivitis. ENMT: Mucous membranes are moist.  Respiratory: clear to auscultation bilaterally, no wheezing, no crackles. Normal respiratory effort. No accessory muscle use.  Cardiovascular: Regular rate and rhythm, no murmurs / rubs / gallops. No extremity edema.  Neurologic: Grossly intact and nonfocal Psychiatric: Normal judgment and insight. Alert and oriented x 3. Normal mood.    Impression and Plan:  Type 2 diabetes mellitus without complication, without long-term current use of insulin ([place])  - Plan: POCT HgB A1C -In office A1c demonstrates good control at 6.6.  Vertigo  Tinnitus of both ears - Plan: MR Brain Wo Contrast, Ambulatory Referral to Neuro Rehab -Replace referral for vestibular therapy. -With addition of tinnitus on top of vertigo, I am somewhat concerned for the  possibility of acoustic neuroma, will schedule for MRI brain.  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  Time spent: 32 minutes reviewing chart, interviewing and examining patient and formulating plan of care.     Lelon Frohlich, MD [facility]

## 2021-09-29 ENCOUNTER — Telehealth: Payer: Self-pay | Admitting: Internal Medicine

## 2021-09-29 NOTE — Telephone Encounter (Signed)
Lft pt vm on mobile to call ofc to sch MRI. Pt main line was busy. thanks

## 2021-09-30 ENCOUNTER — Telehealth: Payer: Self-pay | Admitting: Internal Medicine

## 2021-09-30 NOTE — Telephone Encounter (Signed)
Lft pt vm on mobile to call ofc to sch MRI. thanks

## 2021-10-02 ENCOUNTER — Telehealth: Payer: Self-pay | Admitting: Internal Medicine

## 2021-10-02 NOTE — Telephone Encounter (Signed)
Lft pt vm on cell to call ofc to sch MRI and home number is busy. Thanks

## 2021-10-06 ENCOUNTER — Telehealth: Payer: Self-pay | Admitting: Internal Medicine

## 2021-10-06 NOTE — Telephone Encounter (Signed)
Lft pt vm to call ofc to sch MRI. thanks 

## 2021-10-23 ENCOUNTER — Ambulatory Visit (HOSPITAL_COMMUNITY): Admission: RE | Admit: 2021-10-23 | Payer: 59 | Source: Ambulatory Visit

## 2021-10-23 ENCOUNTER — Ambulatory Visit: Payer: 59

## 2021-11-10 ENCOUNTER — Emergency Department (HOSPITAL_COMMUNITY): Payer: 59

## 2021-11-10 ENCOUNTER — Other Ambulatory Visit: Payer: Self-pay

## 2021-11-10 ENCOUNTER — Inpatient Hospital Stay (HOSPITAL_COMMUNITY)
Admission: EM | Admit: 2021-11-10 | Discharge: 2021-11-16 | DRG: 493 | Disposition: A | Discharge status: Home Health | Payer: 59 | Attending: Student | Admitting: Student

## 2021-11-10 ENCOUNTER — Encounter (HOSPITAL_COMMUNITY): Payer: Self-pay

## 2021-11-10 DIAGNOSIS — S82841A Displaced bimalleolar fracture of right lower leg, initial encounter for closed fracture: Secondary | ICD-10-CM

## 2021-11-10 DIAGNOSIS — S42022A Displaced fracture of shaft of left clavicle, initial encounter for closed fracture: Secondary | ICD-10-CM | POA: Diagnosis present

## 2021-11-10 DIAGNOSIS — K5900 Constipation, unspecified: Secondary | ICD-10-CM | POA: Diagnosis present

## 2021-11-10 DIAGNOSIS — Y92410 Unspecified street and highway as the place of occurrence of the external cause: Secondary | ICD-10-CM

## 2021-11-10 DIAGNOSIS — E119 Type 2 diabetes mellitus without complications: Secondary | ICD-10-CM | POA: Diagnosis present

## 2021-11-10 DIAGNOSIS — S82851A Displaced trimalleolar fracture of right lower leg, initial encounter for closed fracture: Principal | ICD-10-CM | POA: Diagnosis present

## 2021-11-10 DIAGNOSIS — D62 Acute posthemorrhagic anemia: Secondary | ICD-10-CM | POA: Diagnosis not present

## 2021-11-10 DIAGNOSIS — N400 Enlarged prostate without lower urinary tract symptoms: Secondary | ICD-10-CM | POA: Diagnosis present

## 2021-11-10 DIAGNOSIS — K529 Noninfective gastroenteritis and colitis, unspecified: Secondary | ICD-10-CM | POA: Diagnosis present

## 2021-11-10 DIAGNOSIS — S42032A Displaced fracture of lateral end of left clavicle, initial encounter for closed fracture: Principal | ICD-10-CM

## 2021-11-10 DIAGNOSIS — K5790 Diverticulosis of intestine, part unspecified, without perforation or abscess without bleeding: Secondary | ICD-10-CM | POA: Diagnosis present

## 2021-11-10 DIAGNOSIS — S82891A Other fracture of right lower leg, initial encounter for closed fracture: Secondary | ICD-10-CM

## 2021-11-10 MED ORDER — ACETAMINOPHEN 325 MG PO TABS: 650.0000 mg | ORAL_TABLET | Freq: Four times a day (QID) | ORAL | Status: DC | PRN
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
## 2021-11-11 ENCOUNTER — Emergency Department (HOSPITAL_COMMUNITY): Payer: 59

## 2021-11-11 ENCOUNTER — Inpatient Hospital Stay (HOSPITAL_COMMUNITY): Payer: 59

## 2021-11-11 DIAGNOSIS — K529 Noninfective gastroenteritis and colitis, unspecified: Secondary | ICD-10-CM | POA: Diagnosis present

## 2021-11-11 DIAGNOSIS — S82891A Other fracture of right lower leg, initial encounter for closed fracture: Secondary | ICD-10-CM | POA: Diagnosis present

## 2021-11-11 DIAGNOSIS — E119 Type 2 diabetes mellitus without complications: Secondary | ICD-10-CM | POA: Diagnosis present

## 2021-11-11 DIAGNOSIS — S42022A Displaced fracture of shaft of left clavicle, initial encounter for closed fracture: Secondary | ICD-10-CM | POA: Diagnosis present

## 2021-11-11 DIAGNOSIS — Y92410 Unspecified street and highway as the place of occurrence of the external cause: Secondary | ICD-10-CM | POA: Diagnosis not present

## 2021-11-11 DIAGNOSIS — K759 Inflammatory liver disease, unspecified: Secondary | ICD-10-CM | POA: Diagnosis not present

## 2021-11-11 DIAGNOSIS — N400 Enlarged prostate without lower urinary tract symptoms: Secondary | ICD-10-CM | POA: Diagnosis present

## 2021-11-11 DIAGNOSIS — S42002A Fracture of unspecified part of left clavicle, initial encounter for closed fracture: Secondary | ICD-10-CM | POA: Diagnosis not present

## 2021-11-11 DIAGNOSIS — K5790 Diverticulosis of intestine, part unspecified, without perforation or abscess without bleeding: Secondary | ICD-10-CM | POA: Diagnosis present

## 2021-11-11 DIAGNOSIS — D62 Acute posthemorrhagic anemia: Secondary | ICD-10-CM | POA: Diagnosis not present

## 2021-11-11 DIAGNOSIS — K5900 Constipation, unspecified: Secondary | ICD-10-CM | POA: Diagnosis present

## 2021-11-11 DIAGNOSIS — S82851A Displaced trimalleolar fracture of right lower leg, initial encounter for closed fracture: Secondary | ICD-10-CM | POA: Diagnosis present

## 2021-11-11 LAB — COMPREHENSIVE METABOLIC PANEL
ALT: 21 U/L (ref 0–44)
AST: 31 U/L (ref 15–41)
Albumin: 3.7 g/dL (ref 3.5–5.0)
Alkaline Phosphatase: 63 U/L (ref 38–126)
Anion gap: 4 — ABNORMAL LOW (ref 5–15)
BUN: 11 mg/dL (ref 8–23)
CO2: 26 mmol/L (ref 22–32)
Calcium: 9 mg/dL (ref 8.9–10.3)
Chloride: 106 mmol/L (ref 98–111)
Creatinine, Ser: 0.87 mg/dL (ref 0.61–1.24)
GFR, Estimated: >60 mL/min (ref >60)
Glucose, Bld: 193 mg/dL — ABNORMAL HIGH (ref 70–99)
Potassium: 4.4 mmol/L (ref 3.5–5.1)
Sodium: 136 mmol/L (ref 135–145)
Total Bilirubin: 0.8 mg/dL (ref 0.3–1.2)
Total Protein: 6.9 g/dL (ref 6.5–8.1)

## 2021-11-11 LAB — CBC WITH DIFFERENTIAL/PLATELET
Abs Immature Granulocytes: 0.02 10*3/uL (ref 0.00–0.07)
Basophils Absolute: 0 10*3/uL (ref 0.0–0.1)
Basophils Relative: 0 %
Eosinophils Absolute: 0 10*3/uL (ref 0.0–0.5)
Eosinophils Relative: 1 %
HCT: 41.5 % (ref 39.0–52.0)
Hemoglobin: 13.8 g/dL (ref 13.0–17.0)
Immature Granulocytes: 0 %
Lymphocytes Relative: 20 %
Lymphs Abs: 1.6 10*3/uL (ref 0.7–4.0)
MCH: 32.9 pg (ref 26.0–34.0)
MCHC: 33.3 g/dL (ref 30.0–36.0)
MCV: 98.8 fL (ref 80.0–100.0)
Monocytes Absolute: 0.5 10*3/uL (ref 0.1–1.0)
Monocytes Relative: 7 %
Neutro Abs: 5.9 10*3/uL (ref 1.7–7.7)
Neutrophils Relative %: 72 %
Platelets: 189 10*3/uL (ref 150–400)
RBC: 4.2 MIL/uL — ABNORMAL LOW (ref 4.22–5.81)
RDW: 13.4 % (ref 11.5–15.5)
WBC: 8 10*3/uL (ref 4.0–10.5)
nRBC: 0 % (ref 0.0–0.2)

## 2021-11-11 MED ORDER — METFORMIN HCL 500 MG PO TABS
1000.0000 mg | ORAL_TABLET | Freq: Two times a day (BID) | ORAL | Status: DC
  Administered 2021-11-13 – 2021-11-16 (×6): 1000 mg via ORAL
  Filled 2021-11-11 (×6): qty 2

## 2021-11-11 MED ORDER — ENOXAPARIN SODIUM 40 MG/0.4ML IJ SOSY
40.0000 mg | PREFILLED_SYRINGE | INTRAMUSCULAR | Status: DC
  Administered 2021-11-12: 40 mg via SUBCUTANEOUS
  Filled 2021-11-11: qty 0.4

## 2021-11-11 MED ORDER — INSULIN ASPART 100 UNIT/ML IJ SOLN
0.0000 [IU] | Freq: Three times a day (TID) | INTRAMUSCULAR | Status: DC
  Administered 2021-11-12 (×3): 3 [IU] via SUBCUTANEOUS
  Administered 2021-11-13: 2 [IU] via SUBCUTANEOUS
  Administered 2021-11-13: 8 [IU] via SUBCUTANEOUS

## 2021-11-11 MED ORDER — LORATADINE 10 MG PO TABS
10.0000 mg | ORAL_TABLET | Freq: Every day | ORAL | Status: DC
Start: 2021-11-11 — End: 2021-11-16
  Administered 2021-11-12 – 2021-11-16 (×4): 10 mg via ORAL
  Filled 2021-11-11 (×4): qty 1

## 2021-11-11 MED ORDER — MIDAZOLAM HCL 2 MG/2ML IJ SOLN
INTRAMUSCULAR | Status: DC | PRN
  Administered 2021-11-11: 1 mg via INTRAVENOUS

## 2021-11-11 MED ORDER — METFORMIN HCL 500 MG PO TABS: 1000.0000 mg | ORAL_TABLET | Freq: Two times a day (BID) | ORAL | Status: DC

## 2021-11-11 MED ORDER — OXYCODONE-ACETAMINOPHEN 5-325 MG PO TABS
1.0000 | ORAL_TABLET | Freq: Once | ORAL | Status: AC
  Administered 2021-11-11: 1 via ORAL
  Filled 2021-11-11: qty 1

## 2021-11-11 MED ORDER — METHOCARBAMOL 1000 MG/10ML IJ SOLN: 500.0000 mg | Freq: Four times a day (QID) | INTRAVENOUS | Status: DC | PRN

## 2021-11-11 MED ORDER — IOHEXOL 300 MG/ML  SOLN
100.0000 mL | Freq: Once | INTRAMUSCULAR | Status: AC | PRN
  Administered 2021-11-11: 100 mL via INTRAVENOUS

## 2021-11-11 MED ORDER — HYDROMORPHONE HCL 1 MG/ML IJ SOLN
1.0000 mg | Freq: Once | INTRAMUSCULAR | Status: AC
  Administered 2021-11-11: 1 mg via INTRAVENOUS
  Filled 2021-11-11: qty 1

## 2021-11-11 MED ORDER — PROPOFOL 10 MG/ML IV BOLUS
0.5000 mg/kg | Freq: Once | INTRAVENOUS | Status: AC
Start: 2021-11-11 — End: 2021-11-11
  Administered 2021-11-11: 39.5 mg via INTRAVENOUS
  Filled 2021-11-11: qty 20

## 2021-11-11 MED ORDER — MORPHINE SULFATE (PF) 2 MG/ML IV SOLN
2.0000 mg | INTRAVENOUS | Status: DC | PRN
  Administered 2021-11-11 – 2021-11-14 (×4): 2 mg via INTRAVENOUS
  Filled 2021-11-11 (×4): qty 1

## 2021-11-11 MED ORDER — ONDANSETRON 4 MG PO TBDP
4.0000 mg | ORAL_TABLET | Freq: Once | ORAL | Status: AC
  Administered 2021-11-11: 4 mg via ORAL
  Filled 2021-11-11: qty 1

## 2021-11-11 MED ORDER — METHOCARBAMOL 500 MG PO TABS
500.0000 mg | ORAL_TABLET | Freq: Four times a day (QID) | ORAL | Status: DC | PRN
  Administered 2021-11-12 – 2021-11-16 (×8): 500 mg via ORAL
  Filled 2021-11-11 (×8): qty 1

## 2021-11-11 MED ORDER — OXYCODONE HCL 5 MG PO TABS
5.0000 mg | ORAL_TABLET | ORAL | Status: DC | PRN
  Administered 2021-11-11: 10 mg via ORAL
  Administered 2021-11-12: 5 mg via ORAL
  Administered 2021-11-13 (×2): 10 mg via ORAL
  Administered 2021-11-13: 15 mg via ORAL
  Administered 2021-11-14: 5 mg via ORAL
  Administered 2021-11-14: 10 mg via ORAL
  Administered 2021-11-14: 5 mg via ORAL
  Administered 2021-11-15 – 2021-11-16 (×6): 10 mg via ORAL
  Filled 2021-11-11: qty 1
  Filled 2021-11-11: qty 2
  Filled 2021-11-11: qty 1
  Filled 2021-11-11 (×2): qty 2
  Filled 2021-11-11: qty 3
  Filled 2021-11-11: qty 1
  Filled 2021-11-11: qty 2
  Filled 2021-11-11: qty 1
  Filled 2021-11-11 (×6): qty 2

## 2021-11-11 MED ORDER — MIDAZOLAM HCL 2 MG/2ML IJ SOLN
1.0000 mg | Freq: Once | INTRAMUSCULAR | Status: DC
  Filled 2021-11-11: qty 2

## 2021-11-11 MED ORDER — ONDANSETRON HCL 4 MG PO TABS: 4.0000 mg | ORAL_TABLET | Freq: Four times a day (QID) | ORAL | Status: DC | PRN

## 2021-11-11 MED ORDER — ONDANSETRON HCL 4 MG/2ML IJ SOLN
4.0000 mg | Freq: Once | INTRAMUSCULAR | Status: AC
  Administered 2021-11-11: 4 mg via INTRAVENOUS
  Filled 2021-11-11: qty 2

## 2021-11-11 MED ORDER — ONDANSETRON HCL 4 MG/2ML IJ SOLN: 4.0000 mg | Freq: Four times a day (QID) | INTRAMUSCULAR | Status: DC | PRN

## 2021-11-11 MED ORDER — ETOMIDATE 2 MG/ML IV SOLN
10.0000 mg | Freq: Once | INTRAVENOUS | Status: DC
  Filled 2021-11-11: qty 10

## 2021-11-11 MED ORDER — ETOMIDATE 2 MG/ML IV SOLN
INTRAVENOUS | Status: DC | PRN
  Administered 2021-11-11: 10 mg via INTRAVENOUS

## 2021-11-11 NOTE — ED Provider Notes (Signed)
Pt signed out by Dr. Betsey Holiday pending CT scans of head/c-spine, chest/abd/pelvis. ? ?  ?IMPRESSION:  ?1. Comminuted distal left clavicle shaft fracture with mild  ?spreading of the comminution fragments. There is overlying stranding  ?but no space-occupying hematoma.  ?2. No other acute trauma related findings in the chest, abdomen or  ?pelvis. There is mild wedging of the T3-7 vertebrae without  ?retropulsion, but this appears to have been present on the 2017  ?chest x-ray.  ?3. Bullous lesion in the base of the right upper lobe. No other  ?significant focal lung abnormality.  ?4. Mildly steatotic liver.  ?5. Slightly prominent pancreatic duct but no pancreatic mass  ?enhancement.  ?6. Gastroenteritis, constipation and diverticulosis.  ?7. Prostatomegaly.  ?8. Tiny hypodensities in both kidneys which are too small to  ?characterize.  ? ?X-ray ankle post reduction: ? ?  ?IMPRESSION:  ?Partial reduction of ankle fractures.  ?   ? ? ?Pt placed in a left shoulder sling and d/w ortho. ? ?As the ankle is not properly reduced, Silvestre Gunner (ortho PA) recommended re-sedation with reduction.  This was done and repeat xray shows: ? ?  ?IMPRESSION:  ?1. Interval restoration of anatomic alignment in the ankle joint.  ?2. Trimalleolar fractures, as before.  ? ?Pt unable to handle ambulation even with a platform walker,so ortho will admit. ?  ?Isla Pence, MD ?11/11/21 1553 ? ?
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
## 2021-11-11 NOTE — Progress Notes (Signed)
Orthopedic Tech Progress Note ?Patient Details:  ?Christian Vance ?12/23/1960 ?[id number] ? ?ORTHO PA came and did a reduction of the ankle was told to apply new splint  ? ?Ortho Devices ?Type of Ortho Device: Stirrup splint, Short leg splint ?Ortho Device/Splint Location: RLE ?Ortho Device/Splint Interventions: Ordered, Application, Adjustment ?  ?Post Interventions ?Patient Tolerated: Well ?Instructions Provided: Care of device ? ?[name] ?11/11/2021, 12:19 PM ? ?

## 2021-11-11 NOTE — ED Provider Notes (Signed)
?  Physical Exam  ?BP 126/77   Pulse 99   Temp 99 ?F (37.2 ?C) (Oral)   Resp 18   Ht '5\' 11"'$  (1.803 m)   Wt 79 kg   SpO2 98%   BMI 24.29 kg/m?  ? ?Physical Exam ? ?Procedures  ?.Sedation ? ?Date/Time: 11/11/2021 12:54 PM ?Performed by: Isla Pence, MD ?Authorized by: Isla Pence, MD  ? ?Consent:  ?  Consent obtained:  Written ?  Consent given by:  Patient ?Universal protocol:  ?  Immediately prior to procedure, a time out was called: yes   ?  Patient identity confirmed:  Verbally with patient ?Indications:  ?  Procedure performed:  Dislocation reduction ?  Procedure necessitating sedation performed by:  Different physician ?Pre-sedation assessment:  ?  Time since last food or drink:  12 ?  ASA classification: class 2 - patient with mild systemic disease   ?  Mallampati score:  I - soft palate, uvula, fauces, pillars visible ?  Pre-sedation assessments completed and reviewed: airway patency, cardiovascular function, hydration status, mental status, nausea/vomiting, pain level, respiratory function and temperature   ?Procedure details (see MAR for exact dosages):  ?  Preoxygenation:  Nasal cannula ?  Sedation:  Etomidate and midazolam ?  Intended level of sedation: deep ?  Analgesia:  Hydromorphone ?  Intra-procedure events: none   ?  Total Provider sedation time (minutes):  30 ?Post-procedure details:  ?  Post-sedation assessment completed:  11/11/2021 12:55 PM ?  Attendance: Constant attendance by certified staff until patient recovered   ?  Recovery: Patient returned to pre-procedure baseline   ?  Patient is stable for discharge or admission: yes   ?  Procedure completion:  Tolerated well, no immediate complications ? ?ED Course / MDM  ?  ?Medical Decision Making ?Amount and/or Complexity of Data Reviewed ?Labs: ordered. ?Radiology: ordered. ? ?Risk ?Prescription drug management. ? ? ? ? ? ? ?  ?Isla Pence, MD ?11/11/21 1256 ? ?
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
## 2021-11-11 NOTE — Progress Notes (Signed)
PT Cancellation Note ? ?Patient Details ?Name: Christian Vance ?MRN: [id number] ?DOB: May 11, 1961 ? ? ?Cancelled Treatment:    Reason Eval/Treat Not Completed: Medical issues which prohibited therapy Pt awaiting x-ray following reduction. Will follow up as schedule allows.  ? ?Reuel Derby, PT, DPT  ?Acute Rehabilitation Services  ?Pager: [phone] ?Office: [phone] ? ? ? ?[place] ?11/11/2021, 11:47 AM ?

## 2021-11-11 NOTE — Progress Notes (Signed)
Orthopedic Tech Progress Note ?Patient Details:  ?Christian Vance ?Mar 03, 1961 ?[id number] ? ?Ortho Devices ?Type of Ortho Device: Stirrup splint, Short leg splint ?Ortho Device/Splint Location: rle ?Ortho Device/Splint Interventions: Ordered, Application, Adjustment ?  ?Post Interventions ?Patient Tolerated: Well ? ?[name] ?11/11/2021, 4:17 AM ? ?
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
## 2021-11-11 NOTE — ED Notes (Signed)
Informed consent signed 

## 2021-11-11 NOTE — ED Notes (Signed)
Patient transported to CT 

## 2021-11-12 ENCOUNTER — Encounter (HOSPITAL_COMMUNITY): Payer: Self-pay | Admitting: Orthopedic Surgery

## 2021-11-12 LAB — MRSA NEXT GEN BY PCR, NASAL: MRSA by PCR Next Gen: NOT DETECTED

## 2021-11-12 LAB — CBG MONITORING, ED
Glucose-Capillary: 166 mg/dL — ABNORMAL HIGH (ref 70–99)
Glucose-Capillary: 154 mg/dL — ABNORMAL HIGH (ref 70–99)

## 2021-11-12 LAB — GLUCOSE, CAPILLARY: Glucose-Capillary: 184 mg/dL — ABNORMAL HIGH (ref 70–99)

## 2021-11-12 NOTE — ED Notes (Signed)
PT finished with pt. Pain increased. Pt using IS. Wife at [facility].  ?

## 2021-11-12 NOTE — Plan of Care (Signed)
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
## 2021-11-12 NOTE — ED Notes (Signed)
Pt eating, wife at [facility], pain improved, NAD, calm, interactive, VSS.  ?

## 2021-11-12 NOTE — Progress Notes (Signed)
? ?Subjective: ? ?Christian Vance is a 60 y.o. male,    ?  ? ? ?Patient reports pain as mild to moderate.  Reports pain is well controlled on medication.  Resting comfortably in the bed eating lunch.  Denies numbness or tingling.  Denies fever or chills.,  Reports he did have some pain and difficulty when trying to work with the physical therapist.   ? ?Patient reports that he does live at a apartment building second-floor has 15 steps to get up.  He is accompanied by his wife at the bedside. ? ?Objective:  ? ?VITALS:   ?Vitals:  ? 11/12/21 1200 11/12/21 1215 11/12/21 1230 11/12/21 1245  ?BP: 117/77 116/70 122/79 128/79  ?Pulse: (!) 103 98 97 (!) 101  ?Resp:      ?Temp:      ?TempSrc:      ?SpO2: 92% 92% 95% 96%  ?Weight:      ?Height:      ? ?Constitutional: General Appearance: healthy-appearing, well-nourished, and well-developed. ?Level of Distress: no acute distress. ?Eyes: Lens (normal) clear: both eyes. ?Head: Head: normocephalic and atraumatic. ?Lungs: Respiratory effort: no dyspnea. ?Skin: Inspection and palpation: no rash, lesions ?Neurologic: Cranial Nerves: grossly intact. Sensation: grossly intact. ?Psychiatric: Insight: good judgement and insight. Mental Status: normal mood and affect and active and alert. Orientation: to time, place, and person. ? ? ? ?Left Upper Extremity:  ? ?INSPECTION & PALPATION: ?No gross deformity of the clavicle with tenderness to palpation over the fracture site.  No open wounds.  No skin tenting, sling present. ?  ?SENSORY: sensation is intact to light touch in:  ?superficial radial nerve distribution (dorsal first web space) ?median nerve distribution (tip of index finger)   ?ulnar nerve distribution (tip of small finger)    ?Axillary nerve distribution (lateral shoulder) ?  ?ROM: Full elbow, wrist, and digit range of motion. ?  ?MOTOR:  ?+ motor posterior interosseous nerve (thumb IP extension) ?+ anterior interosseous nerve (thumb IP flexion, index finger DIP flexion) ?+  radial nerve (wrist extension) ?+ median nerve (palpable firing thenar mass) ?+ ulnar nerve (palpable firing of first dorsal interosseous muscle) ? ?  ?VASCULAR: ?2+ radial pulse, brisk capillary refill < 2 sec, fingers warm and well-perfused ?  ? ?Right lower extremity: ?Splint present, intact.  Good capillary refill, able to wiggle toes.  Mild swelling of the toes.  Sensation intact all digits.  No calf tenderness.  No tenderness to the knee. ? ?Lab Results  ?Component Value Date  ? WBC 8.0 11/11/2021  ? HGB 13.8 11/11/2021  ? HCT 41.5 11/11/2021  ? MCV 98.8 11/11/2021  ? PLT 189 11/11/2021  ? ?BMET ?   ?Component Value Date/Time  ? NA 136 11/11/2021 0321  ? K 4.4 11/11/2021 0321  ? CL 106 11/11/2021 0321  ? CO2 26 11/11/2021 0321  ? GLUCOSE 193 (H) 11/11/2021 0321  ? BUN 11 11/11/2021 0321  ? CREATININE 0.87 11/11/2021 0321  ? CALCIUM 9.0 11/11/2021 0321  ? GFRNONAA >60 11/11/2021 0321  ? ? ? ?Assessment/Plan: ?   ? ?Principal Problem: ?  Closed right ankle fracture ? ? ?N.p.o. after midnight tonight for ORIF by Dr. Doreatha Martin of the right ankle and left clavicle. ? ?Weightbearing Status: Nonweightbearing to the right lower extremity, nonweightbearing to the left upper extremity. ?DVT Prophylaxis: Lovenox ? ?Discussed admission to the floor and further evaluation for disposition, may potentially need CIR or SNF depending on progress. ? ? ?[name] ?11/12/2021, 1:25  PM ? ?Jonelle Sidle PA-C  ?Physician Assistant with Dr. Lillia Abed [place] ? ?

## 2021-11-12 NOTE — ED Notes (Addendum)
Pt alert, NAD, calm, interactive, resps e/u, encouraged deep breaths, CMS intact x4, MAEx4, R ankle splint CDI, L arm sling in place. Reports minimal pain. Extremities warm, cap refill <2sec x4. ?

## 2021-11-12 NOTE — Progress Notes (Signed)
May need home health or snf ?

## 2021-11-12 NOTE — Progress Notes (Signed)
? ?  Inpatient Rehab Admissions Coordinator : ? ?Per therapy recommendations patient was screened for CIR candidacy by Matricia Begnaud RN MSN. Patient does not appear to demonstrate the medical neccesity for a Hospital Rehabilitation /CIR admit. I will not place a Rehab Consult. Recommend other Rehab Venues to be pursued. Please contact me with any questions. ? ?Hena Ewalt RN MSN ?Admissions Coordinator ?[phone]  ?

## 2021-11-13 ENCOUNTER — Inpatient Hospital Stay (HOSPITAL_COMMUNITY): Payer: 59 | Admitting: Anesthesiology

## 2021-11-13 ENCOUNTER — Other Ambulatory Visit: Payer: Self-pay

## 2021-11-13 ENCOUNTER — Encounter (HOSPITAL_COMMUNITY)
Admission: EM | Disposition: A | Discharge status: Home Health | Payer: Self-pay | Source: Home / Self Care | Attending: Student

## 2021-11-13 ENCOUNTER — Inpatient Hospital Stay (HOSPITAL_COMMUNITY): Payer: 59

## 2021-11-13 ENCOUNTER — Encounter (HOSPITAL_COMMUNITY): Payer: Self-pay | Admitting: Orthopedic Surgery

## 2021-11-13 DIAGNOSIS — E119 Type 2 diabetes mellitus without complications: Secondary | ICD-10-CM

## 2021-11-13 DIAGNOSIS — S82851A Displaced trimalleolar fracture of right lower leg, initial encounter for closed fracture: Secondary | ICD-10-CM

## 2021-11-13 DIAGNOSIS — S42002A Fracture of unspecified part of left clavicle, initial encounter for closed fracture: Secondary | ICD-10-CM

## 2021-11-13 DIAGNOSIS — K759 Inflammatory liver disease, unspecified: Secondary | ICD-10-CM

## 2021-11-13 HISTORY — PX: ORIF CLAVICULAR FRACTURE: SHX5055

## 2021-11-13 HISTORY — PX: ORIF ANKLE FRACTURE: SHX5408

## 2021-11-13 LAB — GLUCOSE, CAPILLARY
Glucose-Capillary: 134 mg/dL — ABNORMAL HIGH (ref 70–99)
Glucose-Capillary: 137 mg/dL — ABNORMAL HIGH (ref 70–99)
Glucose-Capillary: 145 mg/dL — ABNORMAL HIGH (ref 70–99)
Glucose-Capillary: 268 mg/dL — ABNORMAL HIGH (ref 70–99)
Glucose-Capillary: 282 mg/dL — ABNORMAL HIGH (ref 70–99)
Glucose-Capillary: 194 mg/dL — ABNORMAL HIGH (ref 70–99)

## 2021-11-13 LAB — VITAMIN D 25 HYDROXY (VIT D DEFICIENCY, FRACTURES): Vit D, 25-Hydroxy: 33.27 ng/mL (ref 30–100)

## 2021-11-13 LAB — HIV ANTIBODY (ROUTINE TESTING W REFLEX): HIV Screen 4th Generation wRfx: NONREACTIVE

## 2021-11-13 SURGERY — OPEN REDUCTION INTERNAL FIXATION (ORIF) ANKLE FRACTURE
Anesthesia: General | Site: Ankle | Laterality: Right

## 2021-11-13 MED ORDER — FENTANYL CITRATE (PF) 250 MCG/5ML IJ SOLN
INTRAMUSCULAR | Status: AC
  Filled 2021-11-13: qty 5

## 2021-11-13 MED ORDER — BUPIVACAINE-EPINEPHRINE (PF) 0.5% -1:200000 IJ SOLN
INTRAMUSCULAR | Status: DC | PRN
  Administered 2021-11-13: 20 mL via PERINEURAL

## 2021-11-13 MED ORDER — ROCURONIUM BROMIDE 10 MG/ML (PF) SYRINGE
PREFILLED_SYRINGE | INTRAVENOUS | Status: AC
  Filled 2021-11-13: qty 10

## 2021-11-13 MED ORDER — FENTANYL CITRATE (PF) 100 MCG/2ML IJ SOLN: 50.0000 ug | Freq: Once | INTRAMUSCULAR | Status: AC

## 2021-11-13 MED ORDER — FENTANYL CITRATE (PF) 100 MCG/2ML IJ SOLN
INTRAMUSCULAR | Status: AC
  Administered 2021-11-13: 50 ug via INTRAVENOUS
  Filled 2021-11-13: qty 2

## 2021-11-13 MED ORDER — DOCUSATE SODIUM 100 MG PO CAPS
100.0000 mg | ORAL_CAPSULE | Freq: Two times a day (BID) | ORAL | Status: DC
  Administered 2021-11-13 – 2021-11-16 (×6): 100 mg via ORAL
  Filled 2021-11-13 (×6): qty 1

## 2021-11-13 MED ORDER — BACITRACIN ZINC 500 UNIT/GM EX OINT
TOPICAL_OINTMENT | CUTANEOUS | Status: AC
  Filled 2021-11-13: qty 28.35

## 2021-11-13 MED ORDER — MIDAZOLAM HCL 2 MG/2ML IJ SOLN
INTRAMUSCULAR | Status: AC
  Administered 2021-11-13: 1.5 mg via INTRAVENOUS
  Filled 2021-11-13: qty 2

## 2021-11-13 MED ORDER — CEFAZOLIN SODIUM-DEXTROSE 2-4 GM/100ML-% IV SOLN
2.0000 g | Freq: Three times a day (TID) | INTRAVENOUS | Status: AC
  Administered 2021-11-13 – 2021-11-14 (×3): 2 g via INTRAVENOUS
  Filled 2021-11-13 (×3): qty 100

## 2021-11-13 MED ORDER — ACETAMINOPHEN 500 MG PO TABS
1000.0000 mg | ORAL_TABLET | Freq: Four times a day (QID) | ORAL | Status: DC
  Administered 2021-11-13 – 2021-11-16 (×12): 1000 mg via ORAL
  Filled 2021-11-13 (×13): qty 2

## 2021-11-13 MED ORDER — PHENYLEPHRINE 40 MCG/ML (10ML) SYRINGE FOR IV PUSH (FOR BLOOD PRESSURE SUPPORT)
PREFILLED_SYRINGE | INTRAVENOUS | Status: AC
  Filled 2021-11-13: qty 10

## 2021-11-13 MED ORDER — ROCURONIUM BROMIDE 10 MG/ML (PF) SYRINGE
PREFILLED_SYRINGE | INTRAVENOUS | Status: DC | PRN
  Administered 2021-11-13: 50 mg via INTRAVENOUS
  Administered 2021-11-13: 20 mg via INTRAVENOUS

## 2021-11-13 MED ORDER — DIPHENHYDRAMINE HCL 25 MG PO CAPS
25.0000 mg | ORAL_CAPSULE | Freq: Four times a day (QID) | ORAL | Status: DC | PRN
  Administered 2021-11-13: 50 mg via ORAL
  Filled 2021-11-13: qty 2

## 2021-11-13 MED ORDER — INSULIN ASPART 100 UNIT/ML IJ SOLN
0.0000 [IU] | Freq: Three times a day (TID) | INTRAMUSCULAR | Status: DC
  Administered 2021-11-13: 8 [IU] via SUBCUTANEOUS
  Administered 2021-11-14 (×2): 2 [IU] via SUBCUTANEOUS
  Administered 2021-11-14 – 2021-11-15 (×2): 3 [IU] via SUBCUTANEOUS
  Administered 2021-11-15: 2 [IU] via SUBCUTANEOUS
  Administered 2021-11-16: 3 [IU] via SUBCUTANEOUS

## 2021-11-13 MED ORDER — ACETAMINOPHEN 500 MG PO TABS: 1000.0000 mg | ORAL_TABLET | Freq: Once | ORAL | Status: AC

## 2021-11-13 MED ORDER — PHENYLEPHRINE HCL-NACL 20-0.9 MG/250ML-% IV SOLN
INTRAVENOUS | Status: DC | PRN
  Administered 2021-11-13: 40 ug/min via INTRAVENOUS

## 2021-11-13 MED ORDER — MIDAZOLAM HCL 2 MG/2ML IJ SOLN: 1.5000 mg | Freq: Once | INTRAMUSCULAR | Status: AC

## 2021-11-13 MED ORDER — VANCOMYCIN HCL 1000 MG IV SOLR
INTRAVENOUS | Status: DC | PRN
  Administered 2021-11-13: 1000 mg

## 2021-11-13 MED ORDER — CHLORHEXIDINE GLUCONATE 4 % EX LIQD: 60.0000 mL | Freq: Once | CUTANEOUS | Status: DC

## 2021-11-13 MED ORDER — FENTANYL CITRATE (PF) 250 MCG/5ML IJ SOLN
INTRAMUSCULAR | Status: DC | PRN
  Administered 2021-11-13: 50 ug via INTRAVENOUS
  Administered 2021-11-13: 100 ug via INTRAVENOUS
  Administered 2021-11-13 (×2): 50 ug via INTRAVENOUS

## 2021-11-13 MED ORDER — LIDOCAINE 2% (20 MG/ML) 5 ML SYRINGE
INTRAMUSCULAR | Status: DC | PRN
  Administered 2021-11-13: 60 mg via INTRAVENOUS

## 2021-11-13 MED ORDER — DEXAMETHASONE SODIUM PHOSPHATE 10 MG/ML IJ SOLN
INTRAMUSCULAR | Status: DC | PRN
  Administered 2021-11-13: 10 mg via INTRAVENOUS

## 2021-11-13 MED ORDER — KETOROLAC TROMETHAMINE 15 MG/ML IJ SOLN
15.0000 mg | Freq: Four times a day (QID) | INTRAMUSCULAR | Status: AC
  Administered 2021-11-13 – 2021-11-14 (×4): 15 mg via INTRAVENOUS
  Filled 2021-11-13 (×4): qty 1

## 2021-11-13 MED ORDER — METOCLOPRAMIDE HCL 5 MG PO TABS: 5.0000 mg | ORAL_TABLET | Freq: Three times a day (TID) | ORAL | Status: DC | PRN

## 2021-11-13 MED ORDER — VANCOMYCIN HCL 1000 MG IV SOLR
INTRAVENOUS | Status: AC
  Filled 2021-11-13: qty 20

## 2021-11-13 MED ORDER — PROPOFOL 10 MG/ML IV BOLUS
INTRAVENOUS | Status: AC
  Filled 2021-11-13: qty 20

## 2021-11-13 MED ORDER — PHENYLEPHRINE 40 MCG/ML (10ML) SYRINGE FOR IV PUSH (FOR BLOOD PRESSURE SUPPORT)
PREFILLED_SYRINGE | INTRAVENOUS | Status: DC | PRN
  Administered 2021-11-13: 120 ug via INTRAVENOUS

## 2021-11-13 MED ORDER — 0.9 % SODIUM CHLORIDE (POUR BTL) OPTIME
TOPICAL | Status: DC | PRN
  Administered 2021-11-13: 1000 mL

## 2021-11-13 MED ORDER — CHLORHEXIDINE GLUCONATE 0.12 % MT SOLN: 15.0000 mL | Freq: Once | OROMUCOSAL | Status: AC

## 2021-11-13 MED ORDER — ONDANSETRON HCL 4 MG/2ML IJ SOLN
INTRAMUSCULAR | Status: AC
  Filled 2021-11-13: qty 2

## 2021-11-13 MED ORDER — CHLORHEXIDINE GLUCONATE 0.12 % MT SOLN
OROMUCOSAL | Status: AC
  Administered 2021-11-13: 15 mL via OROMUCOSAL
  Filled 2021-11-13: qty 15

## 2021-11-13 MED ORDER — ENOXAPARIN SODIUM 40 MG/0.4ML IJ SOSY
40.0000 mg | PREFILLED_SYRINGE | INTRAMUSCULAR | Status: DC
  Administered 2021-11-14 – 2021-11-16 (×3): 40 mg via SUBCUTANEOUS
  Filled 2021-11-13 (×3): qty 0.4

## 2021-11-13 MED ORDER — DEXAMETHASONE SODIUM PHOSPHATE 10 MG/ML IJ SOLN
INTRAMUSCULAR | Status: AC
  Filled 2021-11-13: qty 1

## 2021-11-13 MED ORDER — ACETAMINOPHEN 500 MG PO TABS
ORAL_TABLET | ORAL | Status: AC
  Administered 2021-11-13: 1000 mg via ORAL
  Filled 2021-11-13: qty 2

## 2021-11-13 MED ORDER — LACTATED RINGERS IV SOLN: INTRAVENOUS | Status: DC

## 2021-11-13 MED ORDER — CEFAZOLIN SODIUM-DEXTROSE 2-4 GM/100ML-% IV SOLN
2.0000 g | INTRAVENOUS | Status: AC
  Administered 2021-11-13: 2 g via INTRAVENOUS
  Filled 2021-11-13: qty 100

## 2021-11-13 MED ORDER — PROPOFOL 10 MG/ML IV BOLUS
INTRAVENOUS | Status: DC | PRN
  Administered 2021-11-13: 150 mg via INTRAVENOUS
  Administered 2021-11-13: 50 mg via INTRAVENOUS

## 2021-11-13 MED ORDER — ORAL CARE MOUTH RINSE: 15.0000 mL | Freq: Once | OROMUCOSAL | Status: AC

## 2021-11-13 MED ORDER — ONDANSETRON HCL 4 MG/2ML IJ SOLN
INTRAMUSCULAR | Status: DC | PRN
  Administered 2021-11-13: 4 mg via INTRAVENOUS

## 2021-11-13 MED ORDER — LIDOCAINE 2% (20 MG/ML) 5 ML SYRINGE
INTRAMUSCULAR | Status: AC
  Filled 2021-11-13: qty 5

## 2021-11-13 MED ORDER — METOCLOPRAMIDE HCL 5 MG/ML IJ SOLN: 5.0000 mg | Freq: Three times a day (TID) | INTRAMUSCULAR | Status: DC | PRN

## 2021-11-13 MED ORDER — HYDRALAZINE HCL 10 MG PO TABS
10.0000 mg | ORAL_TABLET | Freq: Four times a day (QID) | ORAL | Status: DC | PRN
Start: 2021-11-13 — End: 2021-11-16

## 2021-11-13 MED ORDER — POLYETHYLENE GLYCOL 3350 17 G PO PACK: 17.0000 g | PACK | Freq: Every day | ORAL | Status: DC | PRN

## 2021-11-13 MED ORDER — SODIUM CHLORIDE 0.9 % IV SOLN: INTRAVENOUS | Status: DC

## 2021-11-13 MED ORDER — SUGAMMADEX SODIUM 200 MG/2ML IV SOLN
INTRAVENOUS | Status: DC | PRN
  Administered 2021-11-13: 200 mg via INTRAVENOUS

## 2021-11-13 MED ORDER — POVIDONE-IODINE 10 % EX SWAB
2.0000 "application " | Freq: Once | CUTANEOUS | Status: AC
  Administered 2021-11-13: 2 via TOPICAL

## 2021-11-13 SURGICAL SUPPLY — 93 items
BAG COUNTER SPONGE SURGICOUNT (BAG) ×3 IMPLANT
BANDAGE ESMARK 6X9 LF (GAUZE/BANDAGES/DRESSINGS) ×2 IMPLANT
BIT DRILL CLAV ALPS 2.7X145 (BIT) ×1 IMPLANT
BIT DRILL QC 2.0 SHORT EVOS SM (DRILL) IMPLANT
BIT DRILL QC 2.5MM SHRT EVO SM (DRILL) IMPLANT
BNDG COHESIVE 4X5 TAN STRL (GAUZE/BANDAGES/DRESSINGS) ×3 IMPLANT
BNDG ELASTIC 4X5.8 VLCR STR LF (GAUZE/BANDAGES/DRESSINGS) ×1 IMPLANT
BNDG ELASTIC 6X15 VLCR STRL LF (GAUZE/BANDAGES/DRESSINGS) ×1 IMPLANT
BNDG ELASTIC 6X5.8 VLCR STR LF (GAUZE/BANDAGES/DRESSINGS) IMPLANT
BNDG ESMARK 6X9 LF (GAUZE/BANDAGES/DRESSINGS) ×3
BRUSH SCRUB EZ PLAIN DRY (MISCELLANEOUS) ×6 IMPLANT
CHLORAPREP W/TINT 26 (MISCELLANEOUS) ×3 IMPLANT
COVER SURGICAL LIGHT HANDLE (MISCELLANEOUS) ×6 IMPLANT
DERMABOND ADHESIVE PROPEN (GAUZE/BANDAGES/DRESSINGS) ×1
DERMABOND ADVANCED (GAUZE/BANDAGES/DRESSINGS) ×2
DERMABOND ADVANCED .7 DNX12 (GAUZE/BANDAGES/DRESSINGS) ×4 IMPLANT
DERMABOND ADVANCED .7 DNX6 (GAUZE/BANDAGES/DRESSINGS) IMPLANT
DRAPE C-ARM 42X72 X-RAY (DRAPES) ×4 IMPLANT
DRAPE C-ARMOR (DRAPES) ×4 IMPLANT
DRAPE INCISE IOBAN 66X45 STRL (DRAPES) ×4 IMPLANT
DRAPE ORTHO SPLIT 77X108 STRL (DRAPES) ×9
DRAPE SURG ORHT 6 SPLT 77X108 (DRAPES) ×4 IMPLANT
DRAPE U-SHAPE 47X51 STRL (DRAPES) ×6 IMPLANT
DRILL QC 2.0 SHORT EVOS SM (DRILL) ×3
DRILL QC 2.5MM SHORT EVOS SM (DRILL) ×3
DRSG ADAPTIC 3X8 NADH LF (GAUZE/BANDAGES/DRESSINGS) IMPLANT
DRSG MEPILEX BORDER 4X8 (GAUZE/BANDAGES/DRESSINGS) ×3 IMPLANT
DRSG MEPITEL 4X7.2 (GAUZE/BANDAGES/DRESSINGS) ×1 IMPLANT
DRSG PAD ABDOMINAL 8X10 ST (GAUZE/BANDAGES/DRESSINGS) ×1 IMPLANT
ELECT REM PT RETURN 9FT ADLT (ELECTROSURGICAL) ×3
ELECTRODE REM PT RTRN 9FT ADLT (ELECTROSURGICAL) ×2 IMPLANT
GAUZE SPONGE 4X4 12PLY STRL (GAUZE/BANDAGES/DRESSINGS) ×1 IMPLANT
GAUZE SPONGE 4X4 16PLY XRAY LF (GAUZE/BANDAGES/DRESSINGS) ×1 IMPLANT
GLOVE SURG ENC MOIS LTX SZ6.5 (GLOVE) ×9 IMPLANT
GLOVE SURG ENC MOIS LTX SZ7.5 (GLOVE) ×9 IMPLANT
GLOVE SURG UNDER POLY LF SZ6.5 (GLOVE) ×3 IMPLANT
GLOVE SURG UNDER POLY LF SZ7.5 (GLOVE) ×3 IMPLANT
GOWN STRL REUS W/ TWL LRG LVL3 (GOWN DISPOSABLE) ×4 IMPLANT
GOWN STRL REUS W/TWL LRG LVL3 (GOWN DISPOSABLE) ×15
K-WIRE 1.6 (WIRE) ×6
K-WIRE FX150X1.6XTROC PNT (WIRE) ×4
K-WIRE TROCHAR TIP ALPS 1.6 (WIRE) ×6
KIT BASIN OR (CUSTOM PROCEDURE TRAY) ×3 IMPLANT
KIT TURNOVER KIT B (KITS) ×3 IMPLANT
KWIRE FX150X1.6XTROC PNT (WIRE) IMPLANT
KWIRE TROCHAR TIP ALPS 1.6 (WIRE) IMPLANT
MANIFOLD NEPTUNE II (INSTRUMENTS) ×2 IMPLANT
NDL HYPO 21X1.5 SAFETY (NEEDLE) IMPLANT
NDL HYPO 25GX1X1/2 BEV (NEEDLE) ×2 IMPLANT
NEEDLE HYPO 21X1.5 SAFETY (NEEDLE) IMPLANT
NEEDLE HYPO 25GX1X1/2 BEV (NEEDLE) ×3 IMPLANT
NS IRRIG 1000ML POUR BTL (IV SOLUTION) ×3 IMPLANT
PACK GENERAL/GYN (CUSTOM PROCEDURE TRAY) ×3 IMPLANT
PACK TOTAL JOINT (CUSTOM PROCEDURE TRAY) ×3 IMPLANT
PAD ARMBOARD 7.5X6 YLW CONV (MISCELLANEOUS) ×6 IMPLANT
PAD CAST 4YDX4 CTTN HI CHSV (CAST SUPPLIES) IMPLANT
PADDING CAST COTTON 4X4 STRL (CAST SUPPLIES)
PADDING CAST COTTON 6X4 STRL (CAST SUPPLIES) IMPLANT
PLATE CLAV SUP LT 10H 110MM NS (Plate) ×1 IMPLANT
PLATE FIB EVOS 2.7/3.5 7H R103 (Plate) ×1 IMPLANT
SCREW CORT 2.7X15 T8 ST EVOS (Screw) ×1 IMPLANT
SCREW CORT 2.7X16 STAR T8 EVOS (Screw) ×2 IMPLANT
SCREW CORT 3.5X11 ST EVOS (Screw) ×2 IMPLANT
SCREW CORT 3.5X44MM ST EVOS (Screw) ×1 IMPLANT
SCREW CORT EVOS ST 3.5X12 (Screw) ×1 IMPLANT
SCREW CORT LP 3.5X12 (Screw) ×6 IMPLANT
SCREW CORT LP 3.5X14 (Screw) ×2 IMPLANT
SCREW CORT ST EVOS 3.5X60 (Screw) ×1 IMPLANT
SCREW CORT ST EVOS 3.5X65 (Screw) ×1 IMPLANT
SCREW CTX 3.5X48MM EVOS (Screw) ×1 IMPLANT
SCREW CTX 3.5X50MM EVOS (Screw) ×1 IMPLANT
SCREW EVOS 2.7X18 LOCK T8 (Screw) ×1 IMPLANT
SLING ARM FOAM STRAP LRG (SOFTGOODS) ×1 IMPLANT
SLING ARM IMMOBILIZER LRG (SOFTGOODS) IMPLANT
SLING ARM IMMOBILIZER MED (SOFTGOODS) IMPLANT
SPONGE T-LAP 18X18 ~~LOC~~+RFID (SPONGE) ×3 IMPLANT
STAPLER VISISTAT 35W (STAPLE) ×3 IMPLANT
STOCKINETTE IMPERVIOUS 9X36 MD (GAUZE/BANDAGES/DRESSINGS) IMPLANT
SUCTION FRAZIER HANDLE 10FR (MISCELLANEOUS) ×6
SUCTION TUBE FRAZIER 10FR DISP (MISCELLANEOUS) ×2 IMPLANT
SUT ETHILON 3 0 PS 1 (SUTURE) ×8 IMPLANT
SUT MNCRL AB 3-0 PS2 27 (SUTURE) ×3 IMPLANT
SUT PROLENE 0 CT (SUTURE) IMPLANT
SUT VIC AB 0 CT1 27 (SUTURE) ×3
SUT VIC AB 0 CT1 27XBRD ANBCTR (SUTURE) ×2 IMPLANT
SUT VIC AB 2-0 CT1 27 (SUTURE) ×6
SUT VIC AB 2-0 CT1 TAPERPNT 27 (SUTURE) ×4 IMPLANT
SYR CONTROL 10ML LL (SYRINGE) ×3 IMPLANT
TOWEL GREEN STERILE (TOWEL DISPOSABLE) ×7 IMPLANT
TOWEL GREEN STERILE FF (TOWEL DISPOSABLE) ×3 IMPLANT
TUBING BULK SUCTION (MISCELLANEOUS) ×1 IMPLANT
UNDERPAD 30X36 HEAVY ABSORB (UNDERPADS AND DIAPERS) ×3 IMPLANT
WATER STERILE IRR 1000ML POUR (IV SOLUTION) ×3 IMPLANT
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
## 2021-11-13 NOTE — Anesthesia Procedure Notes (Signed)
Procedure Name: Intubation ?Date/Time: 11/13/2021 10:13 AM ?Performed by: Erick Colace, CRNA ?Pre-anesthesia Checklist: Patient identified, Emergency Drugs available, Suction available and Patient being monitored ?Patient Re-evaluated:Patient Re-evaluated prior to induction ?Oxygen Delivery Method: Circle system utilized ?Preoxygenation: Pre-oxygenation with 100% oxygen ?Induction Type: IV induction and Cricoid Pressure applied ?Ventilation: Mask ventilation without difficulty ?Laryngoscope Size: Sabra Heck and 3 ?Grade View: Grade I ?Tube type: Oral ?Number of attempts: 2 ?Airway Equipment and Method: Stylet and Oral airway ?Placement Confirmation: ETT inserted through vocal cords under direct vision, positive ETCO2 and breath sounds checked- equal and bilateral ?Secured at: 22 cm ?Tube secured with: Tape ?Dental Injury: Teeth and Oropharynx as per pre-operative assessment  ?Comments: First DL attempt with MAC 4 unsuccessful by CRNA - grade 3 view. Second attempt by MD successful with Sabra Heck 3 and cricoid pressure. ? ? ? ? ?

## 2021-11-13 NOTE — Plan of Care (Signed)

## 2021-11-13 NOTE — Anesthesia Postprocedure Evaluation (Signed)
Anesthesia Post Note ? ?Patient: Christian Vance ? ?Procedure(s) Performed: OPEN REDUCTION INTERNAL FIXATION (ORIF) ANKLE FRACTURE (Right: Ankle) ?OPEN REDUCTION INTERNAL FIXATION (ORIF) CLAVICULAR FRACTURE (Left) ? ?  ? ?Patient location during evaluation: PACU ?Anesthesia Type: General and Regional ?Level of consciousness: awake and alert, oriented and patient cooperative ?Pain management: pain level controlled ?Vital Signs Assessment: post-procedure vital signs reviewed and stable ?Respiratory status: spontaneous breathing, nonlabored ventilation and respiratory function stable ?Cardiovascular status: blood pressure returned to baseline and stable ?Postop Assessment: no apparent nausea or vomiting ?Anesthetic complications: no ? ? ?No notable events documented. ? ?Last Vitals:  ?Vitals:  ? 11/13/21 0955 11/13/21 1245  ?BP: 102/63 (!) 141/94  ?Pulse: (!) 106 (!) 108  ?Resp: 16 12  ?Temp:  37 ?C  ?SpO2: 97% 92%  ?  ?Last Pain:  ?Vitals:  ? 11/13/21 1245  ?TempSrc:   ?PainSc: 0-No pain  ? ? ?  ?  ?  ?  ?  ?  ? ?[name] ? ? ? ? ?

## 2021-11-13 NOTE — Interval H&P Note (Signed)
History and Physical Interval Note: ? ?11/13/2021 ?9:32 AM ? ?Ether Griffins  has presented today for surgery, with the diagnosis of ankle and  left clavicle fracture.  The various methods of treatment have been discussed with the patient and family. After consideration of risks, benefits and other options for treatment, the patient has consented to  Procedure(s): ?OPEN REDUCTION INTERNAL FIXATION (ORIF) ANKLE FRACTURE (Right) ?OPEN REDUCTION INTERNAL FIXATION (ORIF) CLAVICULAR FRACTURE (Left) as a surgical intervention.  The patient's history has been reviewed, patient examined, no change in status, stable for surgery.  I have reviewed the patient's chart and labs.  Questions were answered to the patient's satisfaction.   ? ? ?Lennette Bihari P Averey Koning ? ? ?

## 2021-11-13 NOTE — Anesthesia Procedure Notes (Addendum)
Anesthesia Regional Block: Popliteal block  ? ?Pre-Anesthetic Checklist: , timeout performed,  Correct Patient, Correct Site, Correct Laterality,  Correct Procedure, Correct Position, site marked,  Risks and benefits discussed,  Surgical consent,  At surgeon's request and post-op pain management ? ?Laterality: Right ? ?Prep: chloraprep     ?  ?Needles:  ?Injection technique: Single-shot ? ?Needle Type: Stimulator Needle - 80   ? ? ? ? ? ? ? ?Additional Needles: ? ? ?Procedures: Doppler guided,, transarterial technique, paresthesia technique, ultrasound used (permanent image in chart),, Esmarch exsanguination    ? ?Nerve Stimulator or Paresthesia:  ?Response: 0.5 mA ? ?Additional Responses:  ? ?Narrative:  ?Start time: 11/13/2021 9:35 AM ?End time: 11/13/2021 9:50 AM ?Injection made incrementally with aspirations every 5 mL. ? ?Performed by: Personally  ?Anesthesiologist: Belinda Block, MD ? ? ? ? ?

## 2021-11-13 NOTE — Transfer of Care (Signed)
Immediate Anesthesia Transfer of Care Note ? ?Patient: Christian Vance ? ?Procedure(s) Performed: OPEN REDUCTION INTERNAL FIXATION (ORIF) ANKLE FRACTURE (Right: Ankle) ?OPEN REDUCTION INTERNAL FIXATION (ORIF) CLAVICULAR FRACTURE (Left) ? ?Patient Location: PACU ? ?Anesthesia Type:General ? ?Level of Consciousness: drowsy ? ?Airway & Oxygen Therapy: Patient Spontanous Breathing ? ?Post-op Assessment: Report given to RN and Post -op Vital signs reviewed and stable ? ?Post vital signs: Reviewed and stable ? ?Last Vitals:  ?Vitals Value Taken Time  ?BP 141/94 11/13/21 1243  ?Temp    ?Pulse 108 11/13/21 1244  ?Resp 12 11/13/21 1244  ?SpO2 95 % 11/13/21 1244  ?Vitals shown include unvalidated device data. ? ?Last Pain:  ?Vitals:  ? 11/13/21 0922  ?TempSrc:   ?PainSc: 4   ?   ? ?Patients Stated Pain Goal: 1 (11/13/21 8648) ? ?Complications: No notable events documented. ?

## 2021-11-13 NOTE — Plan of Care (Signed)

## 2021-11-13 NOTE — Progress Notes (Signed)
PT Cancellation Note ? ?Patient Details ?Name: LAWRANCE WIEDEMANN ?MRN: [id number] ?DOB: Oct 13, 1960 ? ? ?Cancelled Treatment:    Reason Eval/Treat Not Completed: Patient at procedure or test/unavailable, pt off unit in surgery. Will check back as schedule allows to continue with PT POC. ? ? ? ?Betsey Holiday Shatoria [place] ?11/13/2021, 11:00 AM ? ? ?

## 2021-11-13 NOTE — Anesthesia Preprocedure Evaluation (Signed)
Anesthesia Evaluation  ?Patient identified by MRN, date of birth, ID band ?Patient awake ? ? ? ?Reviewed: ?Allergy & Precautions, NPO status , Patient's Chart, lab work & pertinent test results ? ?Airway ?Mallampati: II ? ?TM Distance: >3 FB ? ? ? ? Dental ?  ?Pulmonary ?neg pulmonary ROS,  ?  ?breath sounds clear to auscultation ? ? ? ? ? ? Cardiovascular ?negative cardio ROS ? ? ?Rhythm:Regular Rate:Normal ? ? ?  ?Neuro/Psych ?negative neurological ROS ?   ? GI/Hepatic ?negative GI ROS, (+) Hepatitis -  ?Endo/Other  ?diabetes ? Renal/GU ?  ? ?  ?Musculoskeletal ? ? Abdominal ?  ?Peds ? Hematology ?  ?Anesthesia Other Findings ? ? Reproductive/Obstetrics ? ?  ? ? ? ? ? ? ? ? ? ? ? ? ? ?  ?  ? ? ? ? ? ? ? ? ?Anesthesia Physical ?Anesthesia Plan ? ?ASA: 3 ? ?Anesthesia Plan: General  ? ?Post-op Pain Management: Regional block*  ? ?Induction: Intravenous ? ?PONV Risk Score and Plan: 2 and Ondansetron, Dexamethasone and Midazolam ? ?Airway Management Planned: Oral ETT ? ?Additional Equipment:  ? ?Intra-op Plan:  ? ?Post-operative Plan: Extubation in OR ? ?Informed Consent: I have reviewed the patients History and Physical, chart, labs and discussed the procedure including the risks, benefits and alternatives for the proposed anesthesia with the patient or authorized representative who has indicated his/her understanding and acceptance.  ? ? ? ?Dental advisory given ? ?Plan Discussed with: CRNA and Anesthesiologist ? ?Anesthesia Plan Comments:   ? ? ? ? ? ? ?Anesthesia Quick Evaluation ? ?

## 2021-11-13 NOTE — Discharge Instructions (Signed)
? ?Orthopaedic Trauma Service Discharge Instructions ? ? ?General Discharge Instructions ? ?WEIGHT BEARING STATUS: ?Right leg - Non-weightbearing ?Left arm - Weightbearing as tolerated ? ?RANGE OF MOTION/ACTIVITY ?Right leg - DO NOT remove splint. Ok for knee motion as tolerated ?Left arm - Ok for shoulder and elbow motion as tolerated. Use sling as needed for comfort ? ?Wound Care: ?Right leg - DO NOT remove splint. Cover splint to keep it dry when showering ?Left arm - You may remove your surgical dressing on Sunday 11/15/21. Incisions can be left open to air if there is no drainage. Okay to shower if no drainage from incisions. ? ?DVT/PE prophylaxis: Aspirin ? ?Diet: as you were eating previously.  Can use over the counter stool softeners and bowel preparations, such as Miralax, to help with bowel movements.  Narcotics can be constipating.  Be sure to drink plenty of fluids ? ?PAIN MEDICATION USE AND EXPECTATIONS ? You have likely been given narcotic medications to help control your pain.  After a traumatic event that results in an fracture (broken bone) with or without surgery, it is ok to use narcotic pain medications to help control one's pain.  We understand that everyone responds to pain differently and each individual patient will be evaluated on a regular basis for the continued need for narcotic medications. Ideally, narcotic medication use should last no more than 6-8 weeks (coinciding with fracture healing).  ? As a patient it is your responsibility as well to monitor narcotic medication use and report the amount and frequency you use these medications when you come to your office visit.  ? We would also advise that if you are using narcotic medications, you should take a dose prior to therapy to maximize you participation. ? ?IF YOU ARE ON NARCOTIC MEDICATIONS IT IS NOT PERMISSIBLE TO OPERATE A MOTOR VEHICLE (MOTORCYCLE/CAR/TRUCK/MOPED) OR HEAVY MACHINERY ?DO NOT MIX NARCOTICS WITH OTHER CNS ([place]) DEPRESSANTS SUCH AS ALCOHOL ? ? ?STOP SMOKING OR USING NICOTINE PRODUCTS!!!! ? As discussed nicotine severely impairs your body's ability to heal surgical and traumatic wounds but also impairs bone healing.  Wounds and bone heal by forming microscopic blood vessels (angiogenesis) and nicotine is a vasoconstrictor (essentially, shrinks blood vessels).  Therefore, if vasoconstriction occurs to these microscopic blood vessels they essentially disappear and are unable to deliver necessary nutrients to the healing tissue.  This is one modifiable factor that you can do to dramatically increase your chances of healing your injury.   ? (This means no smoking, no nicotine gum, patches, etc) ? ?DO NOT USE NONSTEROIDAL ANTI-INFLAMMATORY DRUGS (NSAID'S) ? Using products such as Advil (ibuprofen), Aleve (naproxen), Motrin (ibuprofen) for additional pain control during fracture healing can delay and/or prevent the healing response.  If you would like to take over the counter (OTC) medication, Tylenol (acetaminophen) is ok.  However, some narcotic medications that are given for pain control contain acetaminophen as well. Therefore, you should not exceed more than 4000 mg of tylenol in a day if you do not have liver disease.  Also note that there are may OTC medicines, such as cold medicines and allergy medicines that my contain tylenol as well.  If you have any questions about medications and/or interactions please ask your doctor/PA or your pharmacist.  ?   ? ?ICE AND ELEVATE INJURED/OPERATIVE EXTREMITY ? Using ice and elevating the injured extremity above your heart can help with swelling and pain control.  Icing in a pulsatile fashion, such as 20 minutes  on and 20 minutes off, can be followed.   ? Do not place ice directly on skin. Make sure there is a barrier between to skin and the ice pack.   ? Using frozen items such as frozen peas works well as the conform nicely to the are that needs to be iced. ? ?USE AN  ACE WRAP OR TED HOSE FOR SWELLING CONTROL ? In addition to icing and elevation, Ace wraps or TED hose are used to help limit and resolve swelling.  It is recommended to use Ace wraps or TED hose until you are informed to stop.   ? When using Ace Wraps start the wrapping distally (farthest away from the body) and wrap proximally (closer to the body) ?  Example: If you had surgery on your leg or thing and you do not have a splint on, start the ace wrap at the toes and work your way up to the thigh ?       If you had surgery on your upper extremity and do not have a splint on, start the ace wrap at your fingers and work your way up to the upper arm ? ?IF YOU ARE IN A SPLINT OR CAST DO NOT REMOVE IT FOR ANY REASON  ? If your splint gets wet for any reason please contact the office immediately. You may shower in your splint or cast as long as you keep it dry.  This can be done by wrapping in a cast cover or garbage back (or similar) ? Do Not stick any thing down your splint or cast such as pencils, money, or hangers to try and scratch yourself with.  If you feel itchy take benadryl as prescribed on the bottle for itching ? ? ?CALL THE OFFICE WITH ANY QUESTIONS OR CONCERNS: [phone]  ? ?VISIT OUR WEBSITE FOR ADDITIONAL INFORMATION: [email or web address] ?  ? ? ?Discharge Wound Care Instructions ? ?Do NOT apply any ointments, solutions or lotions to pin sites or surgical wounds.  These prevent needed drainage and even though solutions like hydrogen peroxide kill bacteria, they also damage cells lining the pin sites that help fight infection.  Applying lotions or ointments can keep the wounds moist and can cause them to breakdown and open up as well. This can increase the risk for infection. When in doubt call the office. ? ?Once the incision is completely dry and without drainage, it may be left open to air out.  Showering may begin 36-48 hours later.  Cleaning gently with soap and water. ? ? ?
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
## 2021-11-14 LAB — CBC
HCT: 35.8 % — ABNORMAL LOW (ref 39.0–52.0)
Hemoglobin: 12.1 g/dL — ABNORMAL LOW (ref 13.0–17.0)
MCH: 33 pg (ref 26.0–34.0)
MCHC: 33.8 g/dL (ref 30.0–36.0)
MCV: 97.5 fL (ref 80.0–100.0)
Platelets: 151 10*3/uL (ref 150–400)
RBC: 3.67 MIL/uL — ABNORMAL LOW (ref 4.22–5.81)
RDW: 12.8 % (ref 11.5–15.5)
WBC: 8.4 10*3/uL (ref 4.0–10.5)
nRBC: 0 % (ref 0.0–0.2)

## 2021-11-14 LAB — BASIC METABOLIC PANEL
Anion gap: 7 (ref 5–15)
BUN: 11 mg/dL (ref 8–23)
CO2: 27 mmol/L (ref 22–32)
Calcium: 9 mg/dL (ref 8.9–10.3)
Chloride: 105 mmol/L (ref 98–111)
Creatinine, Ser: 0.84 mg/dL (ref 0.61–1.24)
GFR, Estimated: >60 mL/min (ref >60)
Glucose, Bld: 189 mg/dL — ABNORMAL HIGH (ref 70–99)
Potassium: 4.4 mmol/L (ref 3.5–5.1)
Sodium: 139 mmol/L (ref 135–145)

## 2021-11-14 LAB — GLUCOSE, CAPILLARY
Glucose-Capillary: 162 mg/dL — ABNORMAL HIGH (ref 70–99)
Glucose-Capillary: 129 mg/dL — ABNORMAL HIGH (ref 70–99)
Glucose-Capillary: 132 mg/dL — ABNORMAL HIGH (ref 70–99)
Glucose-Capillary: 151 mg/dL — ABNORMAL HIGH (ref 70–99)

## 2021-11-14 NOTE — Progress Notes (Signed)
? ?  ORTHOPAEDIC PROGRESS NOTE ? ?s/p Procedure(s): ?OPEN REDUCTION INTERNAL FIXATION (ORIF) ANKLE FRACTURE ?OPEN REDUCTION INTERNAL FIXATION (ORIF) CLAVICULAR FRACTURE ? ?SUBJECTIVE: ?Reports mild pain about operative site. Able to feel his fingers more. Right ankle nerve block still working. No chest pain. No SOB. No nausea/vomiting. No other complaints. ? ?OBJECTIVE: ?PE: ?General: resting in hospital bed ?LUE: Dressing CDI and sling well fitting,  full and painless ROM throughout hand with DPC of 0. + Motor in  AIN, PIN, Ulnar distributions. Sensation intact distally. Well perfused digits.  ?RLE: splint CDI, unable to test motor function and sensation due as nerve block is still in effect. warm well perfused foot, no pain w passive stretch ? ? ?Vitals:  ? 11/13/21 1918 11/13/21 2123  ?BP: 118/86 124/77  ?Pulse: (!) 102 93  ?Resp: 18 16  ?Temp: 98 ?F (36.7 ?C) 98.5 ?F (36.9 ?C)  ?SpO2: 100% 98%  ? ? ? ?ASSESSMENT: ?SAKSHAM AKKERMAN is a 61 y.o. male doing well postoperatively. POD#1 ? ?PLAN: ?Weightbearing:  ? - LUE: WBAT ? - RLE: NWB ?Insicional and dressing care:  ? - LUE: reinforce as needed ? - RLE: keep splint clean and dry ?Orthopedic device(s):   ? - LUE: sling ? - RLE: splint ?Showering: Hold for now ?VTE prophylaxis: Lovenox ?Pain control: PRN pain medication, minimize narcotics as able ?Follow - up plan: 1-2 weeks in office ?Dispo: TBD. PT/OT evals today ?Contact information:  After hours and holidays please check [email or web address] for group call information for [facility] ? ?Noemi Chapel, PA-C ?11/14/2021 ? ? ?
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
## 2021-11-14 NOTE — Plan of Care (Signed)

## 2021-11-15 ENCOUNTER — Encounter (HOSPITAL_COMMUNITY): Payer: Self-pay | Admitting: Student

## 2021-11-15 LAB — BASIC METABOLIC PANEL
Anion gap: 4 — ABNORMAL LOW (ref 5–15)
BUN: 13 mg/dL (ref 8–23)
CO2: 25 mmol/L (ref 22–32)
Calcium: 8.3 mg/dL — ABNORMAL LOW (ref 8.9–10.3)
Chloride: 110 mmol/L (ref 98–111)
Creatinine, Ser: 0.83 mg/dL (ref 0.61–1.24)
GFR, Estimated: >60 mL/min (ref >60)
Glucose, Bld: 118 mg/dL — ABNORMAL HIGH (ref 70–99)
Potassium: 3.8 mmol/L (ref 3.5–5.1)
Sodium: 139 mmol/L (ref 135–145)

## 2021-11-15 LAB — CBC
HCT: 31.3 % — ABNORMAL LOW (ref 39.0–52.0)
Hemoglobin: 10.4 g/dL — ABNORMAL LOW (ref 13.0–17.0)
MCH: 32.7 pg (ref 26.0–34.0)
MCHC: 33.2 g/dL (ref 30.0–36.0)
MCV: 98.4 fL (ref 80.0–100.0)
Platelets: 158 10*3/uL (ref 150–400)
RBC: 3.18 MIL/uL — ABNORMAL LOW (ref 4.22–5.81)
RDW: 13 % (ref 11.5–15.5)
WBC: 6.4 10*3/uL (ref 4.0–10.5)
nRBC: 0 % (ref 0.0–0.2)

## 2021-11-15 LAB — GLUCOSE, CAPILLARY
Glucose-Capillary: 194 mg/dL — ABNORMAL HIGH (ref 70–99)
Glucose-Capillary: 108 mg/dL — ABNORMAL HIGH (ref 70–99)
Glucose-Capillary: 141 mg/dL — ABNORMAL HIGH (ref 70–99)
Glucose-Capillary: 141 mg/dL — ABNORMAL HIGH (ref 70–99)

## 2021-11-15 NOTE — Progress Notes (Signed)
? ?  ORTHOPAEDIC PROGRESS NOTE ? ?s/p Procedure(s): ?OPEN REDUCTION INTERNAL FIXATION (ORIF) ANKLE FRACTURE ?OPEN REDUCTION INTERNAL FIXATION (ORIF) CLAVICULAR FRACTURE ? ?SUBJECTIVE: ?Working with OT this morning. Motivated to get better. Did well with therapy yesterday. No chest pain. No SOB. No nausea/vomiting. No other complaints. ? ?OBJECTIVE: ?PE: ?General: sitting up on the side of the hospital bed ?LUE: Dressing CDI,  full and painless ROM throughout hand with DPC of 0. + Motor in  AIN, PIN, Ulnar distributions. Sensation intact distally. Well perfused digits.  ?RLE: splint CDI, +EHL though remainder of motor difficult to test due to splint, sensation intact distally with warm well perfused foot, no pain w passive stretch ? ? ?Vitals:  ? 11/15/21 0425 11/15/21 0817  ?BP: 116/71 120/75  ?Pulse: 88 98  ?Resp: 16 20  ?Temp: 98.9 ?F (37.2 ?C) 98.2 ?F (36.8 ?C)  ?SpO2: 97% 95%  ? ? ? ?ASSESSMENT: ?Christian Vance is a 61 y.o. male doing well postoperatively. POD#2 ? ?PLAN: ?Weightbearing:  ? - LUE: WBAT ? - RLE: NWB ?Insicional and dressing care:  ? - LUE: reinforce as needed ? - RLE: keep splint clean and dry ?Orthopedic device(s):   ? - LUE: sling ? - RLE: splint ?Showering: Hold for now ?VTE prophylaxis: Lovenox ?ABLA: Hgb 10.4 today. Hemodynamically stable. Continue to monitor.  ?Pain control: PRN pain medication, minimize narcotics as able ?Follow - up plan: 1-2 weeks in office ?Dispo: TBD. PT/OT recommending SNF versus HHPT depending on how he does today ?Contact information:  After hours and holidays please check [email or web address] for group call information for [facility] ? ?Noemi Chapel, PA-C ?11/15/2021 ? ? ?
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
## 2021-11-15 NOTE — Plan of Care (Signed)
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
## 2021-11-16 ENCOUNTER — Other Ambulatory Visit (HOSPITAL_COMMUNITY): Payer: Self-pay

## 2021-11-16 LAB — BASIC METABOLIC PANEL
Anion gap: 7 (ref 5–15)
BUN: 11 mg/dL (ref 8–23)
CO2: 27 mmol/L (ref 22–32)
Calcium: 8.8 mg/dL — ABNORMAL LOW (ref 8.9–10.3)
Chloride: 103 mmol/L (ref 98–111)
Creatinine, Ser: 0.74 mg/dL (ref 0.61–1.24)
GFR, Estimated: >60 mL/min (ref >60)
Glucose, Bld: 197 mg/dL — ABNORMAL HIGH (ref 70–99)
Potassium: 4.3 mmol/L (ref 3.5–5.1)
Sodium: 137 mmol/L (ref 135–145)

## 2021-11-16 LAB — GLUCOSE, CAPILLARY
Glucose-Capillary: 103 mg/dL — ABNORMAL HIGH (ref 70–99)
Glucose-Capillary: 152 mg/dL — ABNORMAL HIGH (ref 70–99)

## 2021-11-16 MED ORDER — ASPIRIN 325 MG PO TBEC
325.0000 mg | DELAYED_RELEASE_TABLET | Freq: Two times a day (BID) | ORAL | 0 refills | Status: AC
  Filled 2021-11-16: qty 60, 30d supply, fill #0

## 2021-11-16 MED ORDER — METHOCARBAMOL 500 MG PO TABS
500.0000 mg | ORAL_TABLET | Freq: Four times a day (QID) | ORAL | 0 refills | Status: DC | PRN
  Filled 2021-11-16: qty 28, 7d supply, fill #0

## 2021-11-16 MED ORDER — OXYCODONE HCL 10 MG PO TABS
5.0000 mg | ORAL_TABLET | ORAL | 0 refills | Status: DC | PRN
  Filled 2021-11-16: qty 42, 7d supply, fill #0

## 2021-11-16 MED ORDER — POLYETHYLENE GLYCOL 3350 17 G PO PACK: 17.0000 g | PACK | Freq: Every day | ORAL | Status: DC | PRN
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
## 2021-11-16 NOTE — Plan of Care (Signed)
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
## 2021-11-16 NOTE — Progress Notes (Signed)
ORTHOPAEDIC PROGRESS NOTE ? ?s/p Procedure(s): ?ORIF RIGHT ANKLE FRACTURE ?ORIF LEFT CLAVICLEFRACTURE ? ?SUBJECTIVE: ?Did well with therapy yesterday, making good progress. No chest pain. No SOB. No nausea/vomiting. No other complaints. ? ?OBJECTIVE: ?PE: ?General: sitting up in hospital bed, NAD ?LUE: Dressing removed, incisions CDI,  full and painless ROM throughout hand with DPC of 0. + Motor in  AIN, PIN, Ulnar distributions. Sensation intact distally. Well perfused digits.  ?RLE: splint CDI, +EHL though remainder of motor difficult to test due to splint, sensation intact distally with warm well perfused foot, no pain w passive stretch ? ? ?Vitals:  ? 11/15/21 2014 11/16/21 0759  ?BP: 124/74 122/81  ?Pulse: 97 90  ?Resp: 18 17  ?Temp: 98.2 ?F (36.8 ?C) 99.9 ?F (37.7 ?C)  ?SpO2: 96% 96%  ? ? ? ?ASSESSMENT: ?Christian Vance is a 61 y.o. male doing well postoperatively. POD#3 ? ?PLAN: ?Weightbearing:  ? - LUE: WBAT ? - RLE: NWB. Ok for knee scooter ?Insicional and dressing care:  ? - LUE: reinforce as needed ? - RLE: keep splint clean and dry ?Orthopedic device(s):   ? - LUE: sling ? - RLE: splint ?Showering: LUE incision can get wet. Keep RLE splint dry ?VTE prophylaxis: Lovenox ?ABLA: Hgb 10.4 on 11/15/21. Hemodynamically stable. Continue to monitor.  ?Pain control: PRN pain medication, minimize narcotics as able ?Follow - up plan: 2 weeks with Dr. Doreatha Martin for splint and suture removal ?Dispo: PT/OT recommending HH PT. Plan for d/c later this afternoon if patient progresses well with therapist ? ?Contact information:  After hours and holidays please check [email or web address] for group call information for [facility] ? ? ?Gwinda Passe PA-C ?[facility] ?[phone] (office) ?[email or web address] ? ? ? ?

## 2021-12-09 ENCOUNTER — Encounter: Payer: Self-pay | Admitting: Internal Medicine

## 2021-12-09 ENCOUNTER — Ambulatory Visit (INDEPENDENT_AMBULATORY_CARE_PROVIDER_SITE_OTHER): Payer: 59 | Admitting: Internal Medicine

## 2021-12-09 ENCOUNTER — Ambulatory Visit: Payer: 59 | Admitting: Internal Medicine

## 2021-12-09 VITALS — BP 118/70 | HR 94 | Temp 97.9°F | Ht 66.0 in | Wt 176.6 lb

## 2021-12-09 DIAGNOSIS — Z09 Encounter for follow-up examination after completed treatment for conditions other than malignant neoplasm: Secondary | ICD-10-CM
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
## 2022-02-01 ENCOUNTER — Other Ambulatory Visit: Payer: Self-pay | Admitting: Internal Medicine

## 2022-02-01 DIAGNOSIS — E119 Type 2 diabetes mellitus without complications: Secondary | ICD-10-CM

## 2022-04-10 ENCOUNTER — Other Ambulatory Visit: Payer: Self-pay | Admitting: Internal Medicine

## 2022-04-10 DIAGNOSIS — E119 Type 2 diabetes mellitus without complications: Secondary | ICD-10-CM

## 2022-07-16 ENCOUNTER — Other Ambulatory Visit: Payer: Self-pay | Admitting: Internal Medicine

## 2022-07-16 DIAGNOSIS — E119 Type 2 diabetes mellitus without complications: Secondary | ICD-10-CM

## 2022-10-27 ENCOUNTER — Emergency Department (HOSPITAL_BASED_OUTPATIENT_CLINIC_OR_DEPARTMENT_OTHER)
Admission: EM | Admit: 2022-10-27 | Discharge: 2022-10-28 | Disposition: A | Discharge status: Home / Self Care | Payer: Worker's Compensation | Attending: Emergency Medicine | Admitting: Emergency Medicine

## 2022-10-27 ENCOUNTER — Other Ambulatory Visit: Payer: Self-pay

## 2022-10-27 DIAGNOSIS — W230XXA Caught, crushed, jammed, or pinched between moving objects, initial encounter: Secondary | ICD-10-CM | POA: Diagnosis not present

## 2022-10-27 DIAGNOSIS — S67194A Crushing injury of right ring finger, initial encounter: Secondary | ICD-10-CM | POA: Diagnosis not present

## 2022-10-27 DIAGNOSIS — S6710XA Crushing injury of unspecified finger(s), initial encounter: Secondary | ICD-10-CM

## 2022-10-27 DIAGNOSIS — Z23 Encounter for immunization: Secondary | ICD-10-CM | POA: Insufficient documentation
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
## 2022-10-28 ENCOUNTER — Emergency Department (HOSPITAL_BASED_OUTPATIENT_CLINIC_OR_DEPARTMENT_OTHER): Payer: Worker's Compensation

## 2022-10-28 MED ORDER — TETANUS-DIPHTH-ACELL PERTUSSIS 5-2.5-18.5 LF-MCG/0.5 IM SUSY
0.5000 mL | PREFILLED_SYRINGE | Freq: Once | INTRAMUSCULAR | Status: AC
  Administered 2022-10-28: 0.5 mL via INTRAMUSCULAR
  Filled 2022-10-28: qty 0.5

## 2022-10-28 NOTE — ED Notes (Signed)
Reviewed AVS with patient, patient expressed understanding of directions, denies further questions at this time. Reviewed wound care with patient.

## 2022-10-28 NOTE — ED Notes (Signed)
Applied bacitracin to laceration to ring finger of right hand, covered with bandaid.

## 2022-10-28 NOTE — ED Notes (Signed)
X-ray at bedside

## 2022-10-28 NOTE — ED Provider Notes (Signed)
[facility]  Provider Note  CSN: [id number] Arrival date & time: 10/27/22 2351  History Chief Complaint  Patient presents with   Finger Injury    Christian Vance is a 62 y.o. male reports he got his R ring finger closed in a door at [employer] earlier tonight. He is complaining of some pain to the end of that finger.Unsure last TDAP.    Home Medications Prior to Admission medications   Medication Sig Start Date End Date Taking? Authorizing Provider  ACCU-CHEK GUIDE test strip USE AS DIRECTED 4 TIMES PER DAY Patient not taking: Reported on 11/11/2021 06/18/21   [name], [name], MD  Accu-Chek Softclix Lancets lancets USE AS DIRECTED 4 TIMES A DAY Patient not taking: Reported on 11/11/2021 06/17/21   [name], [name], MD  acetaminophen (TYLENOL) 650 MG CR tablet Take 650 mg by mouth every 8 (eight) hours as needed for pain.    [provider]  cetirizine (ZYRTEC) 10 MG tablet Take 1 tablet (10 mg total) by mouth daily. 01/22/20   [name], [name], MD  metFORMIN (GLUCOPHAGE) 1000 MG tablet TAKE 1 TABLET TWICE A DAY WITH A MEAL 07/19/22   [name], [name], MD  methocarbamol (ROBAXIN) 500 MG tablet Take 1 tablet (500 mg total) by mouth every 6 (six) hours as needed for muscle spasms. 11/16/21   Corinne Ports, PA-C  Multiple Vitamin (MULTIVITAMIN WITH MINERALS) TABS tablet Take 1 tablet by mouth daily. Centrum    [provider]  Oxycodone HCl 10 MG TABS Take 0.5-1 tablets (5-10 mg total) by mouth every 4 (four) hours as needed (5mg  for moderate pain, 10mg  for severe pain). 11/16/21   Corinne Ports, PA-C  polyethylene glycol (MIRALAX / GLYCOLAX) 17 g packet Take 17 g by mouth daily as needed for mild constipation. 11/16/21   Corinne Ports, PA-C  sildenafil (VIAGRA) 100 MG tablet Take 0.5-1 tablets (50-100 mg total) by mouth daily as needed for erectile dysfunction. 02/25/21   [name],  [name], MD     Allergies    Patient has no known allergies.   Review of Systems   Review of Systems Please see HPI for pertinent positives and negatives  Physical Exam BP (!) 135/97   Pulse 100   Temp 98.2 F (36.8 C) (Oral)   Resp 16   SpO2 100%   Physical Exam Vitals and nursing note reviewed.  HENT:     Head: Normocephalic.     Nose: Nose normal.  Eyes:     Extraocular Movements: Extraocular movements intact.  Pulmonary:     Effort: Pulmonary effort is normal.  Musculoskeletal:        General: Tenderness present. Normal range of motion.     Cervical back: Neck supple.     Comments: Superficial abrasion to the cuticle of the R 4th finger. No repairable laceration. No deformity  Skin:    Findings: No rash (on exposed skin).  Neurological:     Mental Status: He is alert and oriented to person, place, and time.  Psychiatric:        Mood and Affect: Mood normal.     ED Results / Procedures / Treatments   EKG None  Procedures Procedures  Medications Ordered in the ED Medications  Tdap (BOOSTRIX) injection 0.5 mL (0.5 mLs Intramuscular Given 10/28/22 0033)    Initial Impression and Plan  Patient here with crush injury of R 4th  finger. No repairable laceration, just an abrasion over the cuticle. Will check and xray. Patient advised his nail may fall off, but should grow back if so. RN to clean and dress wound. TDAP updated.   ED Course   Clinical Course as of 10/28/22 0051  Thu Oct 28, 2022  0046 I personally viewed the images from radiology studies and agree with radiologist interpretation: Xray neg for bony injury. Plan discharge with PCP follow up, RTED for any other concerns.    [CS]    Clinical Course User Index [CS] Truddie Hidden, MD     MDM Rules/Calculators/A&P Medical Decision Making Problems Addressed: Crushing injury of finger of right hand: acute illness or injury  Amount and/or Complexity of Data Reviewed Radiology: ordered and  independent interpretation performed. Decision-making details documented in ED Course.  Risk Prescription drug management.     Final Clinical Impression(s) / ED Diagnoses Final diagnoses:  Crushing injury of finger of right hand    Rx / DC Orders ED Discharge Orders     None        Truddie Hidden, MD 10/28/22 [phone]

## 2022-10-28 NOTE — ED Notes (Signed)
Patient's finger soaking in betadine and saline.

## 2022-10-28 NOTE — ED Notes (Signed)
Patient's bandage removed and wound cleaned with sterile saline and assessed.  Patient has small laceration to the nailbed and tip of right ring finger. Bleeding controlled at this time.

## 2022-12-25 ENCOUNTER — Other Ambulatory Visit: Payer: Self-pay | Admitting: Internal Medicine

## 2022-12-25 DIAGNOSIS — E119 Type 2 diabetes mellitus without complications: Secondary | ICD-10-CM

## 2023-06-08 ENCOUNTER — Other Ambulatory Visit (HOSPITAL_COMMUNITY): Payer: Self-pay

## 2023-07-05 LAB — HM DIABETES EYE EXAM

## 2023-07-08 IMAGING — DX DG CLAVICLE*L*
2 series · 2 of 2 positions shown · non-contrast
Comparison: 11/13/2021

CLINICAL DATA: ORIF LEFT clavicle

EXAM:
LEFT CLAVICLE - 2+ VIEWS

[clavicle ap]
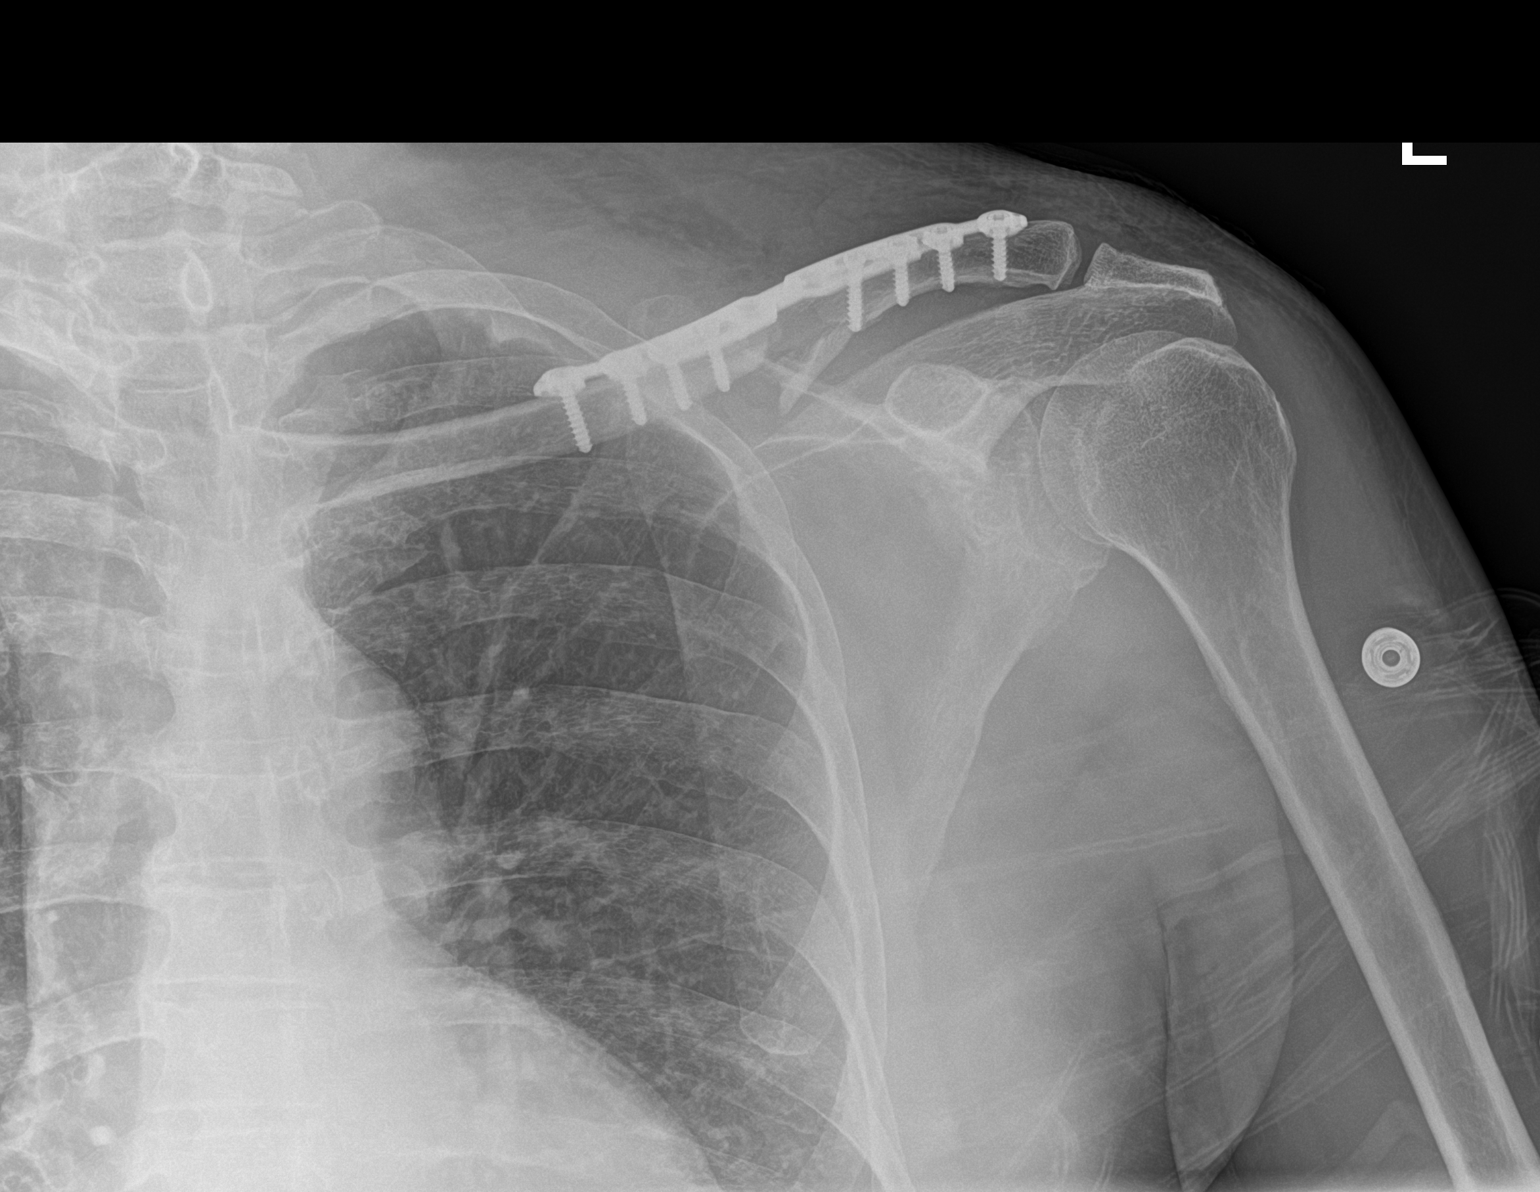

[clavicle axial]
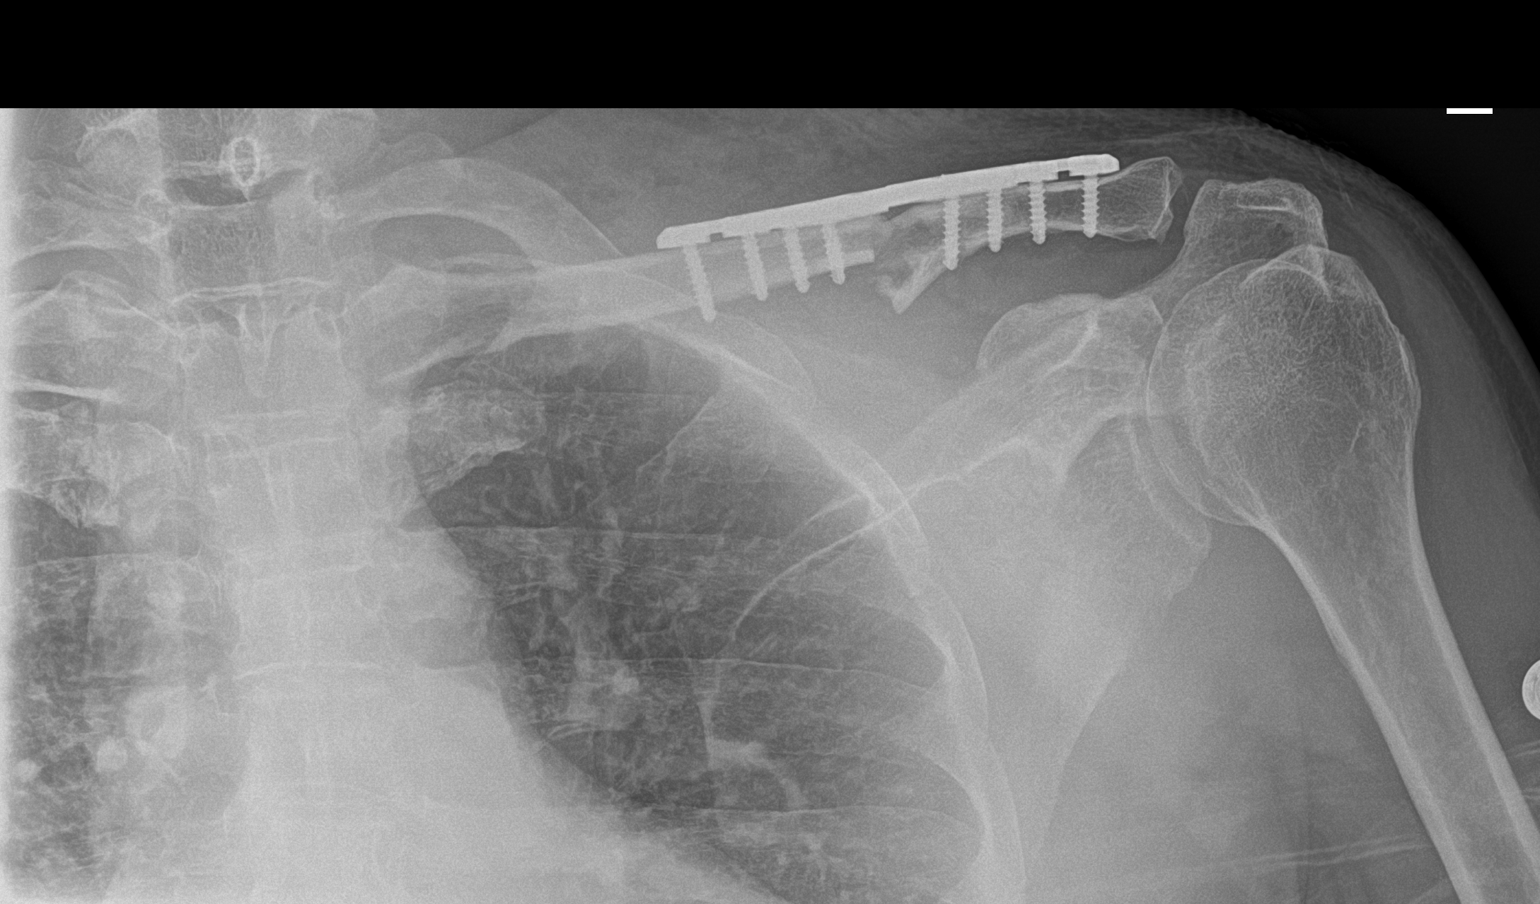

[2 of 2 positions shown; findings below may reference images not displayed]

FINDINGS: Plate and multiple screws across a reduced fracture of the distal
LEFT clavicle the junction of the middle and distal thirds.

Single mildly displaced fracture fragment remains.

AC joint alignment normal.
IMPRESSION: Post ORIF of LEFT clavicular fracture.

## 2023-08-24 ENCOUNTER — Encounter: Payer: Self-pay | Admitting: Internal Medicine

## 2023-08-24 ENCOUNTER — Ambulatory Visit (INDEPENDENT_AMBULATORY_CARE_PROVIDER_SITE_OTHER): Payer: 59 | Admitting: Internal Medicine

## 2023-08-24 VITALS — BP 110/70 | HR 73 | Temp 98.0°F | Ht 69.5 in | Wt 175.7 lb

## 2023-08-24 DIAGNOSIS — H9193 Unspecified hearing loss, bilateral: Secondary | ICD-10-CM | POA: Diagnosis not present

## 2023-08-24 DIAGNOSIS — Z0000 Encounter for general adult medical examination without abnormal findings: Secondary | ICD-10-CM | POA: Diagnosis not present

## 2023-08-24 DIAGNOSIS — E119 Type 2 diabetes mellitus without complications: Secondary | ICD-10-CM | POA: Diagnosis not present

## 2023-08-24 DIAGNOSIS — B181 Chronic viral hepatitis B without delta-agent: Secondary | ICD-10-CM

## 2023-08-24 DIAGNOSIS — H04203 Unspecified epiphora, bilateral lacrimal glands: Secondary | ICD-10-CM

## 2023-08-24 DIAGNOSIS — Z23 Encounter for immunization: Secondary | ICD-10-CM

## 2023-08-24 LAB — LIPID PANEL
Cholesterol: 109 mg/dL (ref 0–200)
HDL: 57.2 mg/dL (ref >39.00)
LDL Cholesterol: 33 mg/dL (ref 0–99)
NonHDL: 51.92
Total CHOL/HDL Ratio: 2
Triglycerides: 93 mg/dL (ref 0.0–149.0)
VLDL: 18.6 mg/dL (ref 0.0–40.0)

## 2023-08-24 LAB — COMPREHENSIVE METABOLIC PANEL
ALT: 14 U/L (ref 0–53)
AST: 15 U/L (ref 0–37)
Albumin: 4.5 g/dL (ref 3.5–5.2)
Alkaline Phosphatase: 77 U/L (ref 39–117)
BUN: 9 mg/dL (ref 6–23)
CO2: 29 meq/L (ref 19–32)
Calcium: 9.4 mg/dL (ref 8.4–10.5)
Chloride: 104 meq/L (ref 96–112)
Creatinine, Ser: 0.74 mg/dL (ref 0.40–1.50)
GFR: 95.64 mL/min (ref >60.00)
Glucose, Bld: 112 mg/dL — ABNORMAL HIGH (ref 70–99)
Potassium: 3.8 meq/L (ref 3.5–5.1)
Sodium: 140 meq/L (ref 135–145)
Total Bilirubin: 0.4 mg/dL (ref 0.2–1.2)
Total Protein: 7.3 g/dL (ref 6.0–8.3)

## 2023-08-24 LAB — CBC WITH DIFFERENTIAL/PLATELET
Basophils Absolute: 0 10*3/uL (ref 0.0–0.1)
Basophils Relative: 0.7 % (ref 0.0–3.0)
Eosinophils Absolute: 0.1 10*3/uL (ref 0.0–0.7)
Eosinophils Relative: 2 % (ref 0.0–5.0)
HCT: 45.2 % (ref 39.0–52.0)
Hemoglobin: 14.6 g/dL (ref 13.0–17.0)
Lymphocytes Relative: 51.8 % — ABNORMAL HIGH (ref 12.0–46.0)
Lymphs Abs: 2.2 10*3/uL (ref 0.7–4.0)
MCHC: 32.3 g/dL (ref 30.0–36.0)
MCV: 98.4 fL (ref 78.0–100.0)
Monocytes Absolute: 0.3 10*3/uL (ref 0.1–1.0)
Monocytes Relative: 7.5 % (ref 3.0–12.0)
Neutro Abs: 1.6 10*3/uL (ref 1.4–7.7)
Neutrophils Relative %: 38 % — ABNORMAL LOW (ref 43.0–77.0)
Platelets: 193 10*3/uL (ref 150.0–400.0)
RBC: 4.6 Mil/uL (ref 4.22–5.81)
RDW: 14.5 % (ref 11.5–15.5)
WBC: 4.3 10*3/uL (ref 4.0–10.5)

## 2023-08-24 LAB — PSA: PSA: 1.78 ng/mL (ref 0.10–4.00)

## 2023-08-24 LAB — HEMOGLOBIN A1C: Hgb A1c MFr Bld: 8.1 % — ABNORMAL HIGH (ref 4.6–6.5)

## 2023-08-24 NOTE — Addendum Note (Signed)
 Addended by: Nicolina Barrios B on: 08/24/2023 12:00 PM   Modules accepted: Orders

## 2023-08-24 NOTE — Progress Notes (Signed)
 Established Patient Office Visit     CC/Reason for Visit: Annual preventive exam, follow-up chronic, discuss acute concerns, requesting vaccinations  HPI: Christian Vance is a 63 y.o. male who is coming in today for the above mentioned reasons. Past Medical History is significant for: Type 2 diabetes and chronic hepatitis B.  He has not been seen in around 18 months.  He has been complaining of decreased hearing.  Has also had chronically watery eyes despite being on daily cetirizine  and having a normal report from his ophthalmologist.  Is overdue for COVID, flu, pneumonia, shingles, RSV vaccines.  He had a colonoscopy in 2021.   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  followed by [facility] ID service.      Past Surgical History:  Procedure Laterality Date   BIOPSY  01/19/2020   Procedure: BIOPSY;  Surgeon: Asencion Blacksmith, MD;  Location: [facility] ENDOSCOPY;  Service: Endoscopy;;   COLONOSCOPY WITH PROPOFOL  N/A 01/19/2020   Procedure: COLONOSCOPY WITH PROPOFOL ;  Surgeon: Asencion Blacksmith, MD;  Location: [facility] ENDOSCOPY;  Service: Endoscopy;  Laterality: N/A;   ESOPHAGOGASTRODUODENOSCOPY (EGD) WITH PROPOFOL  N/A 01/19/2020   Procedure: ESOPHAGOGASTRODUODENOSCOPY (EGD) WITH PROPOFOL ;  Surgeon: Asencion Blacksmith, MD;  Location: [facility] ENDOSCOPY;  Service: Endoscopy;  Laterality: N/A;   HOT HEMOSTASIS N/A 01/19/2020   Procedure: HOT HEMOSTASIS (ARGON PLASMA COAGULATION/BICAP);  Surgeon: Asencion Blacksmith, MD;  Location: [facility] ENDOSCOPY;  Service: Endoscopy;  Laterality: N/A;   ORIF ANKLE FRACTURE Right 11/13/2021   Procedure: OPEN REDUCTION INTERNAL FIXATION (ORIF) ANKLE FRACTURE;  Surgeon: Laneta Pintos, MD;  Location: [facility] OR;  Service: Orthopedics;  Laterality: Right;   ORIF CLAVICULAR FRACTURE Left 11/13/2021   Procedure: OPEN REDUCTION INTERNAL FIXATION (ORIF)  CLAVICULAR FRACTURE;  Surgeon: Laneta Pintos, MD;  Location: [facility] OR;  Service: Orthopedics;  Laterality: Left;   POLYPECTOMY  01/19/2020   Procedure: POLYPECTOMY;  Surgeon: Asencion Blacksmith, MD;  Location: [facility] ENDOSCOPY;  Service: Endoscopy;;    Social History:  reports that he has never smoked. He has never used smokeless tobacco. He reports that he does not drink alcohol and does not use drugs.  Allergies: No Known Allergies  Family History:  Family History  Problem Relation Age of Onset   Colon cancer Neg Hx      Current Outpatient Medications:    ACCU-CHEK GUIDE test strip, USE AS DIRECTED 4 TIMES PER DAY, Disp: 100 strip, Rfl: 2   Accu-Chek Softclix Lancets lancets, USE AS DIRECTED 4 TIMES A DAY, Disp: 100 each, Rfl: 3   acetaminophen  (TYLENOL ) 650 MG CR tablet, Take 650 mg by mouth every 8 (eight) hours as needed for pain., Disp: , Rfl:    cetirizine  (ZYRTEC ) 10 MG tablet, Take 1 tablet (10 mg total) by mouth daily., Disp: 90 tablet, Rfl: 1   metFORMIN  (GLUCOPHAGE ) 1000 MG tablet, TAKE 1 TABLET TWICE A DAY WITH A MEAL, Disp: 180 tablet, Rfl: 0   methocarbamol  (ROBAXIN ) 500 MG tablet, Take 1 tablet (500 mg total) by mouth every 6 (six) hours as needed for muscle spasms., Disp: 28 tablet, Rfl: 0   Multiple Vitamin (MULTIVITAMIN WITH MINERALS) TABS tablet, Take 1 tablet by mouth daily. Centrum, Disp: , Rfl:    Oxycodone  HCl 10 MG TABS, Take 0.5-1 tablets (5-10 mg total) by mouth every 4 (four)  hours as needed (5mg  for moderate pain, 10mg  for severe pain)., Disp: 42 tablet, Rfl: 0   polyethylene glycol (MIRALAX  / GLYCOLAX ) 17 g packet, Take 17 g by mouth daily as needed for mild constipation., Disp: , Rfl:    sildenafil  (VIAGRA ) 100 MG tablet, Take 0.5-1 tablets (50-100 mg total) by mouth daily as needed for erectile dysfunction., Disp: 5 tablet, Rfl: 11  Review of Systems:  Negative unless indicated in HPI.   Physical Exam: Vitals:   08/24/23 1035  BP: 110/70  Pulse: 73   Temp: 98 F (36.7 C)  TempSrc: Oral  SpO2: 97%  Weight: 175 lb 11.2 oz (79.7 kg)  Height: 5' 9.5" (1.765 m)    Body mass index is 25.57 kg/m.   Physical Exam Vitals reviewed.  Constitutional:      General: He is not in acute distress.    Appearance: Normal appearance. He is not ill-appearing, toxic-appearing or diaphoretic.  HENT:     Head: Normocephalic.     Right Ear: Tympanic membrane, ear canal and external ear normal. There is no impacted cerumen.     Left Ear: Tympanic membrane, ear canal and external ear normal. There is no impacted cerumen.     Nose: Nose normal.     Mouth/Throat:     Mouth: Mucous membranes are moist.     Pharynx: Oropharynx is clear. No oropharyngeal exudate or posterior oropharyngeal erythema.  Eyes:     General: No scleral icterus.       Right eye: No discharge.        Left eye: No discharge.     Conjunctiva/sclera: Conjunctivae normal.     Pupils: Pupils are equal, round, and reactive to light.  Neck:     Vascular: No carotid bruit.  Cardiovascular:     Rate and Rhythm: Normal rate and regular rhythm.     Pulses: Normal pulses.     Heart sounds: Normal heart sounds.  Pulmonary:     Effort: Pulmonary effort is normal. No respiratory distress.     Breath sounds: Normal breath sounds.  Abdominal:     General: Abdomen is flat. Bowel sounds are normal.     Palpations: Abdomen is soft.  Musculoskeletal:        General: Normal range of motion.     Cervical back: Normal range of motion.  Skin:    General: Skin is warm and dry.  Neurological:     General: No focal deficit present.     Mental Status: He is alert and oriented to person, place, and time. Mental status is at baseline.  Psychiatric:        Mood and Affect: Mood normal.        Behavior: Behavior normal.        Thought Content: Thought content normal.        Judgment: Judgment normal.     Flowsheet Row Office Visit from 08/24/2023 in [facility] [place]   PHQ-9 Total Score 0        Impression and Plan:  Encounter for preventive health examination -     PSA; Future  Type 2 diabetes mellitus without complication, without long-term current use of insulin  (HCC) -     Hemoglobin A1c; Future -     CBC with Differential/Platelet; Future -     Comprehensive metabolic panel; Future -     Lipid panel; Future -     Microalbumin / creatinine urine ratio; Future  Hepatitis B,  chronic (HCC)  Decreased hearing of both ears -     Ambulatory referral to Audiology  Watery eyes -     Ambulatory referral to Allergy  Immunization due     -Recommend routine eye and dental care. -Healthy lifestyle discussed in detail. -Labs to be updated today. -Prostate cancer screening: PSA today Health Maintenance  Topic Date Due   Zoster (Shingles) Vaccine (1 of 2) Never done   Pneumococcal Vaccination (2 of 2 - PCV) 03/13/2015   Eye exam for diabetics  09/30/2020   Hemoglobin A1C  03/14/2022   Yearly kidney health urinalysis for diabetes  06/12/2022   Yearly kidney function blood test for diabetes  11/17/2022   Flu Shot  03/10/2023   COVID-19 Vaccine (4 - 2024-25 season) 04/10/2023   Complete foot exam   08/23/2024   Colon Cancer Screening  01/18/2030   DTaP/Tdap/Td vaccine (4 - Td or Tdap) 10/27/2032   Hepatitis C Screening  Completed   HIV Screening  Completed   HPV Vaccine  Aged Out    -Flu and PCV 20 in office today.  He will do other vaccines at a later date. -Referral to audiology placed. -Referral to allergist placed.     Marguerita Shih, MD [facility]

## 2023-08-25 ENCOUNTER — Encounter: Payer: Self-pay | Admitting: Internal Medicine

## 2023-08-25 ENCOUNTER — Other Ambulatory Visit: Payer: Self-pay | Admitting: Internal Medicine

## 2023-08-25 DIAGNOSIS — E1169 Type 2 diabetes mellitus with other specified complication: Secondary | ICD-10-CM

## 2023-08-25 LAB — MICROALBUMIN / CREATININE URINE RATIO
Creatinine,U: 411.1 mg/dL
Microalb Creat Ratio: 0.3 mg/g (ref 0.0–30.0)
Microalb, Ur: 1.3 mg/dL (ref 0.0–1.9)

## 2023-08-25 MED ORDER — OZEMPIC (0.25 OR 0.5 MG/DOSE) 2 MG/3ML ~~LOC~~ SOPN: 0.5000 mg | PEN_INJECTOR | SUBCUTANEOUS | 2 refills | Status: DC

## 2023-09-30 ENCOUNTER — Other Ambulatory Visit: Payer: Self-pay | Admitting: Internal Medicine

## 2023-09-30 DIAGNOSIS — E119 Type 2 diabetes mellitus without complications: Secondary | ICD-10-CM

## 2023-11-29 ENCOUNTER — Ambulatory Visit: Payer: 59 | Admitting: Internal Medicine

## 2023-11-29 DIAGNOSIS — E1169 Type 2 diabetes mellitus with other specified complication: Secondary | ICD-10-CM

## 2023-12-08 ENCOUNTER — Ambulatory Visit: Admitting: Internal Medicine

## 2023-12-08 ENCOUNTER — Encounter: Payer: Self-pay | Admitting: Internal Medicine

## 2023-12-08 VITALS — BP 108/70 | HR 80 | Temp 98.1°F | Wt 175.2 lb

## 2023-12-08 DIAGNOSIS — Z23 Encounter for immunization: Secondary | ICD-10-CM | POA: Diagnosis not present

## 2023-12-08 DIAGNOSIS — E1169 Type 2 diabetes mellitus with other specified complication: Secondary | ICD-10-CM

## 2023-12-08 DIAGNOSIS — B181 Chronic viral hepatitis B without delta-agent: Secondary | ICD-10-CM | POA: Diagnosis not present

## 2023-12-08 DIAGNOSIS — Z7984 Long term (current) use of oral hypoglycemic drugs: Secondary | ICD-10-CM | POA: Diagnosis not present

## 2023-12-08 LAB — POCT GLYCOSYLATED HEMOGLOBIN (HGB A1C): Hemoglobin A1C: 7.7 % — AB (ref 4.0–5.6)

## 2023-12-08 MED ORDER — EMPAGLIFLOZIN 10 MG PO TABS: 10.0000 mg | ORAL_TABLET | Freq: Every day | ORAL | 1 refills | Status: DC

## 2023-12-08 NOTE — Progress Notes (Signed)
 Established Patient Office Visit     CC/Reason for Visit: Follow-up conditions  HPI: Christian Vance is a 63 y.o. male who is coming in today for the above mentioned reasons. Past Medical History is significant for: Type 2 diabetes and chronic hepatitis B.  He did not start semaglutide  prescribed at last visit as he had concerns about weight loss as he is already has only a slightly elevated BMI.  At that time his A1c was 8.1.  He is coming in today for follow-up.  Has no acute concerns.   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  followed by [facility] ID service.      Past Surgical History:  Procedure Laterality Date   BIOPSY  01/19/2020   Procedure: BIOPSY;  Surgeon: Asencion Blacksmith, MD;  Location: [facility] ENDOSCOPY;  Service: Endoscopy;;   COLONOSCOPY WITH PROPOFOL  N/A 01/19/2020   Procedure: COLONOSCOPY WITH PROPOFOL ;  Surgeon: Asencion Blacksmith, MD;  Location: [facility] ENDOSCOPY;  Service: Endoscopy;  Laterality: N/A;   ESOPHAGOGASTRODUODENOSCOPY (EGD) WITH PROPOFOL  N/A 01/19/2020   Procedure: ESOPHAGOGASTRODUODENOSCOPY (EGD) WITH PROPOFOL ;  Surgeon: Asencion Blacksmith, MD;  Location: [facility] ENDOSCOPY;  Service: Endoscopy;  Laterality: N/A;   HOT HEMOSTASIS N/A 01/19/2020   Procedure: HOT HEMOSTASIS (ARGON PLASMA COAGULATION/BICAP);  Surgeon: Asencion Blacksmith, MD;  Location: [facility] ENDOSCOPY;  Service: Endoscopy;  Laterality: N/A;   ORIF ANKLE FRACTURE Right 11/13/2021   Procedure: OPEN REDUCTION INTERNAL FIXATION (ORIF) ANKLE FRACTURE;  Surgeon: Laneta Pintos, MD;  Location: [facility] OR;  Service: Orthopedics;  Laterality: Right;   ORIF CLAVICULAR FRACTURE Left 11/13/2021   Procedure: OPEN REDUCTION INTERNAL FIXATION (ORIF) CLAVICULAR FRACTURE;  Surgeon: Laneta Pintos, MD;  Location: [facility] OR;  Service: Orthopedics;  Laterality: Left;   POLYPECTOMY  01/19/2020    Procedure: POLYPECTOMY;  Surgeon: Asencion Blacksmith, MD;  Location: [facility] ENDOSCOPY;  Service: Endoscopy;;    Social History:  reports that he has never smoked. He has never used smokeless tobacco. He reports that he does not drink alcohol and does not use drugs.  Allergies: No Known Allergies  Family History:  Family History  Problem Relation Age of Onset   Colon cancer Neg Hx      Current Outpatient Medications:    ACCU-CHEK GUIDE test strip, USE AS DIRECTED 4 TIMES PER DAY, Disp: 100 strip, Rfl: 2   Accu-Chek Softclix Lancets lancets, USE AS DIRECTED 4 TIMES A DAY, Disp: 100 each, Rfl: 3   acetaminophen  (TYLENOL ) 650 MG CR tablet, Take 650 mg by mouth every 8 (eight) hours as needed for pain., Disp: , Rfl:    cetirizine  (ZYRTEC ) 10 MG tablet, Take 1 tablet (10 mg total) by mouth daily., Disp: 90 tablet, Rfl: 1   empagliflozin  (JARDIANCE ) 10 MG TABS tablet, Take 1 tablet (10 mg total) by mouth daily before breakfast., Disp: 90 tablet, Rfl: 1   metFORMIN  (GLUCOPHAGE ) 1000 MG tablet, TAKE 1 TABLET TWICE A DAY WITH A MEAL, Disp: 180 tablet, Rfl: 1   methocarbamol  (ROBAXIN ) 500 MG tablet, Take 1 tablet (500 mg total) by mouth every 6 (six) hours as needed for muscle spasms., Disp: 28 tablet, Rfl: 0   Multiple Vitamin (MULTIVITAMIN WITH MINERALS) TABS tablet, Take 1 tablet by mouth daily. Centrum, Disp: , Rfl:    Oxycodone  HCl 10 MG TABS, Take 0.5-1  tablets (5-10 mg total) by mouth every 4 (four) hours as needed (5mg  for moderate pain, 10mg  for severe pain)., Disp: 42 tablet, Rfl: 0   polyethylene glycol (MIRALAX  / GLYCOLAX ) 17 g packet, Take 17 g by mouth daily as needed for mild constipation., Disp: , Rfl:    sildenafil  (VIAGRA ) 100 MG tablet, Take 0.5-1 tablets (50-100 mg total) by mouth daily as needed for erectile dysfunction., Disp: 5 tablet, Rfl: 11  Review of Systems:  Negative unless indicated in HPI.   Physical Exam: Vitals:   12/08/23 1432  BP: 108/70  Pulse: 80  Temp: 98.1  F (36.7 C)  TempSrc: Oral  SpO2: 99%  Weight: 175 lb 3.2 oz (79.5 kg)    Body mass index is 25.5 kg/m.   Physical Exam Vitals reviewed.  Constitutional:      Appearance: Normal appearance.  HENT:     Head: Normocephalic and atraumatic.  Eyes:     Conjunctiva/sclera: Conjunctivae normal.  Cardiovascular:     Rate and Rhythm: Normal rate and regular rhythm.  Pulmonary:     Effort: Pulmonary effort is normal.     Breath sounds: Normal breath sounds.  Skin:    General: Skin is warm and dry.  Neurological:     General: No focal deficit present.     Mental Status: He is alert and oriented to person, place, and time.  Psychiatric:        Mood and Affect: Mood normal.        Behavior: Behavior normal.        Thought Content: Thought content normal.        Judgment: Judgment normal.      Impression and Plan:  Type 2 diabetes mellitus with other specified complication, without long-term current use of insulin  (HCC) -     POCT glycosylated hemoglobin (Hb A1C) -     Empagliflozin ; Take 1 tablet (10 mg total) by mouth daily before breakfast.  Dispense: 90 tablet; Refill: 1  Need for shingles vaccine -     Varicella-zoster vaccine IM  Hepatitis B, chronic (HCC)  -Continue metformin , start Jardiance , hold off on Ozempic  for now.  Follow-up in 3 months. - First shingles vaccine administered today.   Time spent:30 minutes reviewing chart, interviewing and examining patient and formulating plan of care.     Marguerita Shih, MD [facility] at [facility]

## 2023-12-12 ENCOUNTER — Encounter: Payer: Self-pay | Admitting: Internal Medicine

## 2023-12-13 ENCOUNTER — Other Ambulatory Visit: Payer: Self-pay

## 2023-12-13 DIAGNOSIS — E119 Type 2 diabetes mellitus without complications: Secondary | ICD-10-CM

## 2023-12-13 DIAGNOSIS — R7302 Impaired glucose tolerance (oral): Secondary | ICD-10-CM

## 2023-12-13 MED ORDER — LANCET DEVICE MISC: 1.0000 | Freq: Three times a day (TID) | 0 refills | Status: AC

## 2023-12-13 MED ORDER — BLOOD GLUCOSE TEST VI STRP: 1.0000 | ORAL_STRIP | Freq: Three times a day (TID) | 0 refills | Status: AC

## 2023-12-13 MED ORDER — LANCETS MISC. MISC: 1.0000 | Freq: Three times a day (TID) | 0 refills | Status: AC

## 2023-12-13 MED ORDER — BLOOD GLUCOSE MONITORING SUPPL DEVI: 1.0000 | Freq: Three times a day (TID) | 0 refills | Status: DC

## 2024-01-15 ENCOUNTER — Other Ambulatory Visit: Payer: Self-pay | Admitting: Internal Medicine

## 2024-01-15 DIAGNOSIS — R7302 Impaired glucose tolerance (oral): Secondary | ICD-10-CM

## 2024-01-15 DIAGNOSIS — E119 Type 2 diabetes mellitus without complications: Secondary | ICD-10-CM

## 2024-03-12 ENCOUNTER — Ambulatory Visit: Admitting: Internal Medicine

## 2024-03-12 DIAGNOSIS — E119 Type 2 diabetes mellitus without complications: Secondary | ICD-10-CM

## 2024-03-21 ENCOUNTER — Ambulatory Visit (INDEPENDENT_AMBULATORY_CARE_PROVIDER_SITE_OTHER): Admitting: Internal Medicine

## 2024-03-21 ENCOUNTER — Encounter: Payer: Self-pay | Admitting: Internal Medicine

## 2024-03-21 VITALS — BP 102/70 | HR 76 | Temp 98.1°F | Ht 69.5 in | Wt 176.2 lb

## 2024-03-21 DIAGNOSIS — Z7985 Long-term (current) use of injectable non-insulin antidiabetic drugs: Secondary | ICD-10-CM | POA: Diagnosis not present

## 2024-03-21 DIAGNOSIS — Z7984 Long term (current) use of oral hypoglycemic drugs: Secondary | ICD-10-CM

## 2024-03-21 DIAGNOSIS — E1169 Type 2 diabetes mellitus with other specified complication: Secondary | ICD-10-CM | POA: Diagnosis not present

## 2024-03-21 LAB — POCT GLYCOSYLATED HEMOGLOBIN (HGB A1C): Hemoglobin A1C: 9 % — AB (ref 4.0–5.6)

## 2024-03-21 MED ORDER — OZEMPIC (0.25 OR 0.5 MG/DOSE) 2 MG/3ML ~~LOC~~ SOPN: PEN_INJECTOR | SUBCUTANEOUS | 2 refills | Status: AC

## 2024-03-21 NOTE — Assessment & Plan Note (Addendum)
 Not well-controlled on Jardiance  alone, A1c has increased from 7.7-9.  Start semaglutide  0.25 mg for the first 4 weeks then increase to 2.5 mg until he sees me again.  He will continue to work on lifestyle changes as well.

## 2024-03-21 NOTE — Progress Notes (Signed)
 Established Patient Office Visit     CC/Reason for Visit: Follow-up chronic conditions  HPI: Christian Vance is a 63 y.o. male who is coming in today for the above mentioned reasons. Past Medical History is significant for: Type 2 diabetes and chronic hepatitis C.  He started Jardiance  last visit.  He discontinued metformin .  He admits to some lifestyle indiscretions.   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  followed by [facility] ID service.      Past Surgical History:  Procedure Laterality Date   BIOPSY  01/19/2020   Procedure: BIOPSY;  Surgeon: Aneita Gwendlyn DASEN, MD;  Location: [facility] ENDOSCOPY;  Service: Endoscopy;;   COLONOSCOPY WITH PROPOFOL  N/A 01/19/2020   Procedure: COLONOSCOPY WITH PROPOFOL ;  Surgeon: Aneita Gwendlyn DASEN, MD;  Location: [facility] ENDOSCOPY;  Service: Endoscopy;  Laterality: N/A;   ESOPHAGOGASTRODUODENOSCOPY (EGD) WITH PROPOFOL  N/A 01/19/2020   Procedure: ESOPHAGOGASTRODUODENOSCOPY (EGD) WITH PROPOFOL ;  Surgeon: Aneita Gwendlyn DASEN, MD;  Location: [facility] ENDOSCOPY;  Service: Endoscopy;  Laterality: N/A;   HOT HEMOSTASIS N/A 01/19/2020   Procedure: HOT HEMOSTASIS (ARGON PLASMA COAGULATION/BICAP);  Surgeon: Aneita Gwendlyn DASEN, MD;  Location: [facility] ENDOSCOPY;  Service: Endoscopy;  Laterality: N/A;   ORIF ANKLE FRACTURE Right 11/13/2021   Procedure: OPEN REDUCTION INTERNAL FIXATION (ORIF) ANKLE FRACTURE;  Surgeon: Kendal Franky SQUIBB, MD;  Location: [facility] OR;  Service: Orthopedics;  Laterality: Right;   ORIF CLAVICULAR FRACTURE Left 11/13/2021   Procedure: OPEN REDUCTION INTERNAL FIXATION (ORIF) CLAVICULAR FRACTURE;  Surgeon: Kendal Franky SQUIBB, MD;  Location: [facility] OR;  Service: Orthopedics;  Laterality: Left;   POLYPECTOMY  01/19/2020   Procedure: POLYPECTOMY;  Surgeon: Aneita Gwendlyn DASEN, MD;  Location: [facility] ENDOSCOPY;  Service: Endoscopy;;    Social History:   reports that he has never smoked. He has never used smokeless tobacco. He reports that he does not drink alcohol and does not use drugs.  Allergies: No Known Allergies  Family History:  Family History  Problem Relation Age of Onset   Colon cancer Neg Hx      Current Outpatient Medications:    ACCU-CHEK GUIDE TEST test strip, 1 EACH BY IN VITRO ROUTE IN THE MORNING, AT NOON, AND AT BEDTIME. MAY SUBSTITUTE TO ANY MANUFACTURER COVERED BY PATIENT'S INSURANCE., Disp: 100 strip, Rfl: 2   Accu-Chek Softclix Lancets lancets, USE AS DIRECTED 4 TIMES A DAY, Disp: 100 each, Rfl: 3   acetaminophen  (TYLENOL ) 650 MG CR tablet, Take 650 mg by mouth every 8 (eight) hours as needed for pain., Disp: , Rfl:    Blood Glucose Monitoring Suppl DEVI, 1 each by Does not apply route in the morning, at noon, and at bedtime. May substitute to any manufacturer covered by patient's insurance., Disp: 1 each, Rfl: 0   cetirizine  (ZYRTEC ) 10 MG tablet, Take 1 tablet (10 mg total) by mouth daily., Disp: 90 tablet, Rfl: 1   empagliflozin  (JARDIANCE ) 10 MG TABS tablet, Take 1 tablet (10 mg total) by mouth daily before breakfast., Disp: 90 tablet, Rfl: 1   methocarbamol  (ROBAXIN ) 500 MG tablet, Take 1 tablet (500 mg total) by mouth every 6 (six) hours as needed for muscle spasms., Disp: 28 tablet, Rfl: 0   Multiple Vitamin (MULTIVITAMIN WITH MINERALS) TABS tablet, Take 1 tablet by mouth daily. Centrum, Disp: , Rfl:    Oxycodone  HCl 10 MG TABS,  Take 0.5-1 tablets (5-10 mg total) by mouth every 4 (four) hours as needed (5mg  for moderate pain, 10mg  for severe pain)., Disp: 42 tablet, Rfl: 0   polyethylene glycol (MIRALAX  / GLYCOLAX ) 17 g packet, Take 17 g by mouth daily as needed for mild constipation., Disp: , Rfl:    Semaglutide ,0.25 or 0.5MG /DOS, (OZEMPIC , 0.25 OR 0.5 MG/DOSE,) 2 MG/3ML SOPN, Inject 0.25 mg once a week for the first 4 weeks and then increase to 0.5 mg weekly., Disp: 3 mL, Rfl: 2   sildenafil  (VIAGRA ) 100 MG  tablet, Take 0.5-1 tablets (50-100 mg total) by mouth daily as needed for erectile dysfunction., Disp: 5 tablet, Rfl: 11  Review of Systems:  Negative unless indicated in HPI.   Physical Exam: Vitals:   03/21/24 0958  BP: 102/70  Pulse: 76  Temp: 98.1 F (36.7 C)  TempSrc: Oral  SpO2: 98%  Weight: 176 lb 3 oz (79.9 kg)  Height: 5' 9.5 (1.765 m)    Body mass index is 25.65 kg/m.   Physical Exam Vitals reviewed.  Constitutional:      Appearance: Normal appearance.  HENT:     Head: Normocephalic and atraumatic.  Eyes:     Conjunctiva/sclera: Conjunctivae normal.  Cardiovascular:     Rate and Rhythm: Normal rate and regular rhythm.  Pulmonary:     Effort: Pulmonary effort is normal.     Breath sounds: Normal breath sounds.  Skin:    General: Skin is warm and dry.  Neurological:     General: No focal deficit present.     Mental Status: He is alert and oriented to person, place, and time.  Psychiatric:        Mood and Affect: Mood normal.        Behavior: Behavior normal.        Thought Content: Thought content normal.        Judgment: Judgment normal.      Impression and Plan:  Type 2 diabetes mellitus with other specified complication, without long-term current use of insulin  (HCC) Assessment & Plan: Not well-controlled on Jardiance  alone, A1c has increased from 7.7-9.  Start semaglutide  0.25 mg for the first 4 weeks then increase to 2.5 mg until he sees me again.  He will continue to work on lifestyle changes as well.  Orders: -     POCT glycosylated hemoglobin (Hb A1C) -     Ozempic  (0.25 or 0.5 MG/DOSE); Inject 0.25 mg once a week for the first 4 weeks and then increase to 0.5 mg weekly.  Dispense: 3 mL; Refill: 2     Time spent:30 minutes reviewing chart, interviewing and examining patient and formulating plan of care.     Tully Theophilus Andrews, MD [facility] at [facility]

## 2024-06-09 ENCOUNTER — Other Ambulatory Visit: Payer: Self-pay | Admitting: Internal Medicine

## 2024-06-09 DIAGNOSIS — E1169 Type 2 diabetes mellitus with other specified complication: Secondary | ICD-10-CM

## 2024-06-28 ENCOUNTER — Inpatient Hospital Stay (HOSPITAL_BASED_OUTPATIENT_CLINIC_OR_DEPARTMENT_OTHER)
Admission: EM | Admit: 2024-06-28 | Discharge: 2024-07-05 | DRG: 377 | Disposition: A | Discharge status: Home / Self Care | Source: Ambulatory Visit | Attending: Family Medicine | Admitting: Family Medicine

## 2024-06-28 ENCOUNTER — Other Ambulatory Visit: Payer: Self-pay

## 2024-06-28 ENCOUNTER — Ambulatory Visit: Payer: Self-pay

## 2024-06-28 ENCOUNTER — Ambulatory Visit: Admitting: Internal Medicine

## 2024-06-28 ENCOUNTER — Emergency Department (HOSPITAL_BASED_OUTPATIENT_CLINIC_OR_DEPARTMENT_OTHER)

## 2024-06-28 DIAGNOSIS — D696 Thrombocytopenia, unspecified: Secondary | ICD-10-CM | POA: Diagnosis present

## 2024-06-28 DIAGNOSIS — D122 Benign neoplasm of ascending colon: Secondary | ICD-10-CM | POA: Diagnosis present

## 2024-06-28 DIAGNOSIS — E119 Type 2 diabetes mellitus without complications: Secondary | ICD-10-CM

## 2024-06-28 DIAGNOSIS — K5900 Constipation, unspecified: Secondary | ICD-10-CM | POA: Diagnosis not present

## 2024-06-28 DIAGNOSIS — Z7984 Long term (current) use of oral hypoglycemic drugs: Secondary | ICD-10-CM

## 2024-06-28 DIAGNOSIS — K922 Gastrointestinal hemorrhage, unspecified: Secondary | ICD-10-CM | POA: Diagnosis not present

## 2024-06-28 DIAGNOSIS — Z7985 Long-term (current) use of injectable non-insulin antidiabetic drugs: Secondary | ICD-10-CM

## 2024-06-28 DIAGNOSIS — D125 Benign neoplasm of sigmoid colon: Secondary | ICD-10-CM | POA: Diagnosis present

## 2024-06-28 DIAGNOSIS — R578 Other shock: Secondary | ICD-10-CM | POA: Diagnosis present

## 2024-06-28 DIAGNOSIS — D127 Benign neoplasm of rectosigmoid junction: Secondary | ICD-10-CM | POA: Diagnosis present

## 2024-06-28 DIAGNOSIS — Z7982 Long term (current) use of aspirin: Secondary | ICD-10-CM

## 2024-06-28 DIAGNOSIS — K648 Other hemorrhoids: Secondary | ICD-10-CM | POA: Diagnosis present

## 2024-06-28 DIAGNOSIS — B181 Chronic viral hepatitis B without delta-agent: Secondary | ICD-10-CM | POA: Diagnosis present

## 2024-06-28 DIAGNOSIS — D649 Anemia, unspecified: Secondary | ICD-10-CM

## 2024-06-28 DIAGNOSIS — K621 Rectal polyp: Secondary | ICD-10-CM | POA: Diagnosis present

## 2024-06-28 DIAGNOSIS — K921 Melena: Secondary | ICD-10-CM | POA: Diagnosis not present

## 2024-06-28 DIAGNOSIS — K5731 Diverticulosis of large intestine without perforation or abscess with bleeding: Secondary | ICD-10-CM | POA: Diagnosis not present

## 2024-06-28 DIAGNOSIS — D62 Acute posthemorrhagic anemia: Secondary | ICD-10-CM | POA: Diagnosis not present

## 2024-06-28 DIAGNOSIS — E1165 Type 2 diabetes mellitus with hyperglycemia: Secondary | ICD-10-CM | POA: Diagnosis present

## 2024-06-28 DIAGNOSIS — E871 Hypo-osmolality and hyponatremia: Secondary | ICD-10-CM | POA: Diagnosis present

## 2024-06-28 LAB — CBC
HCT: 33.5 % — ABNORMAL LOW (ref 39.0–52.0)
HCT: 36.1 % — ABNORMAL LOW (ref 39.0–52.0)
Hemoglobin: 11.2 g/dL — ABNORMAL LOW (ref 13.0–17.0)
Hemoglobin: 12.1 g/dL — ABNORMAL LOW (ref 13.0–17.0)
MCH: 32.4 pg (ref 26.0–34.0)
MCH: 31.8 pg (ref 26.0–34.0)
MCHC: 33.4 g/dL (ref 30.0–36.0)
MCHC: 33.5 g/dL (ref 30.0–36.0)
MCV: 96.8 fL (ref 80.0–100.0)
MCV: 95 fL (ref 80.0–100.0)
Platelets: 129 K/uL — ABNORMAL LOW (ref 150–400)
Platelets: 118 K/uL — ABNORMAL LOW (ref 150–400)
RBC: 3.46 MIL/uL — ABNORMAL LOW (ref 4.22–5.81)
RBC: 3.8 MIL/uL — ABNORMAL LOW (ref 4.22–5.81)
RDW: 14.1 % (ref 11.5–15.5)
RDW: 15 % (ref 11.5–15.5)
WBC: 5.7 K/uL (ref 4.0–10.5)
WBC: 10.7 K/uL — ABNORMAL HIGH (ref 4.0–10.5)
nRBC: 0 % (ref 0.0–0.2)
nRBC: 0 % (ref 0.0–0.2)

## 2024-06-28 LAB — GLUCOSE, CAPILLARY
Glucose-Capillary: 181 mg/dL — ABNORMAL HIGH (ref 70–99)
Glucose-Capillary: 162 mg/dL — ABNORMAL HIGH (ref 70–99)
Glucose-Capillary: 303 mg/dL — ABNORMAL HIGH (ref 70–99)

## 2024-06-28 LAB — COMPREHENSIVE METABOLIC PANEL WITH GFR
ALT: 15 U/L (ref 0–44)
AST: 16 U/L (ref 15–41)
Albumin: 3 g/dL — ABNORMAL LOW (ref 3.5–5.0)
Alkaline Phosphatase: 64 U/L (ref 38–126)
Anion gap: 8 (ref 5–15)
BUN: 14 mg/dL (ref 8–23)
CO2: 21 mmol/L — ABNORMAL LOW (ref 22–32)
Calcium: 7.7 mg/dL — ABNORMAL LOW (ref 8.9–10.3)
Chloride: 105 mmol/L (ref 98–111)
Creatinine, Ser: 0.68 mg/dL (ref 0.61–1.24)
GFR, Estimated: >60 mL/min (ref >60)
Glucose, Bld: 227 mg/dL — ABNORMAL HIGH (ref 70–99)
Potassium: 4.2 mmol/L (ref 3.5–5.1)
Sodium: 134 mmol/L — ABNORMAL LOW (ref 135–145)
Total Bilirubin: 0.3 mg/dL (ref 0.0–1.2)
Total Protein: 5.1 g/dL — ABNORMAL LOW (ref 6.5–8.1)

## 2024-06-28 LAB — URINALYSIS, ROUTINE W REFLEX MICROSCOPIC
Bilirubin Urine: NEGATIVE
Glucose, UA: 500 mg/dL — AB
Hgb urine dipstick: NEGATIVE
Ketones, ur: NEGATIVE mg/dL
Leukocytes,Ua: NEGATIVE
Nitrite: NEGATIVE
Specific Gravity, Urine: 1.021 (ref 1.005–1.030)
pH: 5.5 (ref 5.0–8.0)

## 2024-06-28 LAB — PREPARE RBC (CROSSMATCH)

## 2024-06-28 LAB — LIPASE, BLOOD: Lipase: 43 U/L (ref 11–51)

## 2024-06-28 MED ORDER — ACETAMINOPHEN 650 MG RE SUPP
650.0000 mg | Freq: Four times a day (QID) | RECTAL | Status: DC | PRN
Start: 2024-06-28 — End: 2024-07-05

## 2024-06-28 MED ORDER — ACETAMINOPHEN 325 MG PO TABS
650.0000 mg | ORAL_TABLET | Freq: Four times a day (QID) | ORAL | Status: DC | PRN
  Administered 2024-06-29 – 2024-07-03 (×5): 650 mg via ORAL
  Filled 2024-06-28 (×6): qty 2

## 2024-06-28 MED ORDER — SODIUM CHLORIDE 0.9 % IV SOLN: INTRAVENOUS | Status: AC

## 2024-06-28 MED ORDER — ONDANSETRON HCL 4 MG/2ML IJ SOLN: 4.0000 mg | Freq: Four times a day (QID) | INTRAMUSCULAR | Status: DC | PRN

## 2024-06-28 MED ORDER — POLYETHYLENE GLYCOL 3350 17 GM/SCOOP PO POWD
238.0000 g | Freq: Once | ORAL | Status: AC
  Administered 2024-06-28: 238 g via ORAL
  Filled 2024-06-28: qty 238

## 2024-06-28 MED ORDER — IOHEXOL 350 MG/ML SOLN
100.0000 mL | Freq: Once | INTRAVENOUS | Status: AC | PRN
  Administered 2024-06-28: 85 mL via INTRAVENOUS

## 2024-06-28 MED ORDER — SODIUM CHLORIDE 0.9 % IV BOLUS
1000.0000 mL | Freq: Once | INTRAVENOUS | Status: AC
  Administered 2024-06-28: 1000 mL via INTRAVENOUS

## 2024-06-28 MED ORDER — ONDANSETRON HCL 4 MG PO TABS: 4.0000 mg | ORAL_TABLET | Freq: Four times a day (QID) | ORAL | Status: DC | PRN

## 2024-06-28 MED ORDER — SIMETHICONE 80 MG PO CHEW
240.0000 mg | CHEWABLE_TABLET | Freq: Once | ORAL | Status: AC
  Administered 2024-06-28: 240 mg via ORAL
  Filled 2024-06-28: qty 3

## 2024-06-28 MED ORDER — SIMETHICONE 80 MG PO CHEW
240.0000 mg | CHEWABLE_TABLET | Freq: Once | ORAL | Status: DC
  Filled 2024-06-28: qty 3

## 2024-06-28 MED ORDER — SODIUM CHLORIDE 0.9 % IV BOLUS
1000.0000 mL | Freq: Once | INTRAVENOUS | Status: AC | PRN
Start: 2024-06-28 — End: ?

## 2024-06-28 MED ORDER — SODIUM CHLORIDE 0.9 % IV SOLN: INTRAVENOUS | Status: DC

## 2024-06-28 MED ORDER — INSULIN ASPART 100 UNIT/ML IJ SOLN
0.0000 [IU] | Freq: Three times a day (TID) | INTRAMUSCULAR | Status: DC
  Administered 2024-06-29: 8 [IU] via SUBCUTANEOUS
  Filled 2024-06-28: qty 8

## 2024-06-28 MED ORDER — SODIUM CHLORIDE 0.9% IV SOLUTION: Freq: Once | INTRAVENOUS | Status: DC

## 2024-06-28 MED ORDER — SODIUM CHLORIDE 0.9% IV SOLUTION: Freq: Once | INTRAVENOUS | Status: AC

## 2024-06-28 MED ORDER — PANTOPRAZOLE SODIUM 40 MG IV SOLR
40.0000 mg | Freq: Two times a day (BID) | INTRAVENOUS | Status: DC
  Administered 2024-06-28 – 2024-07-01 (×6): 40 mg via INTRAVENOUS
  Filled 2024-06-28 (×5): qty 10

## 2024-06-28 NOTE — ED Notes (Signed)
 Pt coming from [facility] bib [facility]. Second unit of RBC completed before patient left [facility] at 1526 completion time. Last vitals with [facility]: 134/79 bp, 88 HR, SpO2 100%, RR 22, 98.3 temp. GCS 15.

## 2024-06-28 NOTE — Significant Event (Addendum)
 Around 2300 found patient laying down on the ground. Staff assist called. Blood pressure 60s systolic, pt diaphoretic, with sluggish responsiveness. Emesis episode witnessed-pt turned to side. Rapid response called. Provider Franky made aware. No injury to patient. Pt had large bloody bowel movement. Per pt, After I pooped I became dizzy and laid down on the ground. I am not in pain and did not hit my head. Post fall assessment completed. Patient A/O x4. 1L oxygen for comfort. Labs drawn. 1 unit of pRBC given.

## 2024-06-28 NOTE — Plan of Care (Signed)
 Direct Admit  63 y/o male with lower GI bleed, possibly Diverticular. 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 5 episodes of dark bloody BMs today. BP 86/60. CTA abd/pelv negative- 2 U PRBC ordered. 1 dark bloody BM in ED at [facility]. Going to [facility] ED. [facility] paged and waiting on call. Hgb 11.2.

## 2024-06-28 NOTE — Significant Event (Signed)
 Patient had another episode of a large bloody bowel movement.  Systolic was in the 80s.  Ordering liter normal saline bolus and another unit of PRBC.  Repeat hemoglobin just about an hour ago was 12.  I discussed with Dr. Suzann on-call gastroenterologist and gastroenterologist requested repeat CT angio of the abdomen to see if there is any active bleed.  Redia Cleaver.

## 2024-06-28 NOTE — H&P (View-Only) (Signed)
 Consultation  Referring Provider: [facility] Primary Care Physician:  Theophilus Andrews, Tully GRADE, MD Primary Gastroenterologist:  Dr.Stark -previous  Reason for Consultation: Acute presumed lower GI hemorrhage  HPI: Christian Vance is a 63 y.o. male with history of diabetes mellitus, history of chronic inactive hepatitis B, patient also known to have diverticulosis, and colon polyps.  He presented to the emergency room early this morning after he had onset of abdominal rumbling, then passed a large amount of bright red blood in the commode.  He had 2 or 3 episodes at home prior to presenting to the emergency room at [facility].  He says he when he arrived to the ER he was feeling lightheaded and dizzy but did not have syncope. He had 2-3 more episodes of continued large-volume hematochezia while in the ER, he says this appears to be getting a bit darker but he cannot say that he saw any clots.  The last episode was perhaps 2-1/2 hours ago.  Labs in the ER this morning WBC 5.7/hemoglobin 11.2/hematocrit 33.5 (down from a baseline hemoglobin of 14.6 in January). Potassium 4.2/BUN 14/creatinine 0.68 LFTs normal Lipase within normal limits  CT angio was done which showed minimal diverticulosis and otherwise negative exam no evidence of active extravasation to suggest active bleeding.  Patient says he has been taking an aspirin  every day he is not sure of the dosage but his med list states acetaminophen .  He is not on any blood thinners.  He did have an admission in 2021 for an acute lower GI bleed and at that time had undergone EGD per Dr. 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  Path on the polyps consistent with hyperplastic  polyps.  He has not had any bleeding episodes since that time.  Patient was initially mildly hypotensive in the emergency room and [facility].  He was given 2 units of packed RBCs emergently, and IV fluids.  He has been hemodynamically stable since then currently blood pressure 120/70/pulse in the 90s.  He says he feels better after the transfusions, he is not having any lightheadedness or dizziness, no shortness of breath, no chest pain. He does not feel like he is still bleeding as he is not having any unsettled feeling in his abdomen..   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  followed by [facility] ID service.      Past Surgical History:  Procedure Laterality Date   BIOPSY  01/19/2020   Procedure: BIOPSY;  Surgeon: Aneita Gwendlyn DASEN, MD;  Location: [facility] ENDOSCOPY;  Service: Endoscopy;;   COLONOSCOPY WITH PROPOFOL  N/A 01/19/2020   Procedure: COLONOSCOPY WITH PROPOFOL ;  Surgeon: Aneita Gwendlyn DASEN, MD;  Location: [facility] ENDOSCOPY;  Service: Endoscopy;  Laterality: N/A;   ESOPHAGOGASTRODUODENOSCOPY (EGD) WITH PROPOFOL  N/A 01/19/2020   Procedure: ESOPHAGOGASTRODUODENOSCOPY (EGD) WITH PROPOFOL ;  Surgeon: Aneita Gwendlyn DASEN, MD;  Location: [facility] ENDOSCOPY;  Service: Endoscopy;  Laterality: N/A;   HOT HEMOSTASIS N/A 01/19/2020   Procedure: HOT HEMOSTASIS (ARGON PLASMA COAGULATION/BICAP);  Surgeon: Aneita Gwendlyn DASEN, MD;  Location: [facility] ENDOSCOPY;  Service: Endoscopy;  Laterality: N/A;   ORIF ANKLE FRACTURE  Right 11/13/2021   Procedure: OPEN REDUCTION INTERNAL FIXATION (ORIF) ANKLE FRACTURE;  Surgeon: Kendal Franky SQUIBB, MD;  Location: [facility] OR;  Service: Orthopedics;  Laterality: Right;   ORIF CLAVICULAR FRACTURE Left 11/13/2021   Procedure: OPEN REDUCTION INTERNAL FIXATION (ORIF) CLAVICULAR FRACTURE;  Surgeon: Kendal Franky SQUIBB, MD;  Location: [facility] OR;  Service: Orthopedics;  Laterality: Left;   POLYPECTOMY   01/19/2020   Procedure: POLYPECTOMY;  Surgeon: Aneita Gwendlyn DASEN, MD;  Location: [facility] ENDOSCOPY;  Service: Endoscopy;;    Prior to Admission medications   Medication Sig Start Date End Date Taking? Authorizing Provider  ACCU-CHEK GUIDE TEST test strip 1 EACH BY IN VITRO ROUTE IN THE MORNING, AT NOON, AND AT BEDTIME. MAY SUBSTITUTE TO ANY MANUFACTURER COVERED BY PATIENT'S INSURANCE. 01/16/24   Theophilus Andrews, Tully GRADE, MD  Accu-Chek Softclix Lancets lancets USE AS DIRECTED 4 TIMES A DAY 06/17/21   Theophilus Andrews, Tully GRADE, MD  acetaminophen  (TYLENOL ) 650 MG CR tablet Take 650 mg by mouth every 8 (eight) hours as needed for pain.    [provider]  Blood Glucose Monitoring Suppl DEVI 1 each by Does not apply route in the morning, at noon, and at bedtime. May substitute to any manufacturer covered by patient's insurance. 12/13/23   Theophilus Andrews, Tully GRADE, MD  cetirizine  (ZYRTEC ) 10 MG tablet Take 1 tablet (10 mg total) by mouth daily. 01/22/20   Theophilus Andrews, Tully GRADE, MD  JARDIANCE  10 MG TABS tablet TAKE 1 TABLET BY MOUTH DAILY BEFORE BREAKFAST. 06/12/24   Theophilus Andrews, Tully GRADE, MD  methocarbamol  (ROBAXIN ) 500 MG tablet Take 1 tablet (500 mg total) by mouth every 6 (six) hours as needed for muscle spasms. 11/16/21   Danton Lauraine LABOR, PA-C  Multiple Vitamin (MULTIVITAMIN WITH MINERALS) TABS tablet Take 1 tablet by mouth daily. Centrum    [provider]  Oxycodone  HCl 10 MG TABS Take 0.5-1 tablets (5-10 mg total) by mouth every 4 (four) hours as needed (5mg  for moderate pain, 10mg  for severe pain). 11/16/21   Danton Lauraine LABOR, PA-C  polyethylene glycol (MIRALAX  / GLYCOLAX ) 17 g packet Take 17 g by mouth daily as needed for mild constipation. 11/16/21   Danton Lauraine LABOR, PA-C  Semaglutide ,0.25 or 0.5MG /DOS, (OZEMPIC , 0.25 OR 0.5 MG/DOSE,) 2 MG/3ML SOPN Inject 0.25 mg once a week for the first 4 weeks and then increase to 0.5 mg weekly. 03/21/24   Theophilus Andrews, Tully GRADE, MD   sildenafil  (VIAGRA ) 100 MG tablet Take 0.5-1 tablets (50-100 mg total) by mouth daily as needed for erectile dysfunction. 02/25/21   Theophilus Andrews, Tully GRADE, MD    Current Facility-Administered Medications  Medication Dose Route Frequency Provider Last Rate Last Admin   0.9 %  sodium chloride  infusion (Manually program via Guardrails IV Fluids)   Intravenous Once Mannie Pac T, DO       pantoprazole  (PROTONIX ) injection 40 mg  40 mg Intravenous Q12H Esterwood, Amy S, PA-C        Allergies as of 06/28/2024   (No Known Allergies)    Family History  Problem Relation Age of Onset   Colon cancer Neg Hx     Social History   Socioeconomic History   Marital status: Married    Spouse name: [name]   Number of children: Not on file   Years of education: Not on file   Highest education level: Doctorate  Occupational History   Occupation: [employer] driver  Tobacco Use   Smoking status: Never  Smokeless tobacco: Never  Vaping Use   Vaping status: Never Used  Substance and Sexual Activity   Alcohol use: No   Drug use: No   Sexual activity: Not on file  Other Topics Concern   Not on file  Social History Narrative   Work or School: self employed, transportation      Home Situation: lives with wife      Spiritual Beliefs:      Lifestyle: no regular exercise; diet is healthy      Patient is right handed. He lives with his wife in a 1st floor apartment. He drinks 2-3 cups of coffee a day, and ocassionally tea. He does not exercise.         Social Drivers of Corporate Investment Banker Strain: Not on file  Food Insecurity: Not on file  Transportation Needs: Not on file  Physical Activity: Not on file  Stress: Not on file  Social Connections: Not on file  Intimate Partner Violence: Not on file    Review of Systems: Pertinent positive and negative review of systems were noted in the above HPI section.  All other review of systems was otherwise negative.   Physical  Exam: Vital signs in last 24 hours: Temp:  [97.4 F (36.3 C)-98.4 F (36.9 C)] 97.8 F (36.6 C) (11/20 1609) Pulse Rate:  [65-95] 95 (11/20 1609) Resp:  [14-18] 16 (11/20 1609) BP: (82-119)/(58-76) 119/73 (11/20 1630) SpO2:  [96 %-100 %] 100 % (11/20 1609) Last BM Date : 06/28/24 General:   Alert,  Well-developed, well-nourished, older male pleasant and cooperative in NAD Head:  Normocephalic and atraumatic. Eyes:  Sclera clear, no icterus.   Conjunctiva pink. Ears:  Normal auditory acuity. Nose:  No deformity, discharge,  or lesions. Mouth:  No deformity or lesions.   Neck:  Supple; no masses or thyromegaly. Lungs:  Clear throughout to auscultation.   No wheezes, crackles, or rhonchi.  Heart:  Regular rate and rhythm; no murmurs, clicks, rubs,  or gallops. Abdomen:  Soft,nontender, BS active,nonpalp mass or hsm.   Rectal: Not done, documented grossly bloody stool in the ER Msk:  Symmetrical without gross deformities. . Pulses:  Normal pulses noted. Extremities:  Without clubbing or edema. Neurologic:  Alert and  oriented x4;  grossly normal neurologically. Skin:  Intact without significant lesions or rashes.. Psych:  Alert and cooperative. Normal mood and affect.  Intake/Output from previous day: No intake/output data recorded. Intake/Output this shift: Total I/O In: 1000.5 [IV Piggyback:1000.5] Out: -   Lab Results: Recent Labs    06/28/24 1151  WBC 5.7  HGB 11.2*  HCT 33.5*  PLT 129*   BMET Recent Labs    06/28/24 1320  NA 134*  K 4.2  CL 105  CO2 21*  GLUCOSE 227*  BUN 14  CREATININE 0.68  CALCIUM  7.7*   LFT Recent Labs    06/28/24 1320  PROT 5.1*  ALBUMIN  3.0*  AST 16  ALT 15  ALKPHOS 64  BILITOT 0.3   PT/INR No results for input(s): LABPROT, INR in the last 72 hours. Hepatitis Panel No results for input(s): HEPBSAG, HCVAB, HEPAIGM, HEPBIGM in the last 72 hours.   IMPRESSION:  #95 63 year old male admitted after onset early  in the morning hours today with painless grossly bloody stools.  Patient had 2 or 3 episodes at home, presented to the emergency room at [facility] where he was lightheaded and dizzy and mildly hypotensive.  Initial hemoglobin 11.2  Patient had 2-3  more episodes of large-volume bloody stool while in the emergency room  He received 2 units of packed RBCs emergently  CTA was done which did not show any extravasation to suggest active bleeding, was noted to have minimal diverticulosis and otherwise unremarkable study.  Suspect this is a second acute diverticular hemorrhage for this patient, he did have an admission in 2021 with similar bleeding and had EGD and colonoscopy at that time as outlined which showed a few diverticuli scattered in the colon and he also had a nonbleeding cecal AVM treated with APC, as well as removal of small polyps.  Also need to consider other source for acute lower GI bleed i.e. AVM or other mucosal ulceration  #2 anemia acute secondary to acute GI blood loss #3 diabetes mellitus-currently on Ozempic  4.  Chronic inactive hepatitis B  Plan; clear liquids, n.p.o. after midnight Stat hemoglobin now, transfuse to keep his hemoglobin above 8, trend hemoglobin every 6 hours Cover with IV PPI for now We will plan MiraLAX  Gatorade prep this evening, to clear his bowel, and schedule for colonoscopy tomorrow a.m. with Dr. San.  Plans were discussed with the patient this evening including indications risks and benefits and he is agreeable to proceed.  If he develops active major bleed this evening with hemodynamic instability or significant drop in hemoglobin he would need to go for repeat urgent CTA, and notify IR for potential emergent angiography and embolization   GI will follow with you.     Amy EsterwoodPA-C  06/28/2024, 5:05 PM     Attending physician's note   I have taken a history, reviewed the chart, and examined the patien alongside Amy Esterwood,  PA-C.  I performed a substantive portion of this encounter, including complete performance of at least one of the key components, in conjunction with the APP. I agree with the APP's note, impression, and recommendations with my edits.   [id number], DO, FACG [phone] office

## 2024-06-28 NOTE — H&P (Deleted)
 [facility] History and Physical  DEIVI HUCKINS FMW:[id number] DOB: August 20, 1960 DOA: 06/28/2024   PCP: Theophilus Andrews, Tully GRADE, MD  Specialists: [facility]  Chief Complaint: Bloody stools  HPI: Christian Vance is a 63 y.o. male with past medical history of diabetes mellitus type 2, previous history of GI bleed in 2021 status post colonoscopy in 2021 which revealed diverticulosis, AVM and polyps.  Patient was in his usual state of health till earlier this morning when he woke up at around 4:30 AM and went to have a bowel movement which was dark and bloody  He had about 5 more bowel movements.  He felt dizzy and lightheaded and weak.  His wife decided to bring him to the emergency department.  Initially he was hypotensive.  He was given IV fluids and then transfused 2 units of PRBC.  His blood pressure recovered.  He started feeling better.  He had 2 more bloody bowel movements in the emergency department.  He denies any abdominal pain.  No nausea or vomiting.  Takes only aspirin  once a day which he took yesterday.  No other blood thinners or anticoagulants.  Last bowel movement was about 2 hours ago.  In the emergency department he was initially hypotensive.  His milligram was 11.2.  Platelet counts 129.  Underwent CT angiogram of the abdomen pelvis which did not show any active bleeding.  Home Medications: Prior to Admission medications   Medication Sig Start Date End Date Taking? Authorizing Provider  ACCU-CHEK GUIDE TEST test strip 1 EACH BY IN VITRO ROUTE IN THE MORNING, AT NOON, AND AT BEDTIME. MAY SUBSTITUTE TO ANY MANUFACTURER COVERED BY PATIENT'S INSURANCE. 01/16/24   Theophilus Andrews, Tully GRADE, MD  Accu-Chek Softclix Lancets lancets USE AS DIRECTED 4 TIMES A DAY 06/17/21   Theophilus Andrews, Tully GRADE, MD  acetaminophen  (TYLENOL ) 650 MG CR tablet Take 650 mg by mouth every 8 (eight) hours as needed for pain.    [provider]  Blood Glucose Monitoring Suppl DEVI 1  each by Does not apply route in the morning, at noon, and at bedtime. May substitute to any manufacturer covered by patient's insurance. 12/13/23   Theophilus Andrews, Tully GRADE, MD  cetirizine  (ZYRTEC ) 10 MG tablet Take 1 tablet (10 mg total) by mouth daily. 01/22/20   Theophilus Andrews Tully GRADE, MD  JARDIANCE  10 MG TABS tablet TAKE 1 TABLET BY MOUTH DAILY BEFORE BREAKFAST. 06/12/24   Theophilus Andrews, Tully GRADE, MD  methocarbamol  (ROBAXIN ) 500 MG tablet Take 1 tablet (500 mg total) by mouth every 6 (six) hours as needed for muscle spasms. 11/16/21   Danton Lauraine LABOR, PA-C  Multiple Vitamin (MULTIVITAMIN WITH MINERALS) TABS tablet Take 1 tablet by mouth daily. Centrum    [provider]  Oxycodone  HCl 10 MG TABS Take 0.5-1 tablets (5-10 mg total) by mouth every 4 (four) hours as needed (5mg  for moderate pain, 10mg  for severe pain). 11/16/21   Danton Lauraine LABOR, PA-C  polyethylene glycol (MIRALAX  / GLYCOLAX ) 17 g packet Take 17 g by mouth daily as needed for mild constipation. 11/16/21   Danton Lauraine LABOR, PA-C  Semaglutide ,0.25 or 0.5MG /DOS, (OZEMPIC , 0.25 OR 0.5 MG/DOSE,) 2 MG/3ML SOPN Inject 0.25 mg once a week for the first 4 weeks and then increase to 0.5 mg weekly. 03/21/24   Theophilus Andrews, Tully GRADE, MD  sildenafil  (VIAGRA ) 100 MG tablet Take 0.5-1 tablets (50-100 mg total) by mouth daily as needed for erectile dysfunction. 02/25/21   Theophilus Andrews,  Tully GRADE, MD    Allergies: No Known Allergies  Past Medical History: Past Medical History:  Diagnosis Date   Allergy    Diabetes mellitus    Hepatitis B infection without delta agent without hepatic coma    chronic, inactive.  followed by [facility] ID service.      Past Surgical History:  Procedure Laterality Date   BIOPSY  01/19/2020   Procedure: BIOPSY;  Surgeon: Aneita Gwendlyn DASEN, MD;  Location: [facility] ENDOSCOPY;  Service: Endoscopy;;   COLONOSCOPY WITH PROPOFOL  N/A 01/19/2020   Procedure: COLONOSCOPY WITH PROPOFOL ;  Surgeon: Aneita Gwendlyn DASEN, MD;   Location: [facility] ENDOSCOPY;  Service: Endoscopy;  Laterality: N/A;   ESOPHAGOGASTRODUODENOSCOPY (EGD) WITH PROPOFOL  N/A 01/19/2020   Procedure: ESOPHAGOGASTRODUODENOSCOPY (EGD) WITH PROPOFOL ;  Surgeon: Aneita Gwendlyn DASEN, MD;  Location: [facility] ENDOSCOPY;  Service: Endoscopy;  Laterality: N/A;   HOT HEMOSTASIS N/A 01/19/2020   Procedure: HOT HEMOSTASIS (ARGON PLASMA COAGULATION/BICAP);  Surgeon: Aneita Gwendlyn DASEN, MD;  Location: [facility] ENDOSCOPY;  Service: Endoscopy;  Laterality: N/A;   ORIF ANKLE FRACTURE Right 11/13/2021   Procedure: OPEN REDUCTION INTERNAL FIXATION (ORIF) ANKLE FRACTURE;  Surgeon: Kendal Franky SQUIBB, MD;  Location: [facility] OR;  Service: Orthopedics;  Laterality: Right;   ORIF CLAVICULAR FRACTURE Left 11/13/2021   Procedure: OPEN REDUCTION INTERNAL FIXATION (ORIF) CLAVICULAR FRACTURE;  Surgeon: Kendal Franky SQUIBB, MD;  Location: [facility] OR;  Service: Orthopedics;  Laterality: Left;   POLYPECTOMY  01/19/2020   Procedure: POLYPECTOMY;  Surgeon: Aneita Gwendlyn DASEN, MD;  Location: [facility] ENDOSCOPY;  Service: Endoscopy;;    Social History: Lives with his wife.  No smoking alcohol use or recreational drug use.  Usually independent with daily activities.   Family History:  Family History  Problem Relation Age of Onset   Colon cancer Neg Hx      Review of Systems - History obtained from the patient General ROS: positive for  - fatigue Psychological ROS: negative Ophthalmic ROS: negative ENT ROS: negative Allergy and Immunology ROS: negative Hematological and Lymphatic ROS: negative Endocrine ROS: negative Respiratory ROS: no cough, shortness of breath, or wheezing Cardiovascular ROS: no chest pain or dyspnea on exertion Gastrointestinal ROS: As in HPI Genito-Urinary ROS: no dysuria, trouble voiding, or hematuria Musculoskeletal ROS: negative Neurological ROS: no TIA or stroke symptoms Dermatological ROS: negative  Physical Examination  Vitals:   06/28/24 1452 06/28/24 1513 06/28/24 1609 06/28/24 1630  BP:  112/73 111/76 98/62 119/73  Pulse: 77 77 95   Resp: 16 16 16    Temp: (!) 97.4 F (36.3 C) 98.4 F (36.9 C) 97.8 F (36.6 C)   TempSrc: Oral Oral Oral   SpO2: 100% 100% 100%     BP 119/73 (BP Location: Left Arm)   Pulse 95   Temp 97.8 F (36.6 C) (Oral)   Resp 16   SpO2 100%   General appearance: alert, cooperative, appears stated age, and no distress Head: Normocephalic, without obvious abnormality, atraumatic Eyes: conjunctivae/corneas clear. PERRL, EOM's intact.  Throat: lips, mucosa, and tongue normal; teeth and gums normal Neck: no adenopathy, no carotid bruit, no JVD, supple, symmetrical, trachea midline, and thyroid  not enlarged, symmetric, no tenderness/mass/nodules Resp: clear to auscultation bilaterally Cardio: regular rate and rhythm, S1, S2 normal, no murmur, click, rub or gallop GI: soft, non-tender; bowel sounds normal; no masses,  no organomegaly Extremities: extremities normal, atraumatic, no cyanosis or edema Pulses: 2+ and symmetric Skin: Skin color, texture, turgor normal. No rashes or lesions Lymph nodes: Cervical, supraclavicular, and axillary nodes normal. Neurologic:  Alert and oriented x 3.  Cranial nerves II to XII intact.  Motor strength equal bilateral upper and lower extremity.  Gait not assessed.   Labs on Admission: I have personally reviewed following labs and imaging studies  CBC: Recent Labs  Lab 06/28/24 1151  WBC 5.7  HGB 11.2*  HCT 33.5*  MCV 96.8  PLT 129*   Basic Metabolic Panel: Recent Labs  Lab 06/28/24 1320  NA 134*  K 4.2  CL 105  CO2 21*  GLUCOSE 227*  BUN 14  CREATININE 0.68  CALCIUM 7.7*   GFR: CrCl cannot be calculated (Unknown ideal weight.). Liver Function Tests: Recent Labs  Lab 06/28/24 1320  AST 16  ALT 15  ALKPHOS 64  BILITOT 0.3  PROT 5.1*  ALBUMIN 3.0*   Recent Labs  Lab 06/28/24 1320  LIPASE 43    CBG: Recent Labs  Lab 06/28/24 1732  GLUCAP 181*     Radiological Exams on  Admission: CT Angio Abd/Pel W and/or Wo Contrast Result Date: 06/28/2024 CLINICAL DATA:  Possible lower GI bleed. Dark red blood in stool since this morning. Five episodes. Dizziness. EXAM: CTA ABDOMEN AND PELVIS WITHOUT AND WITH CONTRAST TECHNIQUE: Multidetector CT imaging of the abdomen and pelvis was performed using the standard protocol during bolus administration of intravenous contrast. Multiplanar reconstructed images and MIPs were obtained and reviewed to evaluate the vascular anatomy. RADIATION DOSE REDUCTION: This exam was performed according to the departmental dose-optimization program which includes automated exposure control, adjustment of the mA and/or kV according to patient size and/or use of iterative reconstruction technique. CONTRAST:  85mL OMNIPAQUE  IOHEXOL  350 MG/ML SOLN COMPARISON:  11/11/2021 FINDINGS: VASCULAR Aorta: Normal caliber aorta without aneurysm, dissection, vasculitis or significant stenosis. Celiac: Patent without evidence of aneurysm, dissection, vasculitis or significant stenosis. SMA: Patent without evidence of aneurysm, dissection, vasculitis or significant stenosis. Renals: Both renal arteries are patent without evidence of aneurysm, dissection, vasculitis, fibromuscular dysplasia or significant stenosis. IMA: Patent without evidence of aneurysm, dissection, vasculitis or significant stenosis. Inflow: Patent without evidence of aneurysm, dissection, vasculitis or significant stenosis. Proximal Outflow: Bilateral common femoral and visualized portions of the superficial and profunda femoral arteries are patent without evidence of aneurysm, dissection, vasculitis or significant stenosis. Veins: No obvious venous abnormality within the limitations of this arterial phase study. Review of the MIP images confirms the above findings. NON-VASCULAR Lower chest: Heart is normal size.  Visualized lung bases are clear. Hepatobiliary: Liver, gallbladder and biliary tree are normal.  Pancreas: Normal. Spleen: Normal. Adrenals/Urinary Tract: Adrenal glands are normal. Kidneys are normal in size without hydronephrosis or nephrolithiasis. Kidneys are otherwise unchanged. Ureters and bladder are normal. Stomach/Bowel: Stomach and small bowel are normal. Appendix is normal. Minimal diverticulosis of the colon. Lymphatic: No adenopathy. Reproductive: Prostate is unremarkable. Other: No significant free peritoneal fluid or focal inflammatory change. Musculoskeletal: Minimal loss of mid vertebral body height of T10 with small associated Schmorl's node new since the prior exam. IMPRESSION: 1. No acute findings in the abdomen/pelvis. No evidence of active GI bleed. 2. Minimal colonic diverticulosis. Electronically Signed   By: Toribio Agreste M.D.   On: 06/28/2024 13:59     Problem List  Principal Problem:   GI bleed Active Problems:   DM (diabetes mellitus), type 2 (HCC)   Hepatitis B, chronic (HCC)   Assessment: This is a 63 year old male with past medical history as stated earlier who comes in with GI bleed.  Most likely lower GI bleed.  Patient with history  of polyps, AVM, diverticulosis.  This is painless bleeding and most likely to be diverticular in origin.  Last colonoscopy was in 2021.  Plan:  #1. Lower GI bleed: Patient was initially hypotensive.  He was transfused 2 units of PRBC with improvement in his blood pressure.  Gastroenterology has been notified.  Monitor for signs of bleeding.  CT angiogram was negative for active bleed.  Will place on PPI for now.  As mentioned above his last colonoscopy was in 2021 which revealed AVM, polyps, diverticulosis.  Hyperplastic polyps noted on pathology.  Upper endoscopy at that time revealed gastritis without any evidence of active bleeding.  No H. pylori detected.  #2.  Acute blood loss anemia: He has been transfused 2 units of PRBC due to unstable hemodynamics at presentation.  Will recheck his hemoglobin now and monitor every 6 hours  for now.  #3.  Diabetes mellitus type 2: Monitor CBGs.  Hold his home medications for now  #4.  Chronic hepatitis B: Outpatient management.  Liver noted to be normal on CT scan.  #5. Hyponatremia: IV fluids.  Recheck labs in the morning.  #6.  Thrombocytopenia: Mild thrombocytopenia noted.  Low compared to previous values.  Will recheck labs in the morning.   DVT Prophylaxis: SCDs Code Status: Full code Family Communication: Discussed with patient Disposition: Hopefully return home when improved Consults called: [facility] Admission Status: Status is: Observation The patient remains OBS appropriate and will d/c before 2 midnights.    Severity of Illness: The appropriate patient status for this patient is OBSERVATION. Observation status is judged to be reasonable and necessary in order to provide the required intensity of service to ensure the patient's safety. The patient's presenting symptoms, physical exam findings, and initial radiographic and laboratory data in the context of their medical condition is felt to place them at decreased risk for further clinical deterioration. Furthermore, it is anticipated that the patient will be medically stable for discharge from the hospital within 2 midnights of admission.    Further management decisions will depend on results of further testing and patient's response to treatment.   Loron Weimer  [facility] Pager on [email or web address]  06/28/2024, 5:40 PM

## 2024-06-28 NOTE — ED Notes (Signed)
 5W notified of patient transport to unit. Pt transported to floor by ED paramedic.

## 2024-06-28 NOTE — ED Provider Notes (Addendum)
 [place] EMERGENCY DEPARTMENT AT [facility] Provider Note   CSN: [id number] Arrival date & time: 06/28/24  1136     Patient presents with: Rectal Bleeding   Christian Vance is a 63 y.o. male.   This is a 63 year old male who is here today with blood in his stool.  Episodes began this morning.  He endorses dark red blood in his stool.  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  He is not on any blood thinners.  He is here today with wife helps provide history.   Rectal Bleeding      Prior to Admission medications   Medication Sig Start Date End Date Taking? Authorizing Provider  ACCU-CHEK GUIDE TEST test strip 1 EACH BY IN VITRO ROUTE IN THE MORNING, AT NOON, AND AT BEDTIME. MAY SUBSTITUTE TO ANY MANUFACTURER COVERED BY PATIENT'S INSURANCE. 01/16/24   Theophilus Andrews, Tully GRADE, MD  Accu-Chek Softclix Lancets lancets USE AS DIRECTED 4 TIMES A DAY 06/17/21   Theophilus Andrews, Tully GRADE, MD  acetaminophen  (TYLENOL ) 650 MG CR tablet Take 650 mg by mouth every 8 (eight) hours as needed for pain.    [provider]  Blood Glucose Monitoring Suppl DEVI 1 each by Does not apply route in the morning, at noon, and at bedtime. May substitute to any manufacturer covered by patient's insurance. 12/13/23   Theophilus Andrews, Tully GRADE, MD  cetirizine  (ZYRTEC ) 10 MG tablet Take 1 tablet (10 mg total) by mouth daily. 01/22/20   Theophilus Andrews, Tully GRADE, MD  JARDIANCE  10 MG TABS tablet TAKE 1 TABLET BY MOUTH DAILY BEFORE BREAKFAST. 06/12/24   Theophilus Andrews, Tully GRADE, MD  methocarbamol  (ROBAXIN ) 500 MG tablet Take 1 tablet (500 mg total) by mouth every 6 (six) hours as needed for muscle spasms. 11/16/21   Danton Lauraine LABOR, PA-C  Multiple Vitamin (MULTIVITAMIN WITH MINERALS) TABS tablet Take 1 tablet by mouth daily. Centrum    [provider]  Oxycodone  HCl 10 MG TABS Take 0.5-1 tablets (5-10 mg total) by mouth every 4 (four) hours as needed (5mg  for moderate pain,  10mg  for severe pain). 11/16/21   Danton Lauraine LABOR, PA-C  polyethylene glycol (MIRALAX  / GLYCOLAX ) 17 g packet Take 17 g by mouth daily as needed for mild constipation. 11/16/21   Danton Lauraine LABOR, PA-C  Semaglutide ,0.25 or 0.5MG /DOS, (OZEMPIC , 0.25 OR 0.5 MG/DOSE,) 2 MG/3ML SOPN Inject 0.25 mg once a week for the first 4 weeks and then increase to 0.5 mg weekly. 03/21/24   Theophilus Andrews, Tully GRADE, MD  sildenafil  (VIAGRA ) 100 MG tablet Take 0.5-1 tablets (50-100 mg total) by mouth daily as needed for erectile dysfunction. 02/25/21   Theophilus Andrews, Tully GRADE, MD    Allergies: Patient has no known allergies.    Review of Systems  Gastrointestinal:  Positive for hematochezia.    Updated Vital Signs BP 110/69   Pulse 76   Temp 98.3 F (36.8 C)   Resp 16   SpO2 100%   Physical Exam Vitals and nursing note reviewed. Exam conducted with a chaperone present.  HENT:     Head: Normocephalic and atraumatic.  Eyes:     Pupils: Pupils are equal, round, and reactive to light.  Cardiovascular:     Rate and Rhythm: Normal rate.     Pulses: Normal pulses.  Pulmonary:     Effort: Pulmonary effort is normal.  Abdominal:     General: Abdomen is flat.  Genitourinary:  Comments: Dark blood on digital rectal exam Skin:    General: Skin is warm.  Neurological:     General: No focal deficit present.     Mental Status: He is alert.     (all labs ordered are listed, but only abnormal results are displayed) Labs Reviewed  CBC - Abnormal; Notable for the following components:      Result Value   RBC 3.46 (*)    Hemoglobin 11.2 (*)    HCT 33.5 (*)    Platelets 129 (*)    All other components within normal limits  URINALYSIS, ROUTINE W REFLEX MICROSCOPIC - Abnormal; Notable for the following components:   Glucose, UA 500 (*)    Protein, ur TRACE (*)    Bacteria, UA RARE (*)    All other components within normal limits  COMPREHENSIVE METABOLIC PANEL WITH GFR - Abnormal; Notable for the  following components:   Sodium 134 (*)    CO2 21 (*)    Glucose, Bld 227 (*)    Calcium 7.7 (*)    Total Protein 5.1 (*)    Albumin 3.0 (*)    All other components within normal limits  LIPASE, BLOOD  PREPARE RBC (CROSSMATCH)    EKG: None  Radiology: CT Angio Abd/Pel W and/or Wo Contrast Result Date: 06/28/2024 CLINICAL DATA:  Possible lower GI bleed. Dark red blood in stool since this morning. Five episodes. Dizziness. EXAM: CTA ABDOMEN AND PELVIS WITHOUT AND WITH CONTRAST TECHNIQUE: Multidetector CT imaging of the abdomen and pelvis was performed using the standard protocol during bolus administration of intravenous contrast. Multiplanar reconstructed images and MIPs were obtained and reviewed to evaluate the vascular anatomy. RADIATION DOSE REDUCTION: This exam was performed according to the departmental dose-optimization program which includes automated exposure control, adjustment of the mA and/or kV according to patient size and/or use of iterative reconstruction technique. CONTRAST:  85mL OMNIPAQUE  IOHEXOL  350 MG/ML SOLN COMPARISON:  11/11/2021 FINDINGS: VASCULAR Aorta: Normal caliber aorta without aneurysm, dissection, vasculitis or significant stenosis. Celiac: Patent without evidence of aneurysm, dissection, vasculitis or significant stenosis. SMA: Patent without evidence of aneurysm, dissection, vasculitis or significant stenosis. Renals: Both renal arteries are patent without evidence of aneurysm, dissection, vasculitis, fibromuscular dysplasia or significant stenosis. IMA: Patent without evidence of aneurysm, dissection, vasculitis or significant stenosis. Inflow: Patent without evidence of aneurysm, dissection, vasculitis or significant stenosis. Proximal Outflow: Bilateral common femoral and visualized portions of the superficial and profunda femoral arteries are patent without evidence of aneurysm, dissection, vasculitis or significant stenosis. Veins: No obvious venous abnormality  within the limitations of this arterial phase study. Review of the MIP images confirms the above findings. NON-VASCULAR Lower chest: Heart is normal size.  Visualized lung bases are clear. Hepatobiliary: Liver, gallbladder and biliary tree are normal. Pancreas: Normal. Spleen: Normal. Adrenals/Urinary Tract: Adrenal glands are normal. Kidneys are normal in size without hydronephrosis or nephrolithiasis. Kidneys are otherwise unchanged. Ureters and bladder are normal. Stomach/Bowel: Stomach and small bowel are normal. Appendix is normal. Minimal diverticulosis of the colon. Lymphatic: No adenopathy. Reproductive: Prostate is unremarkable. Other: No significant free peritoneal fluid or focal inflammatory change. Musculoskeletal: Minimal loss of mid vertebral body height of T10 with small associated Schmorl's node new since the prior exam. IMPRESSION: 1. No acute findings in the abdomen/pelvis. No evidence of active GI bleed. 2. Minimal colonic diverticulosis. Electronically Signed   By: Toribio Agreste M.D.   On: 06/28/2024 13:59     .Critical Care  Performed by: Mannie Pac  T, DO Authorized by: Mannie Fairy DASEN, DO   Critical care provider statement:    Critical care time (minutes):  77   Critical care was necessary to treat or prevent imminent or life-threatening deterioration of the following conditions:  Shock   Critical care was time spent personally by me on the following activities:  Development of treatment plan with patient or surrogate, discussions with consultants, evaluation of patient's response to treatment, examination of patient, ordering and review of laboratory studies, ordering and review of radiographic studies, ordering and performing treatments and interventions, pulse oximetry, re-evaluation of patient's condition and review of old charts    Medications Ordered in the ED  0.9 %  sodium chloride  infusion (Manually program via Guardrails IV Fluids) (has no administration in time  range)  sodium chloride  0.9 % bolus 1,000 mL ( Intravenous Stopped 06/28/24 1314)  iohexol  (OMNIPAQUE ) 350 MG/ML injection 100 mL (85 mLs Intravenous Contrast Given 06/28/24 1327)                                    Medical Decision Making 63 year old male here today with dark blood in stools.  Differential diagnoses include lower GI bleed, diverticulosis, lower GI AVM.  Plan-patient with soft blood pressure on arrival to the emergency room, nontachycardic, blood pressure did not proved to a MAP greater than 75 without intervention.  Will provide him some IV fluids.  Will obtain CTA of the patient's abdomen and pelvis.  Anticipate patient likely requiring blood transfusion.  Unfortunately at her freestanding ED we only have 2 units of blood.  If patient has repeated episode of hypotension, will aggressively transfuse and emergently transfer to our tertiary centers.  Reassessment 2:30 PM-patient CTA fortunately does not show any brisk or active bleeding.  Patient did have another bloody bowel movement here in the emergency room.  Did have another soft blood pressure, so I decided to transfuse the patient as I believe his hemoglobin is likely continuing to drop.  Fortunately blood pressure has rebounded, as patient is compensating well.  Will transfer the patient to the [facility] emergency room.  Dr. Garrick is accepting.  Have reached out to [facility] as the patient has seen them previously.  Patient is stable at this time and ready for transfer.  Spoke with PA Amy Esterwood from GI about the patient.  Amount and/or Complexity of Data Reviewed Labs: ordered. Radiology: ordered.  Risk Prescription drug management. Decision regarding hospitalization.        Final diagnoses:  Acute GI bleeding    ED Discharge Orders     None          Mannie Fairy DASEN, DO 06/28/24 1431    Mannie Fairy T, DO 06/28/24 1513

## 2024-06-28 NOTE — H&P (Signed)
 [facility] History and Physical  Christian Vance FMW:[id number] DOB: 1961/03/19 DOA: 06/28/2024   PCP: Theophilus Andrews, Tully GRADE, MD  Specialists: [facility]  Chief Complaint: Bloody stools  HPI: Christian Vance is a 63 y.o. male with past medical history of diabetes mellitus type 2, previous history of GI bleed in 2021 status post colonoscopy in 2021 which revealed diverticulosis, AVM and polyps.  Patient was in his usual state of health till earlier this morning when he woke up at around 4:30 AM and went to have a bowel movement which was dark and bloody  He had about 5 more bowel movements.  He felt dizzy and lightheaded and weak.  His wife decided to bring him to the emergency department.  Initially he was hypotensive.  He was given IV fluids and then transfused 2 units of PRBC.  His blood pressure recovered.  He started feeling better.  He had 2 more bloody bowel movements in the emergency department.  He denies any abdominal pain.  No nausea or vomiting.  Takes only aspirin  once a day which he took yesterday.  No other blood thinners or anticoagulants.  Last bowel movement was about 2 hours ago.  In the emergency department he was initially hypotensive.  His milligram was 11.2.  Platelet counts 129.  Underwent CT angiogram of the abdomen pelvis which did not show any active bleeding.  Home Medications: Prior to Admission medications   Medication Sig Start Date End Date Taking? Authorizing Provider  ACCU-CHEK GUIDE TEST test strip 1 EACH BY IN VITRO ROUTE IN THE MORNING, AT NOON, AND AT BEDTIME. MAY SUBSTITUTE TO ANY MANUFACTURER COVERED BY PATIENT'S INSURANCE. 01/16/24   Theophilus Andrews, Tully GRADE, MD  Accu-Chek Softclix Lancets lancets USE AS DIRECTED 4 TIMES A DAY 06/17/21   Theophilus Andrews, Tully GRADE, MD  acetaminophen  (TYLENOL ) 650 MG CR tablet Take 650 mg by mouth every 8 (eight) hours as needed for pain.    [provider]  Blood Glucose Monitoring Suppl DEVI 1  each by Does not apply route in the morning, at noon, and at bedtime. May substitute to any manufacturer covered by patient's insurance. 12/13/23   Theophilus Andrews, Tully GRADE, MD  cetirizine  (ZYRTEC ) 10 MG tablet Take 1 tablet (10 mg total) by mouth daily. 01/22/20   Theophilus Andrews Tully GRADE, MD  JARDIANCE  10 MG TABS tablet TAKE 1 TABLET BY MOUTH DAILY BEFORE BREAKFAST. 06/12/24   Theophilus Andrews, Tully GRADE, MD  methocarbamol  (ROBAXIN ) 500 MG tablet Take 1 tablet (500 mg total) by mouth every 6 (six) hours as needed for muscle spasms. 11/16/21   Danton Lauraine LABOR, PA-C  Multiple Vitamin (MULTIVITAMIN WITH MINERALS) TABS tablet Take 1 tablet by mouth daily. Centrum    [provider]  Oxycodone  HCl 10 MG TABS Take 0.5-1 tablets (5-10 mg total) by mouth every 4 (four) hours as needed (5mg  for moderate pain, 10mg  for severe pain). 11/16/21   Danton Lauraine LABOR, PA-C  polyethylene glycol (MIRALAX  / GLYCOLAX ) 17 g packet Take 17 g by mouth daily as needed for mild constipation. 11/16/21   Danton Lauraine LABOR, PA-C  Semaglutide ,0.25 or 0.5MG /DOS, (OZEMPIC , 0.25 OR 0.5 MG/DOSE,) 2 MG/3ML SOPN Inject 0.25 mg once a week for the first 4 weeks and then increase to 0.5 mg weekly. 03/21/24   Theophilus Andrews, Tully GRADE, MD  sildenafil  (VIAGRA ) 100 MG tablet Take 0.5-1 tablets (50-100 mg total) by mouth daily as needed for erectile dysfunction. 02/25/21   Theophilus Andrews,  Tully GRADE, MD    Allergies: No Known Allergies  Past Medical History: Past Medical History:  Diagnosis Date   Allergy    Diabetes mellitus    Hepatitis B infection without delta agent without hepatic coma    chronic, inactive.  followed by [facility] ID service.      Past Surgical History:  Procedure Laterality Date   BIOPSY  01/19/2020   Procedure: BIOPSY;  Surgeon: Aneita Gwendlyn DASEN, MD;  Location: [facility] ENDOSCOPY;  Service: Endoscopy;;   COLONOSCOPY WITH PROPOFOL  N/A 01/19/2020   Procedure: COLONOSCOPY WITH PROPOFOL ;  Surgeon: Aneita Gwendlyn DASEN, MD;   Location: [facility] ENDOSCOPY;  Service: Endoscopy;  Laterality: N/A;   ESOPHAGOGASTRODUODENOSCOPY (EGD) WITH PROPOFOL  N/A 01/19/2020   Procedure: ESOPHAGOGASTRODUODENOSCOPY (EGD) WITH PROPOFOL ;  Surgeon: Aneita Gwendlyn DASEN, MD;  Location: [facility] ENDOSCOPY;  Service: Endoscopy;  Laterality: N/A;   HOT HEMOSTASIS N/A 01/19/2020   Procedure: HOT HEMOSTASIS (ARGON PLASMA COAGULATION/BICAP);  Surgeon: Aneita Gwendlyn DASEN, MD;  Location: [facility] ENDOSCOPY;  Service: Endoscopy;  Laterality: N/A;   ORIF ANKLE FRACTURE Right 11/13/2021   Procedure: OPEN REDUCTION INTERNAL FIXATION (ORIF) ANKLE FRACTURE;  Surgeon: Kendal Franky SQUIBB, MD;  Location: [facility] OR;  Service: Orthopedics;  Laterality: Right;   ORIF CLAVICULAR FRACTURE Left 11/13/2021   Procedure: OPEN REDUCTION INTERNAL FIXATION (ORIF) CLAVICULAR FRACTURE;  Surgeon: Kendal Franky SQUIBB, MD;  Location: [facility] OR;  Service: Orthopedics;  Laterality: Left;   POLYPECTOMY  01/19/2020   Procedure: POLYPECTOMY;  Surgeon: Aneita Gwendlyn DASEN, MD;  Location: [facility] ENDOSCOPY;  Service: Endoscopy;;    Social History: Lives with his wife.  No smoking alcohol use or recreational drug use.  Usually independent with daily activities.   Family History:  Family History  Problem Relation Age of Onset   Colon cancer Neg Hx      Review of Systems - History obtained from the patient General ROS: positive for  - fatigue Psychological ROS: negative Ophthalmic ROS: negative ENT ROS: negative Allergy and Immunology ROS: negative Hematological and Lymphatic ROS: negative Endocrine ROS: negative Respiratory ROS: no cough, shortness of breath, or wheezing Cardiovascular ROS: no chest pain or dyspnea on exertion Gastrointestinal ROS: As in HPI Genito-Urinary ROS: no dysuria, trouble voiding, or hematuria Musculoskeletal ROS: negative Neurological ROS: no TIA or stroke symptoms Dermatological ROS: negative  Physical Examination  Vitals:   06/28/24 1452 06/28/24 1513 06/28/24 1609 06/28/24 1630  BP:  112/73 111/76 98/62 119/73  Pulse: 77 77 95   Resp: 16 16 16    Temp: (!) 97.4 F (36.3 C) 98.4 F (36.9 C) 97.8 F (36.6 C)   TempSrc: Oral Oral Oral   SpO2: 100% 100% 100%     BP 119/73 (BP Location: Left Arm)   Pulse 95   Temp 97.8 F (36.6 C) (Oral)   Resp 16   SpO2 100%   General appearance: alert, cooperative, appears stated age, and no distress Head: Normocephalic, without obvious abnormality, atraumatic Eyes: conjunctivae/corneas clear. PERRL, EOM's intact.  Throat: lips, mucosa, and tongue normal; teeth and gums normal Neck: no adenopathy, no carotid bruit, no JVD, supple, symmetrical, trachea midline, and thyroid  not enlarged, symmetric, no tenderness/mass/nodules Resp: clear to auscultation bilaterally Cardio: regular rate and rhythm, S1, S2 normal, no murmur, click, rub or gallop GI: soft, non-tender; bowel sounds normal; no masses,  no organomegaly Extremities: extremities normal, atraumatic, no cyanosis or edema Pulses: 2+ and symmetric Skin: Skin color, texture, turgor normal. No rashes or lesions Lymph nodes: Cervical, supraclavicular, and axillary nodes normal. Neurologic:  Alert and oriented x 3.  Cranial nerves II to XII intact.  Motor strength equal bilateral upper and lower extremity.  Gait not assessed.   Labs on Admission: I have personally reviewed following labs and imaging studies  CBC: Recent Labs  Lab 06/28/24 1151  WBC 5.7  HGB 11.2*  HCT 33.5*  MCV 96.8  PLT 129*   Basic Metabolic Panel: Recent Labs  Lab 06/28/24 1320  NA 134*  K 4.2  CL 105  CO2 21*  GLUCOSE 227*  BUN 14  CREATININE 0.68  CALCIUM  7.7*   GFR: CrCl cannot be calculated (Unknown ideal weight.). Liver Function Tests: Recent Labs  Lab 06/28/24 1320  AST 16  ALT 15  ALKPHOS 64  BILITOT 0.3  PROT 5.1*  ALBUMIN  3.0*   Recent Labs  Lab 06/28/24 1320  LIPASE 43    CBG: Recent Labs  Lab 06/28/24 1732  GLUCAP 181*     Radiological Exams on  Admission: CT Angio Abd/Pel W and/or Wo Contrast Result Date: 06/28/2024 CLINICAL DATA:  Possible lower GI bleed. Dark red blood in stool since this morning. Five episodes. Dizziness. EXAM: CTA ABDOMEN AND PELVIS WITHOUT AND WITH CONTRAST TECHNIQUE: Multidetector CT imaging of the abdomen and pelvis was performed using the standard protocol during bolus administration of intravenous contrast. Multiplanar reconstructed images and MIPs were obtained and reviewed to evaluate the vascular anatomy. RADIATION DOSE REDUCTION: This exam was performed according to the departmental dose-optimization program which includes automated exposure control, adjustment of the mA and/or kV according to patient size and/or use of iterative reconstruction technique. CONTRAST:  85mL OMNIPAQUE  IOHEXOL  350 MG/ML SOLN COMPARISON:  11/11/2021 FINDINGS: VASCULAR Aorta: Normal caliber aorta without aneurysm, dissection, vasculitis or significant stenosis. Celiac: Patent without evidence of aneurysm, dissection, vasculitis or significant stenosis. SMA: Patent without evidence of aneurysm, dissection, vasculitis or significant stenosis. Renals: Both renal arteries are patent without evidence of aneurysm, dissection, vasculitis, fibromuscular dysplasia or significant stenosis. IMA: Patent without evidence of aneurysm, dissection, vasculitis or significant stenosis. Inflow: Patent without evidence of aneurysm, dissection, vasculitis or significant stenosis. Proximal Outflow: Bilateral common femoral and visualized portions of the superficial and profunda femoral arteries are patent without evidence of aneurysm, dissection, vasculitis or significant stenosis. Veins: No obvious venous abnormality within the limitations of this arterial phase study. Review of the MIP images confirms the above findings. NON-VASCULAR Lower chest: Heart is normal size.  Visualized lung bases are clear. Hepatobiliary: Liver, gallbladder and biliary tree are normal.  Pancreas: Normal. Spleen: Normal. Adrenals/Urinary Tract: Adrenal glands are normal. Kidneys are normal in size without hydronephrosis or nephrolithiasis. Kidneys are otherwise unchanged. Ureters and bladder are normal. Stomach/Bowel: Stomach and small bowel are normal. Appendix is normal. Minimal diverticulosis of the colon. Lymphatic: No adenopathy. Reproductive: Prostate is unremarkable. Other: No significant free peritoneal fluid or focal inflammatory change. Musculoskeletal: Minimal loss of mid vertebral body height of T10 with small associated Schmorl's node new since the prior exam. IMPRESSION: 1. No acute findings in the abdomen/pelvis. No evidence of active GI bleed. 2. Minimal colonic diverticulosis. Electronically Signed   By: Toribio Agreste M.D.   On: 06/28/2024 13:59     Problem List  Principal Problem:   GI bleed Active Problems:   DM (diabetes mellitus), type 2 (HCC)   Hepatitis B, chronic (HCC)   Assessment: This is a 63 year old male with past medical history as stated earlier who comes in with GI bleed.  Most likely lower GI bleed.  Patient with history  of polyps, AVM, diverticulosis.  This is painless bleeding and most likely to be diverticular in origin.  Last colonoscopy was in 2021.  Plan:  #1. Lower GI bleed: Patient was initially hypotensive.  He was transfused 2 units of PRBC with improvement in his blood pressure.  Gastroenterology has been notified.  Monitor for signs of bleeding.  CT angiogram was negative for active bleed.  Will place on PPI for now.  As mentioned above his last colonoscopy was in 2021 which revealed AVM, polyps, diverticulosis.  Hyperplastic polyps noted on pathology.  Upper endoscopy at that time revealed gastritis without any evidence of active bleeding.  No H. pylori detected.  #2.  Acute blood loss anemia: He has been transfused 2 units of PRBC due to unstable hemodynamics at presentation.  Will recheck his hemoglobin now and monitor every 6 hours  for now.  #3.  Diabetes mellitus type 2: Monitor CBGs.  Hold his home medications for now  #4.  Chronic hepatitis B: Outpatient management.  Liver noted to be normal on CT scan.  #5. Hyponatremia: IV fluids.  Recheck labs in the morning.  #6.  Thrombocytopenia: Mild thrombocytopenia noted.  Low compared to previous values.  Will recheck labs in the morning.   DVT Prophylaxis: SCDs Code Status: Full code Family Communication: Discussed with patient Disposition: Hopefully return home when improved Consults called: [facility] Admission Status: Status is: Observation The patient remains OBS appropriate and will d/c before 2 midnights.    Severity of Illness: The appropriate patient status for this patient is OBSERVATION. Observation status is judged to be reasonable and necessary in order to provide the required intensity of service to ensure the patient's safety. The patient's presenting symptoms, physical exam findings, and initial radiographic and laboratory data in the context of their medical condition is felt to place them at decreased risk for further clinical deterioration. Furthermore, it is anticipated that the patient will be medically stable for discharge from the hospital within 2 midnights of admission.    Further management decisions will depend on results of further testing and patient's response to treatment.   Delva Derden  [facility] Pager on [email or web address]  06/28/2024, 5:50 PM

## 2024-06-28 NOTE — Plan of Care (Signed)

## 2024-06-28 NOTE — ED Triage Notes (Addendum)
 Patient states dark red blood in stool since this morning. 5 episodes so far. Dizziness upon standing

## 2024-06-28 NOTE — Plan of Care (Signed)

## 2024-06-28 NOTE — Significant Event (Signed)
 Rapid Response Event Note   Reason for Call :  Pt found in floor bedside BSC.  Initial Focused Assessment:  Pt lying on R side in floor. He is breathing and has a pulse but is unresponsive. BP-65/42. 1L NS being given. There is blood with clots in the BSC. Suction set up at bedside. After a few minutes, pt began to respond. He was placed back in the bed. He is c/o dizziness but denies any pain. He is oriented. Skin cool/clammy.  T-97.6, BP-87/65(while bolus going), RR-18, SpO2-95% on RA  Interventions:  CBG-303 1L NS CBC Transfuse 1 unit PRBCs Pt has 2 PIVs already  CT ang A/P STAT Plan of Care:  Pt BP responding to fluid bolus. Blood transfusion ordered. Give blood once available. Pt needs STAT CT ang-obtain as soon as possible. Please call RR if further assistance needed.  Event Summary:   MD Notified: Dr. Franky Call Time:2259 Arrival Time:2305 End Upfz:[id number]  Tish Graeme Piety, RN

## 2024-06-28 NOTE — Telephone Encounter (Signed)
 FYI Only or Action Required?: FYI only for provider: appointment scheduled on 06/28/24.  Patient was last seen in primary care on 03/21/2024 by Christian Vance, Tully GRADE, MD.  Called Nurse Triage reporting Rectal Bleeding.  Symptoms began today.  Interventions attempted: Nothing.  Symptoms are: 2 episodes of diarrhea this morning with rectal bleeding (dark red to red blood in toilet water and on toilet tissue; decreased by second episode stable.  Triage Disposition: See Physician Within 24 Hours  Patient/caregiver understands and will follow disposition?: Yes           Copied from CRM #[id number]. Topic: Clinical - Red Word Triage >> Jun 28, 2024  8:54 AM Laymon HERO wrote: Red Word that prompted transfer to Nurse Triage: Patient wife calling with husband- woke up and noticed blood in stool. Reason for Disposition  MODERATE rectal bleeding (e.g., small blood clots, passing blood without stool, or toilet water turns red)  Answer Assessment - Initial Assessment Questions RN gave strict precautions to call back for new or worsening symptoms.   1. APPEARANCE of BLOOD: What color is it? Is it passed separately, on the surface of the stool, or mixed in with the stool?      First was dark red, the second was red. Blood was in the water.  2. AMOUNT: How much blood was passed?      Unsure the amount. Wife states it was a lot but not compared to when he had it in 2021. Blood in the toilet water and blood on toilet tissue (the first time was a lot on the tissue and not as much on the second time).   3. FREQUENCY: How many times has blood been passed with the stools?      Twice.  4. ONSET: When was the blood first seen in the stools? (Days or weeks)      This morning.  5. DIARRHEA: Is there also some diarrhea? If Yes, ask: How many diarrhea stools in the past 24 hours?      Yes, this morning. Two episodes this morning.  6. CONSTIPATION: Do you have constipation? If  Yes, ask: How bad is it?     No.  7. RECURRENT SYMPTOMS: Have you had blood in your stools before? If Yes, ask: When was the last time? and What happened that time?      Yes, 2021. Wife states it was much worse that time and it was diverticulosis.  8. BLOOD THINNERS: Do you take any blood thinners? (e.g., aspirin , clopidogrel / Plavix, coumadin, heparin). Notes: Other strong blood thinners include: Arixtra (fondaparinux), Eliquis (apixaban), Pradaxa (dabigatran), and Xarelto (rivaroxaban).     Aspirin .  9. OTHER SYMPTOMS: Do you have any other symptoms?  (e.g., abdomen pain, vomiting, dizziness, fever)     Denies abdominal pain, nausea, vomiting, fever, lightheaded/dizzy, clammy or sweating.  Protocols used: Rectal Bleeding-A-AH

## 2024-06-28 NOTE — Consult Note (Addendum)
 Consultation  Referring Provider: [facility] Primary Care Physician:  Theophilus Andrews, Tully GRADE, MD Primary Gastroenterologist:  Dr.Stark -previous  Reason for Consultation: Acute presumed lower GI hemorrhage  HPI: Christian Vance is a 62 y.o. male with history of diabetes mellitus, history of chronic inactive hepatitis B, patient also known to have diverticulosis, and colon polyps.  He presented to the emergency room early this morning after he had onset of abdominal rumbling, then passed a large amount of bright red blood in the commode.  He had 2 or 3 episodes at home prior to presenting to the emergency room at [facility].  He says he when he arrived to the ER he was feeling lightheaded and dizzy but did not have syncope. He had 2-3 more episodes of continued large-volume hematochezia while in the ER, he says this appears to be getting a bit darker but he cannot say that he saw any clots.  The last episode was perhaps 2-1/2 hours ago.  Labs in the ER this morning WBC 5.7/hemoglobin 11.2/hematocrit 33.5 (down from a baseline hemoglobin of 14.6 in January). Potassium 4.2/BUN 14/creatinine 0.68 LFTs normal Lipase within normal limits  CT angio was done which showed minimal diverticulosis and otherwise negative exam no evidence of active extravasation to suggest active bleeding.  Patient says he has been taking an aspirin  every day he is not sure of the dosage but his med list states acetaminophen .  He is not on any blood thinners.  He did have an admission in 2021 for an acute lower GI bleed and at that time had undergone EGD per Dr. 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  Path on the polyps consistent with hyperplastic  polyps.  He has not had any bleeding episodes since that time.  Patient was initially mildly hypotensive in the emergency room and [facility].  He was given 2 units of packed RBCs emergently, and IV fluids.  He has been hemodynamically stable since then currently blood pressure 120/70/pulse in the 90s.  He says he feels better after the transfusions, he is not having any lightheadedness or dizziness, no shortness of breath, no chest pain. He does not feel like he is still bleeding as he is not having any unsettled feeling in his abdomen..   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  followed by [facility] ID service.      Past Surgical History:  Procedure Laterality Date   BIOPSY  01/19/2020   Procedure: BIOPSY;  Surgeon: Aneita Gwendlyn DASEN, MD;  Location: [facility] ENDOSCOPY;  Service: Endoscopy;;   COLONOSCOPY WITH PROPOFOL  N/A 01/19/2020   Procedure: COLONOSCOPY WITH PROPOFOL ;  Surgeon: Aneita Gwendlyn DASEN, MD;  Location: [facility] ENDOSCOPY;  Service: Endoscopy;  Laterality: N/A;   ESOPHAGOGASTRODUODENOSCOPY (EGD) WITH PROPOFOL  N/A 01/19/2020   Procedure: ESOPHAGOGASTRODUODENOSCOPY (EGD) WITH PROPOFOL ;  Surgeon: Aneita Gwendlyn DASEN, MD;  Location: [facility] ENDOSCOPY;  Service: Endoscopy;  Laterality: N/A;   HOT HEMOSTASIS N/A 01/19/2020   Procedure: HOT HEMOSTASIS (ARGON PLASMA COAGULATION/BICAP);  Surgeon: Aneita Gwendlyn DASEN, MD;  Location: [facility] ENDOSCOPY;  Service: Endoscopy;  Laterality: N/A;   ORIF ANKLE FRACTURE  Right 11/13/2021   Procedure: OPEN REDUCTION INTERNAL FIXATION (ORIF) ANKLE FRACTURE;  Surgeon: Kendal Franky SQUIBB, MD;  Location: [facility] OR;  Service: Orthopedics;  Laterality: Right;   ORIF CLAVICULAR FRACTURE Left 11/13/2021   Procedure: OPEN REDUCTION INTERNAL FIXATION (ORIF) CLAVICULAR FRACTURE;  Surgeon: Kendal Franky SQUIBB, MD;  Location: [facility] OR;  Service: Orthopedics;  Laterality: Left;   POLYPECTOMY   01/19/2020   Procedure: POLYPECTOMY;  Surgeon: Aneita Gwendlyn DASEN, MD;  Location: [facility] ENDOSCOPY;  Service: Endoscopy;;    Prior to Admission medications   Medication Sig Start Date End Date Taking? Authorizing Provider  ACCU-CHEK GUIDE TEST test strip 1 EACH BY IN VITRO ROUTE IN THE MORNING, AT NOON, AND AT BEDTIME. MAY SUBSTITUTE TO ANY MANUFACTURER COVERED BY PATIENT'S INSURANCE. 01/16/24   Theophilus Andrews, Tully GRADE, MD  Accu-Chek Softclix Lancets lancets USE AS DIRECTED 4 TIMES A DAY 06/17/21   Theophilus Andrews, Tully GRADE, MD  acetaminophen  (TYLENOL ) 650 MG CR tablet Take 650 mg by mouth every 8 (eight) hours as needed for pain.    [provider]  Blood Glucose Monitoring Suppl DEVI 1 each by Does not apply route in the morning, at noon, and at bedtime. May substitute to any manufacturer covered by patient's insurance. 12/13/23   Theophilus Andrews, Tully GRADE, MD  cetirizine  (ZYRTEC ) 10 MG tablet Take 1 tablet (10 mg total) by mouth daily. 01/22/20   Theophilus Andrews, Tully GRADE, MD  JARDIANCE  10 MG TABS tablet TAKE 1 TABLET BY MOUTH DAILY BEFORE BREAKFAST. 06/12/24   Theophilus Andrews, Tully GRADE, MD  methocarbamol  (ROBAXIN ) 500 MG tablet Take 1 tablet (500 mg total) by mouth every 6 (six) hours as needed for muscle spasms. 11/16/21   Danton Lauraine LABOR, PA-C  Multiple Vitamin (MULTIVITAMIN WITH MINERALS) TABS tablet Take 1 tablet by mouth daily. Centrum    [provider]  Oxycodone  HCl 10 MG TABS Take 0.5-1 tablets (5-10 mg total) by mouth every 4 (four) hours as needed (5mg  for moderate pain, 10mg  for severe pain). 11/16/21   Danton Lauraine LABOR, PA-C  polyethylene glycol (MIRALAX  / GLYCOLAX ) 17 g packet Take 17 g by mouth daily as needed for mild constipation. 11/16/21   Danton Lauraine LABOR, PA-C  Semaglutide ,0.25 or 0.5MG /DOS, (OZEMPIC , 0.25 OR 0.5 MG/DOSE,) 2 MG/3ML SOPN Inject 0.25 mg once a week for the first 4 weeks and then increase to 0.5 mg weekly. 03/21/24   Theophilus Andrews, Tully GRADE, MD   sildenafil  (VIAGRA ) 100 MG tablet Take 0.5-1 tablets (50-100 mg total) by mouth daily as needed for erectile dysfunction. 02/25/21   Theophilus Andrews, Tully GRADE, MD    Current Facility-Administered Medications  Medication Dose Route Frequency Provider Last Rate Last Admin   0.9 %  sodium chloride  infusion (Manually program via Guardrails IV Fluids)   Intravenous Once Mannie Pac T, DO       pantoprazole  (PROTONIX ) injection 40 mg  40 mg Intravenous Q12H Esterwood, Amy S, PA-C        Allergies as of 06/28/2024   (No Known Allergies)    Family History  Problem Relation Age of Onset   Colon cancer Neg Hx     Social History   Socioeconomic History   Marital status: Married    Spouse name: [name]   Number of children: Not on file   Years of education: Not on file   Highest education level: Doctorate  Occupational History   Occupation: [employer] driver  Tobacco Use   Smoking status: Never  Smokeless tobacco: Never  Vaping Use   Vaping status: Never Used  Substance and Sexual Activity   Alcohol use: No   Drug use: No   Sexual activity: Not on file  Other Topics Concern   Not on file  Social History Narrative   Work or School: self employed, transportation      Home Situation: lives with wife      Spiritual Beliefs:      Lifestyle: no regular exercise; diet is healthy      Patient is right handed. He lives with his wife in a 1st floor apartment. He drinks 2-3 cups of coffee a day, and ocassionally tea. He does not exercise.         Social Drivers of Corporate Investment Banker Strain: Not on file  Food Insecurity: Not on file  Transportation Needs: Not on file  Physical Activity: Not on file  Stress: Not on file  Social Connections: Not on file  Intimate Partner Violence: Not on file    Review of Systems: Pertinent positive and negative review of systems were noted in the above HPI section.  All other review of systems was otherwise negative.   Physical  Exam: Vital signs in last 24 hours: Temp:  [97.4 F (36.3 C)-98.4 F (36.9 C)] 97.8 F (36.6 C) (11/20 1609) Pulse Rate:  [65-95] 95 (11/20 1609) Resp:  [14-18] 16 (11/20 1609) BP: (82-119)/(58-76) 119/73 (11/20 1630) SpO2:  [96 %-100 %] 100 % (11/20 1609) Last BM Date : 06/28/24 General:   Alert,  Well-developed, well-nourished, older male pleasant and cooperative in NAD Head:  Normocephalic and atraumatic. Eyes:  Sclera clear, no icterus.   Conjunctiva pink. Ears:  Normal auditory acuity. Nose:  No deformity, discharge,  or lesions. Mouth:  No deformity or lesions.   Neck:  Supple; no masses or thyromegaly. Lungs:  Clear throughout to auscultation.   No wheezes, crackles, or rhonchi.  Heart:  Regular rate and rhythm; no murmurs, clicks, rubs,  or gallops. Abdomen:  Soft,nontender, BS active,nonpalp mass or hsm.   Rectal: Not done, documented grossly bloody stool in the ER Msk:  Symmetrical without gross deformities. . Pulses:  Normal pulses noted. Extremities:  Without clubbing or edema. Neurologic:  Alert and  oriented x4;  grossly normal neurologically. Skin:  Intact without significant lesions or rashes.. Psych:  Alert and cooperative. Normal mood and affect.  Intake/Output from previous day: No intake/output data recorded. Intake/Output this shift: Total I/O In: 1000.5 [IV Piggyback:1000.5] Out: -   Lab Results: Recent Labs    06/28/24 1151  WBC 5.7  HGB 11.2*  HCT 33.5*  PLT 129*   BMET Recent Labs    06/28/24 1320  NA 134*  K 4.2  CL 105  CO2 21*  GLUCOSE 227*  BUN 14  CREATININE 0.68  CALCIUM  7.7*   LFT Recent Labs    06/28/24 1320  PROT 5.1*  ALBUMIN  3.0*  AST 16  ALT 15  ALKPHOS 64  BILITOT 0.3   PT/INR No results for input(s): LABPROT, INR in the last 72 hours. Hepatitis Panel No results for input(s): HEPBSAG, HCVAB, HEPAIGM, HEPBIGM in the last 72 hours.   IMPRESSION:  #95 63 year old male admitted after onset early  in the morning hours today with painless grossly bloody stools.  Patient had 2 or 3 episodes at home, presented to the emergency room at [facility] where he was lightheaded and dizzy and mildly hypotensive.  Initial hemoglobin 11.2  Patient had 2-3  more episodes of large-volume bloody stool while in the emergency room  He received 2 units of packed RBCs emergently  CTA was done which did not show any extravasation to suggest active bleeding, was noted to have minimal diverticulosis and otherwise unremarkable study.  Suspect this is a second acute diverticular hemorrhage for this patient, he did have an admission in 2021 with similar bleeding and had EGD and colonoscopy at that time as outlined which showed a few diverticuli scattered in the colon and he also had a nonbleeding cecal AVM treated with APC, as well as removal of small polyps.  Also need to consider other source for acute lower GI bleed i.e. AVM or other mucosal ulceration  #2 anemia acute secondary to acute GI blood loss #3 diabetes mellitus-currently on Ozempic  4.  Chronic inactive hepatitis B  Plan; clear liquids, n.p.o. after midnight Stat hemoglobin now, transfuse to keep his hemoglobin above 8, trend hemoglobin every 6 hours Cover with IV PPI for now We will plan MiraLAX  Gatorade prep this evening, to clear his bowel, and schedule for colonoscopy tomorrow a.m. with Dr. San.  Plans were discussed with the patient this evening including indications risks and benefits and he is agreeable to proceed.  If he develops active major bleed this evening with hemodynamic instability or significant drop in hemoglobin he would need to go for repeat urgent CTA, and notify IR for potential emergent angiography and embolization   GI will follow with you.     Amy EsterwoodPA-C  06/28/2024, 5:05 PM     Attending physician's note   I have taken a history, reviewed the chart, and examined the patien alongside Amy Esterwood,  PA-C.  I performed a substantive portion of this encounter, including complete performance of at least one of the key components, in conjunction with the APP. I agree with the APP's note, impression, and recommendations with my edits.   [address], DO, FACG [phone] office

## 2024-06-28 NOTE — ED Notes (Signed)
 Patient transported to CT with RN

## 2024-06-28 NOTE — Telephone Encounter (Signed)
 Spouse called back, since last call, x2 diarrhea, dark red; looks heavy blood, no stool, just blood.  Denies dizziness, faint, chest pain, sob, fever, chills, nausea, vomiting, abdominal pain.  Hx diverticulosis    Advised ED now.

## 2024-06-29 ENCOUNTER — Encounter (HOSPITAL_COMMUNITY): Payer: Self-pay | Admitting: Internal Medicine

## 2024-06-29 ENCOUNTER — Inpatient Hospital Stay (HOSPITAL_COMMUNITY): Admitting: Certified Registered Nurse Anesthetist

## 2024-06-29 ENCOUNTER — Encounter (HOSPITAL_COMMUNITY)
Admission: EM | Disposition: A | Discharge status: Home / Self Care | Payer: Self-pay | Source: Ambulatory Visit | Attending: Pulmonary Disease

## 2024-06-29 ENCOUNTER — Observation Stay (HOSPITAL_COMMUNITY)

## 2024-06-29 DIAGNOSIS — K922 Gastrointestinal hemorrhage, unspecified: Secondary | ICD-10-CM | POA: Diagnosis present

## 2024-06-29 DIAGNOSIS — D696 Thrombocytopenia, unspecified: Secondary | ICD-10-CM

## 2024-06-29 DIAGNOSIS — D62 Acute posthemorrhagic anemia: Secondary | ICD-10-CM

## 2024-06-29 DIAGNOSIS — D127 Benign neoplasm of rectosigmoid junction: Secondary | ICD-10-CM

## 2024-06-29 DIAGNOSIS — K5731 Diverticulosis of large intestine without perforation or abscess with bleeding: Secondary | ICD-10-CM | POA: Diagnosis present

## 2024-06-29 DIAGNOSIS — K5730 Diverticulosis of large intestine without perforation or abscess without bleeding: Secondary | ICD-10-CM

## 2024-06-29 DIAGNOSIS — E871 Hypo-osmolality and hyponatremia: Secondary | ICD-10-CM

## 2024-06-29 DIAGNOSIS — D122 Benign neoplasm of ascending colon: Secondary | ICD-10-CM | POA: Diagnosis present

## 2024-06-29 DIAGNOSIS — I959 Hypotension, unspecified: Secondary | ICD-10-CM | POA: Diagnosis not present

## 2024-06-29 DIAGNOSIS — D128 Benign neoplasm of rectum: Secondary | ICD-10-CM | POA: Diagnosis not present

## 2024-06-29 DIAGNOSIS — D649 Anemia, unspecified: Secondary | ICD-10-CM

## 2024-06-29 DIAGNOSIS — Z7984 Long term (current) use of oral hypoglycemic drugs: Secondary | ICD-10-CM

## 2024-06-29 DIAGNOSIS — K648 Other hemorrhoids: Secondary | ICD-10-CM | POA: Diagnosis present

## 2024-06-29 DIAGNOSIS — S36593A Other injury of sigmoid colon, initial encounter: Secondary | ICD-10-CM

## 2024-06-29 DIAGNOSIS — D125 Benign neoplasm of sigmoid colon: Secondary | ICD-10-CM | POA: Diagnosis present

## 2024-06-29 DIAGNOSIS — E1165 Type 2 diabetes mellitus with hyperglycemia: Secondary | ICD-10-CM | POA: Diagnosis present

## 2024-06-29 DIAGNOSIS — Z7985 Long-term (current) use of injectable non-insulin antidiabetic drugs: Secondary | ICD-10-CM | POA: Diagnosis not present

## 2024-06-29 DIAGNOSIS — B1910 Unspecified viral hepatitis B without hepatic coma: Secondary | ICD-10-CM | POA: Diagnosis not present

## 2024-06-29 DIAGNOSIS — K5900 Constipation, unspecified: Secondary | ICD-10-CM | POA: Diagnosis not present

## 2024-06-29 DIAGNOSIS — K562 Volvulus: Secondary | ICD-10-CM | POA: Diagnosis not present

## 2024-06-29 DIAGNOSIS — K621 Rectal polyp: Secondary | ICD-10-CM | POA: Diagnosis present

## 2024-06-29 DIAGNOSIS — E119 Type 2 diabetes mellitus without complications: Secondary | ICD-10-CM | POA: Diagnosis not present

## 2024-06-29 DIAGNOSIS — R578 Other shock: Secondary | ICD-10-CM | POA: Diagnosis present

## 2024-06-29 DIAGNOSIS — Z7982 Long term (current) use of aspirin: Secondary | ICD-10-CM | POA: Diagnosis not present

## 2024-06-29 DIAGNOSIS — B181 Chronic viral hepatitis B without delta-agent: Secondary | ICD-10-CM | POA: Diagnosis present

## 2024-06-29 DIAGNOSIS — K921 Melena: Secondary | ICD-10-CM | POA: Diagnosis not present

## 2024-06-29 HISTORY — PX: COLONOSCOPY: SHX5424

## 2024-06-29 HISTORY — PX: HEMOSTASIS CLIP PLACEMENT: SHX6857

## 2024-06-29 LAB — CBC
HCT: 26.9 % — ABNORMAL LOW (ref 39.0–52.0)
HCT: 31.1 % — ABNORMAL LOW (ref 39.0–52.0)
HCT: 26.3 % — ABNORMAL LOW (ref 39.0–52.0)
HCT: 27.9 % — ABNORMAL LOW (ref 39.0–52.0)
Hemoglobin: 8.7 g/dL — ABNORMAL LOW (ref 13.0–17.0)
Hemoglobin: 10.3 g/dL — ABNORMAL LOW (ref 13.0–17.0)
Hemoglobin: 9.2 g/dL — ABNORMAL LOW (ref 13.0–17.0)
Hemoglobin: 9.3 g/dL — ABNORMAL LOW (ref 13.0–17.0)
MCH: 31.5 pg (ref 26.0–34.0)
MCH: 31.7 pg (ref 26.0–34.0)
MCH: 32.1 pg (ref 26.0–34.0)
MCH: 30.8 pg (ref 26.0–34.0)
MCHC: 32.3 g/dL (ref 30.0–36.0)
MCHC: 33.1 g/dL (ref 30.0–36.0)
MCHC: 35 g/dL (ref 30.0–36.0)
MCHC: 33.3 g/dL (ref 30.0–36.0)
MCV: 97.5 fL (ref 80.0–100.0)
MCV: 95.7 fL (ref 80.0–100.0)
MCV: 91.6 fL (ref 80.0–100.0)
MCV: 92.4 fL (ref 80.0–100.0)
Platelets: 117 K/uL — ABNORMAL LOW (ref 150–400)
Platelets: 113 K/uL — ABNORMAL LOW (ref 150–400)
Platelets: 109 K/uL — ABNORMAL LOW (ref 150–400)
Platelets: 104 K/uL — ABNORMAL LOW (ref 150–400)
RBC: 2.76 MIL/uL — ABNORMAL LOW (ref 4.22–5.81)
RBC: 3.25 MIL/uL — ABNORMAL LOW (ref 4.22–5.81)
RBC: 2.87 MIL/uL — ABNORMAL LOW (ref 4.22–5.81)
RBC: 3.02 MIL/uL — ABNORMAL LOW (ref 4.22–5.81)
RDW: 15.1 % (ref 11.5–15.5)
RDW: 15 % (ref 11.5–15.5)
RDW: 15 % (ref 11.5–15.5)
RDW: 15.8 % — ABNORMAL HIGH (ref 11.5–15.5)
WBC: 9.7 K/uL (ref 4.0–10.5)
WBC: 16.1 K/uL — ABNORMAL HIGH (ref 4.0–10.5)
WBC: 16.2 K/uL — ABNORMAL HIGH (ref 4.0–10.5)
WBC: 17.5 K/uL — ABNORMAL HIGH (ref 4.0–10.5)
nRBC: 0 % (ref 0.0–0.2)
nRBC: 0 % (ref 0.0–0.2)
nRBC: 0 % (ref 0.0–0.2)
nRBC: 0.1 % (ref 0.0–0.2)

## 2024-06-29 LAB — TYPE AND SCREEN
ABO/RH(D): O POS
Antibody Screen: NEGATIVE
Unit division: 0
Unit division: 0

## 2024-06-29 LAB — BPAM RBC
Blood Product Expiration Date: 202512252359
Blood Product Expiration Date: 202512252359
ISSUE DATE / TIME: 202511201400
ISSUE DATE / TIME: 202511201445
Unit Type and Rh: 9500
Unit Type and Rh: 9500

## 2024-06-29 LAB — GLUCOSE, CAPILLARY
Glucose-Capillary: 120 mg/dL — ABNORMAL HIGH (ref 70–99)
Glucose-Capillary: 124 mg/dL — ABNORMAL HIGH (ref 70–99)
Glucose-Capillary: 128 mg/dL — ABNORMAL HIGH (ref 70–99)
Glucose-Capillary: 251 mg/dL — ABNORMAL HIGH (ref 70–99)
Glucose-Capillary: 69 mg/dL — ABNORMAL LOW (ref 70–99)

## 2024-06-29 LAB — BASIC METABOLIC PANEL WITH GFR
Anion gap: 6 (ref 5–15)
BUN: 10 mg/dL (ref 8–23)
CO2: 19 mmol/L — ABNORMAL LOW (ref 22–32)
Calcium: 7.2 mg/dL — ABNORMAL LOW (ref 8.9–10.3)
Chloride: 115 mmol/L — ABNORMAL HIGH (ref 98–111)
Creatinine, Ser: 0.87 mg/dL (ref 0.61–1.24)
GFR, Estimated: >60 mL/min (ref >60)
Glucose, Bld: 150 mg/dL — ABNORMAL HIGH (ref 70–99)
Potassium: 3.9 mmol/L (ref 3.5–5.1)
Sodium: 140 mmol/L (ref 135–145)

## 2024-06-29 LAB — COMPREHENSIVE METABOLIC PANEL WITH GFR
ALT: 14 U/L (ref 0–44)
AST: 18 U/L (ref 15–41)
Albumin: 2.5 g/dL — ABNORMAL LOW (ref 3.5–5.0)
Alkaline Phosphatase: 45 U/L (ref 38–126)
Anion gap: 11 (ref 5–15)
BUN: 11 mg/dL (ref 8–23)
CO2: 16 mmol/L — ABNORMAL LOW (ref 22–32)
Calcium: 7.4 mg/dL — ABNORMAL LOW (ref 8.9–10.3)
Chloride: 111 mmol/L (ref 98–111)
Creatinine, Ser: 1.07 mg/dL (ref 0.61–1.24)
GFR, Estimated: >60 mL/min (ref >60)
Glucose, Bld: 264 mg/dL — ABNORMAL HIGH (ref 70–99)
Potassium: 4.3 mmol/L (ref 3.5–5.1)
Sodium: 138 mmol/L (ref 135–145)
Total Bilirubin: 0.7 mg/dL (ref 0.0–1.2)
Total Protein: 4.6 g/dL — ABNORMAL LOW (ref 6.5–8.1)

## 2024-06-29 LAB — LACTIC ACID, PLASMA
Lactic Acid, Venous: 3.7 mmol/L (ref 0.5–1.9)
Lactic Acid, Venous: 2.9 mmol/L (ref 0.5–1.9)

## 2024-06-29 LAB — MRSA NEXT GEN BY PCR, NASAL: MRSA by PCR Next Gen: NOT DETECTED

## 2024-06-29 LAB — PREPARE RBC (CROSSMATCH)

## 2024-06-29 LAB — TSH: TSH: 0.587 u[IU]/mL (ref 0.350–4.500)

## 2024-06-29 LAB — CORTISOL: Cortisol, Plasma: 14.7 ug/dL

## 2024-06-29 LAB — PROTIME-INR
INR: 1.2 (ref 0.8–1.2)
Prothrombin Time: 16.3 s — ABNORMAL HIGH (ref 11.4–15.2)

## 2024-06-29 LAB — HIV ANTIBODY (ROUTINE TESTING W REFLEX): HIV Screen 4th Generation wRfx: NONREACTIVE

## 2024-06-29 MED ORDER — LIDOCAINE 2% (20 MG/ML) 5 ML SYRINGE
INTRAMUSCULAR | Status: DC | PRN
  Administered 2024-06-29: 100 mg via INTRAVENOUS

## 2024-06-29 MED ORDER — ONDANSETRON HCL 4 MG/2ML IJ SOLN
INTRAMUSCULAR | Status: DC | PRN
  Administered 2024-06-29: 4 mg via INTRAVENOUS

## 2024-06-29 MED ORDER — ORAL CARE MOUTH RINSE: 15.0000 mL | OROMUCOSAL | Status: DC | PRN

## 2024-06-29 MED ORDER — NOREPINEPHRINE 4 MG/250ML-% IV SOLN
0.0000 ug/min | INTRAVENOUS | Status: DC
  Administered 2024-06-29: 2 ug/min via INTRAVENOUS
  Administered 2024-06-30: 12 ug/min via INTRAVENOUS
  Administered 2024-06-30: 10 ug/min via INTRAVENOUS
  Filled 2024-06-29 (×3): qty 250

## 2024-06-29 MED ORDER — LACTATED RINGERS IV BOLUS
500.0000 mL | Freq: Once | INTRAVENOUS | Status: AC
  Administered 2024-06-29: 500 mL via INTRAVENOUS

## 2024-06-29 MED ORDER — SODIUM CHLORIDE 0.9 % IV SOLN
250.0000 mL | INTRAVENOUS | Status: AC
  Administered 2024-06-29: 250 mL via INTRAVENOUS

## 2024-06-29 MED ORDER — PROPOFOL 10 MG/ML IV BOLUS
INTRAVENOUS | Status: DC | PRN
  Administered 2024-06-29: 40 mg via INTRAVENOUS

## 2024-06-29 MED ORDER — IOHEXOL 350 MG/ML SOLN
75.0000 mL | Freq: Once | INTRAVENOUS | Status: AC | PRN
  Administered 2024-06-29: 75 mL via INTRAVENOUS

## 2024-06-29 MED ORDER — MIDODRINE HCL 5 MG PO TABS
10.0000 mg | ORAL_TABLET | Freq: Three times a day (TID) | ORAL | Status: DC
  Administered 2024-06-29 (×3): 10 mg via ORAL
  Filled 2024-06-29 (×3): qty 2

## 2024-06-29 MED ORDER — SODIUM CHLORIDE 0.9 % IV SOLN: INTRAVENOUS | Status: DC

## 2024-06-29 MED ORDER — PROPOFOL 500 MG/50ML IV EMUL
INTRAVENOUS | Status: DC | PRN
  Administered 2024-06-29: 75 ug/kg/min via INTRAVENOUS

## 2024-06-29 MED ORDER — LACTATED RINGERS IV BOLUS
1000.0000 mL | Freq: Once | INTRAVENOUS | Status: AC
  Administered 2024-06-29: 1000 mL via INTRAVENOUS

## 2024-06-29 MED ORDER — CHLORHEXIDINE GLUCONATE CLOTH 2 % EX PADS
6.0000 | MEDICATED_PAD | Freq: Every day | CUTANEOUS | Status: DC
  Administered 2024-06-29 – 2024-07-02 (×4): 6 via TOPICAL

## 2024-06-29 MED ORDER — INSULIN ASPART 100 UNIT/ML IJ SOLN
0.0000 [IU] | INTRAMUSCULAR | Status: DC
  Administered 2024-06-29: 2 [IU] via SUBCUTANEOUS
  Filled 2024-06-29: qty 2

## 2024-06-29 MED ORDER — PHENYLEPHRINE HCL-NACL 20-0.9 MG/250ML-% IV SOLN
INTRAVENOUS | Status: DC | PRN
Start: 2024-06-29 — End: 2024-06-29
  Administered 2024-06-29: 30 ug/min via INTRAVENOUS

## 2024-06-29 MED ORDER — SODIUM CHLORIDE 0.9% IV SOLUTION: Freq: Once | INTRAVENOUS | Status: AC

## 2024-06-29 NOTE — Progress Notes (Signed)
   06/28/24 2306  What Happened  Was fall witnessed? No  Was patient injured? No  Patient found on floor  Found by Staff-comment (found patient laying down on the ground)  Stated prior activity bathroom-unassisted  Provider Notification  Provider Name/Title Franky MD  Date Provider Notified 06/28/24  Time Provider Notified 2306  Method of Notification Call  Notification Reason Fall  Provider response See new orders  Date of Provider Response 06/28/24  Time of Provider Response 2323  Adult Fall Risk Assessment  Risk Factor Category (scoring not indicated) Fall has occurred during this admission (document High fall risk)  Age 64  Fall History: Fall within 6 months prior to admission 0  Elimination; Bowel and/or Urine Incontinence 0  Elimination; Bowel and/or Urine Urgency/Frequency 0  Medications: includes PCA/Opiates, Anti-convulsants, Anti-hypertensives, Diuretics, Hypnotics, Laxatives, Sedatives, and Psychotropics 0  Patient Care Equipment 1  Mobility-Assistance 2  Mobility-Gait 0  Mobility-Sensory Deficit 0  Altered awareness of immediate physical environment 0  Impulsiveness 0  Lack of understanding of one's physical/cognitive limitations 0  Total Score 4  Patient Fall Risk Level High fall risk  Adult Fall Risk Interventions  Required Bundle Interventions *See Row Information* High fall risk  Additional Interventions Use of appropriate toileting equipment (bedpan, BSC, etc.)  Fall intervention(s) refused/Patient educated regarding refusal Bed alarm;Nonskid socks  Screening for Fall Injury Risk (To be completed on HIGH fall risk patients) - Assessing Need for Floor Mats  Risk For Fall Injury- Criteria for Floor Mats Previous fall this admission  Will Implement Floor Mats Yes  Vitals  BP (!) 87/65  MAP (mmHg) 74  BP Location Left Arm  BP Method Automatic  Patient Position (if appropriate) Lying  Pulse Rate 95  Pulse Rate Source Monitor  ECG Heart Rate 95  Resp 19   Oxygen Therapy  SpO2 95 %  O2 Device Room Air  Pain Assessment  Pain Scale 0-10  Pain Score 0  Neurological  Neuro (WDL) WDL  Level of Consciousness Alert  Orientation Level Oriented X4  Glasgow Coma Scale  Eye Opening 4  Best Verbal Response (NON-intubated) 5  Best Motor Response 6  Glasgow Coma Scale Score 15  Musculoskeletal  Musculoskeletal (WDL) WDL  Assistive Device None  Integumentary  Integumentary (WDL) WDL

## 2024-06-29 NOTE — Anesthesia Postprocedure Evaluation (Signed)
 Anesthesia Post Note  Patient: Christian Vance  Procedure(s) Performed: COLONOSCOPY CONTROL OF HEMORRHAGE, GI TRACT, ENDOSCOPIC, BY CLIPPING OR OVERSEWING     Patient location during evaluation: PACU Anesthesia Type: MAC Level of consciousness: awake and alert Pain management: pain level controlled Vital Signs Assessment: post-procedure vital signs reviewed and stable Respiratory status: spontaneous breathing, nonlabored ventilation and respiratory function stable Cardiovascular status: stable and blood pressure returned to baseline Anesthetic complications: no   No notable events documented.  Last Vitals:  Vitals:   06/29/24 1220 06/29/24 1241  BP: (!) 98/57 (!) 89/54  Pulse: (!) 101 (!) 105  Resp: 16   Temp:  36.8 C  SpO2: 96%     Last Pain:  Vitals:   06/29/24 1255  TempSrc:   PainSc: 3                  Debby FORBES Like

## 2024-06-29 NOTE — Transfer of Care (Signed)
 Immediate Anesthesia Transfer of Care Note  Patient: Christian Vance  Procedure(s) Performed: COLONOSCOPY CONTROL OF HEMORRHAGE, GI TRACT, ENDOSCOPIC, BY CLIPPING OR OVERSEWING  Patient Location: PACU  Anesthesia Type:MAC  Level of Consciousness: awake and patient cooperative  Airway & Oxygen Therapy: Patient Spontanous Breathing and Patient connected to nasal cannula oxygen  Post-op Assessment: Report given to RN and Post -op Vital signs reviewed and stable  Post vital signs: Reviewed  Last Vitals:  Vitals Value Taken Time  BP 87/60 06/29/24 12:02  Temp    Pulse 104 06/29/24 12:04  Resp 17 06/29/24 12:04  SpO2 100 % 06/29/24 12:04  Vitals shown include unfiled device data.  Last Pain:  Vitals:   06/29/24 1202  TempSrc:   PainSc: 0-No pain      Patients Stated Pain Goal: 0 (06/28/24 2007)  Complications: No notable events documented.

## 2024-06-29 NOTE — Anesthesia Preprocedure Evaluation (Addendum)
 Anesthesia Evaluation  Patient identified by MRN, date of birth, ID band Patient awake    Reviewed: Allergy & Precautions, NPO status , Patient's Chart, lab work & pertinent test results  History of Anesthesia Complications Negative for: history of anesthetic complications  Airway Mallampati: II  TM Distance: >3 FB Neck ROM: Full    Dental  (+) Dental Advisory Given, Chipped   Pulmonary neg pulmonary ROS   Pulmonary exam normal        Cardiovascular Normal cardiovascular exam   AVM    Neuro/Psych negative neurological ROS  negative psych ROS   GI/Hepatic negative GI ROS,,,(+) Hepatitis -, B  Endo/Other  diabetes, Type 2, Oral Hypoglycemic Agents   On GLP-1a   Renal/GU negative Renal ROS     Musculoskeletal negative musculoskeletal ROS (+)    Abdominal   Peds  Hematology  (+) Blood dyscrasia, anemia  Plt 109k    Anesthesia Other Findings   Reproductive/Obstetrics                              Anesthesia Physical Anesthesia Plan  ASA: 3  Anesthesia Plan: MAC   Post-op Pain Management: Minimal or no pain anticipated   Induction:   PONV Risk Score and Plan: 1 and Propofol  infusion and Treatment may vary due to age or medical condition  Airway Management Planned: Nasal Cannula and Natural Airway  Additional Equipment: None  Intra-op Plan:   Post-operative Plan:   Informed Consent: I have reviewed the patients History and Physical, chart, labs and discussed the procedure including the risks, benefits and alternatives for the proposed anesthesia with the patient or authorized representative who has indicated his/her understanding and acceptance.       Plan Discussed with: CRNA and Anesthesiologist  Anesthesia Plan Comments:          Anesthesia Quick Evaluation

## 2024-06-29 NOTE — Progress Notes (Signed)
 PROGRESS NOTE     Patient Demographics:    Christian Vance, is a 62 y.o. male, DOB - 16-Jan-1961, FMW:[id number]  Outpatient Primary MD for the patient is Theophilus Andrews, Tully GRADE, MD    LOS - 1  Admit date - 06/28/2024    Chief Complaint  Patient presents with   Rectal Bleeding       Brief Narrative (HPI from H&P)   63 y.o. male with past medical history of diabetes mellitus type 2, previous history of GI bleed in 2021 status post colonoscopy in 2021 which revealed diverticulosis, AVM and polyps.  Patient was in his usual state of health till earlier this morning when he woke up at around 4:30 AM and went to have a bowel movement which was dark and bloody  He had about 5 more bowel movements.  He felt dizzy and lightheaded and weak.  His wife decided to bring him to the emergency department.  Initially he was hypotensive.  He was given IV fluids and then transfused 2 units of PRBC.  His blood pressure recovered, GI was consulted and he was admitted for ongoing acute lower GI bleed.   Subjective:    Christian Vance today has, No headache, No chest pain, No abdominal pain - No Nausea, No new weakness tingling or numbness, no SOB, bright red blood per rectum off-and-on multiple times since yesterday morning.   Assessment  & Plan :   #1. Lower GI bleed: Patient was initially hypotensive.  He was transfused 2 units of PRBC on 06/28/2024 with improvement in his blood pressure.  Gastroenterology the patient, CTA negative x 2, on IV PPI unfortunately continues to have off-and-on bright red blood per rectum with hypotension, getting IV fluid bolus early morning 06/28/2024 along with 2 units of packed RBC, ICU team has been informed, continue  n.p.o., midodrine  added.  Patient thankfully is asymptomatic will continue to monitor closely in the progressive care setting until he has a ICU bed.     #2.  Acute blood loss anemia: He has been transfused 2 units of PRBC on 06/28/2024, getting 2  more units as he is hypotensive on 06/29/2024 total 4 so far.   #3.Thrombocytopenia: Mild thrombocytopenia noted.  Continue to monitor.     #4.  Chronic hepatitis B: Outpatient management.  Liver noted to be normal on CT scan.  GI on board defer management to them   #5. Hyponatremia: Hydrated, will monitor   #6.  Diabetes mellitus type 2: ISS added.  Lab Results  Component Value Date   HGBA1C 9.0 (A) 03/21/2024   CBG (last 3)  Recent Labs    06/28/24 2158 06/28/24 2306 06/29/24 0729  GLUCAP 162* 303* 251*         Condition - Extremely Guarded  Family Communication  :  None present  Code Status :  Full  Consults  :  GI, ICU  PUD Prophylaxis : PPI   Procedures  :     CTA 06/29/24  -  BONES AND SOFT TISSUES: T10 superior endplate fracture again noted. No acute soft tissue abnormality. IMPRESSION: 1. No active gastrointestinal hemorrhage. 2. Mild pancolonic diverticulosis without superimposed acute inflammatory change. 3. Interval fluid distention of the stomach, nonspecific. 4. Moderate prostatic hypertrophy. 5. Unchanged T10 superior endplate fracture      Disposition Plan  :    Status is: Observation   DVT Prophylaxis  :    SCDs Start: 06/28/24 1726    Lab Results  Component Value Date   PLT 109 (L) 06/29/2024    Diet :  Diet Order             Diet NPO time specified Except for: Sips with Meds  Diet effective midnight                    Inpatient Medications  Scheduled Meds:  sodium chloride    Intravenous Once   sodium chloride    Intravenous Once   insulin  aspart  0-15 Units Subcutaneous TID WC   midodrine   10 mg Oral TID WC   pantoprazole  (PROTONIX ) IV  40 mg Intravenous Q12H   simethicone   240  mg Oral Once   Continuous Infusions:  sodium chloride      sodium chloride  125 mL/hr at 06/29/24 0307   sodium chloride      PRN Meds:.acetaminophen  **OR** acetaminophen , ondansetron  **OR** ondansetron  (ZOFRAN ) IV, sodium chloride   Antibiotics  :    Anti-infectives (From admission, onward)    None         Objective:   Vitals:   06/29/24 0645 06/29/24 0700 06/29/24 0730 06/29/24 0800  BP: (!) 86/55   (!) 86/59  Pulse: (!) 115     Resp: 19     Temp:  99.9 F (37.7 C) (!) 100.4 F (38 C) 98.4 F (36.9 C)  TempSrc:   Oral Oral  SpO2: 95%       Wt Readings from Last 3 Encounters:  03/21/24 79.9 kg  12/08/23 79.5 kg  08/24/23 79.7 kg     Intake/Output Summary (Last 24 hours) at 06/29/2024 0809 Last data filed at 06/29/2024 0444 Gross per 24 hour  Intake 1302.53 ml  Output 400 ml  Net 902.53 ml     Physical Exam  Awake Alert, No new F.N deficits, Normal affect [place].AT,PERRAL Supple Neck, No JVD,   Symmetrical Chest wall movement, Good air movement bilaterally, CTAB RRR,No Gallops,Rubs or new Murmurs,  +ve B.Sounds, Abd Soft, No tenderness,   No Cyanosis, Clubbing or edema      Data Review:    Recent Labs  Lab 06/28/24 1151 06/28/24  2125 06/28/24 2327 06/29/24 0405 06/29/24 0631  WBC 5.7 10.7* 9.7 16.1* 16.2*  HGB 11.2* 12.1* 8.7* 10.3* 9.2*  HCT 33.5* 36.1* 26.9* 31.1* 26.3*  PLT 129* 118* 117* 113* 109*  MCV 96.8 95.0 97.5 95.7 91.6  MCH 32.4 31.8 31.5 31.7 32.1  MCHC 33.4 33.5 32.3 33.1 35.0  RDW 14.1 15.0 15.1 15.0 15.0    Recent Labs  Lab 06/28/24 1320 06/29/24 0405  NA 134* 138  K 4.2 4.3  CL 105 111  CO2 21* 16*  ANIONGAP 8 11  GLUCOSE 227* 264*  BUN 14 11  CREATININE 0.68 1.07  AST 16 18  ALT 15 14  ALKPHOS 64 45  BILITOT 0.3 0.7  ALBUMIN  3.0* 2.5*  INR  --  1.2  CALCIUM  7.7* 7.4*      Recent Labs  Lab 06/28/24 1320 06/29/24 0405  INR  --  1.2  CALCIUM  7.7* 7.4*     --------------------------------------------------------------------------------------------------------------- Lab Results  Component Value Date   CHOL 109 08/24/2023   HDL 57.20 08/24/2023   LDLCALC 33 08/24/2023   TRIG 93.0 08/24/2023   CHOLHDL 2 08/24/2023    Lab Results  Component Value Date   HGBA1C 9.0 (A) 03/21/2024   No results for input(s): TSH, T4TOTAL, FREET4, T3FREE, THYROIDAB in the last 72 hours. No results for input(s): VITAMINB12, FOLATE, FERRITIN, TIBC, IRON, RETICCTPCT in the last 72 hours. ------------------------------------------------------------------------------------------------------------------ Cardiac Enzymes No results for input(s): CKMB, TROPONINI, MYOGLOBIN in the last 168 hours.  Invalid input(s): CK  Micro Results No results found for this or any previous visit (from the past 240 hours).  Radiology Report CT Angio Abd/Pel w/ and/or w/o Result Date: 06/29/2024 EXAM: CTA ABDOMEN AND PELVIS WITHOUT AND WITH CONTRAST 06/29/2024 12:52:21 AM TECHNIQUE: CTA images of the abdomen and pelvis without and with intravenous contrast. 75 mL of iohexol  (OMNIPAQUE ) 350 MG/ML injection was administered. Three-dimensional MIP/volume rendered formations were performed. Automated exposure control, iterative reconstruction, and/or weight based adjustment of the mA/kV was utilized to reduce the radiation dose to as low as reasonably achievable. COMPARISON: 06/28/2024 CLINICAL HISTORY: Lower GI bleed FINDINGS: VASCULATURE: AORTA: No acute finding. No abdominal aortic aneurysm. No dissection. CELIAC TRUNK: No acute finding. No occlusion or significant stenosis. SUPERIOR MESENTERIC ARTERY: No acute finding. No occlusion or significant stenosis. RENAL ARTERIES: No acute finding. No occlusion or significant stenosis. ILIAC ARTERIES: No acute finding. No occlusion or significant stenosis. LIVER: The liver is unremarkable. GALLBLADDER AND BILE  DUCTS: Gallbladder is unremarkable. No biliary ductal dilatation. SPLEEN: The spleen is unremarkable. PANCREAS: The pancreas is unremarkable. ADRENAL GLANDS: Bilateral adrenal glands demonstrate no acute abnormality. KIDNEYS, URETERS AND BLADDER: No stones in the kidneys or ureters. No hydronephrosis. No perinephric or periureteral stranding. Urinary bladder is unremarkable. GI AND BOWEL: Interval fluid distention of the stomach. Mild pancolonic diverticulosis without superimposed acute inflammatory change. No active gastrointestinal hemorrhage. The stomach, small bowel, and large bowel are otherwise unremarkable. Appendix absent. There is no bowel obstruction. No abnormal bowel wall thickening or distension. REPRODUCTIVE: Moderate prostatic hypertrophy. PERITONEUM AND RETRPERITONEUM: No ascites or free air. LUNG BASE: No acute abnormality. LYMPH NODES: No lymphadenopathy. BONES AND SOFT TISSUES: T10 superior endplate fracture again noted. No acute soft tissue abnormality. IMPRESSION: 1. No active gastrointestinal hemorrhage. 2. Mild pancolonic diverticulosis without superimposed acute inflammatory change. 3. Interval fluid distention of the stomach, nonspecific. 4. Moderate prostatic hypertrophy. 5. Unchanged T10 superior endplate fracture. Electronically signed by: Dorethia Molt MD 06/29/2024 01:26 AM EST RP Workstation: [id number]  CT Angio Abd/Pel W and/or Wo Contrast Result Date: 06/28/2024 CLINICAL DATA:  Possible lower GI bleed. Dark red blood in stool since this morning. Five episodes. Dizziness. EXAM: CTA ABDOMEN AND PELVIS WITHOUT AND WITH CONTRAST TECHNIQUE: Multidetector CT imaging of the abdomen and pelvis was performed using the standard protocol during bolus administration of intravenous contrast. Multiplanar reconstructed images and MIPs were obtained and reviewed to evaluate the vascular anatomy. RADIATION DOSE REDUCTION: This exam was performed according to the departmental dose-optimization  program which includes automated exposure control, adjustment of the mA and/or kV according to patient size and/or use of iterative reconstruction technique. CONTRAST:  85mL OMNIPAQUE  IOHEXOL  350 MG/ML SOLN COMPARISON:  11/11/2021 FINDINGS: VASCULAR Aorta: Normal caliber aorta without aneurysm, dissection, vasculitis or significant stenosis. Celiac: Patent without evidence of aneurysm, dissection, vasculitis or significant stenosis. SMA: Patent without evidence of aneurysm, dissection, vasculitis or significant stenosis. Renals: Both renal arteries are patent without evidence of aneurysm, dissection, vasculitis, fibromuscular dysplasia or significant stenosis. IMA: Patent without evidence of aneurysm, dissection, vasculitis or significant stenosis. Inflow: Patent without evidence of aneurysm, dissection, vasculitis or significant stenosis. Proximal Outflow: Bilateral common femoral and visualized portions of the superficial and profunda femoral arteries are patent without evidence of aneurysm, dissection, vasculitis or significant stenosis. Veins: No obvious venous abnormality within the limitations of this arterial phase study. Review of the MIP images confirms the above findings. NON-VASCULAR Lower chest: Heart is normal size.  Visualized lung bases are clear. Hepatobiliary: Liver, gallbladder and biliary tree are normal. Pancreas: Normal. Spleen: Normal. Adrenals/Urinary Tract: Adrenal glands are normal. Kidneys are normal in size without hydronephrosis or nephrolithiasis. Kidneys are otherwise unchanged. Ureters and bladder are normal. Stomach/Bowel: Stomach and small bowel are normal. Appendix is normal. Minimal diverticulosis of the colon. Lymphatic: No adenopathy. Reproductive: Prostate is unremarkable. Other: No significant free peritoneal fluid or focal inflammatory change. Musculoskeletal: Minimal loss of mid vertebral body height of T10 with small associated Schmorl's node new since the prior exam.  IMPRESSION: 1. No acute findings in the abdomen/pelvis. No evidence of active GI bleed. 2. Minimal colonic diverticulosis. Electronically Signed   By: Toribio Agreste M.D.   On: 06/28/2024 13:59     Signature  -   Lavada Stank M.D on 06/29/2024 at 8:09 AM   -  To page go to [email or web address]

## 2024-06-29 NOTE — Progress Notes (Signed)
 PT Cancellation Note  Patient Details Name: Christian Vance MRN: [id number] DOB: [birth year]   Cancelled Treatment:    Reason Eval/Treat Not Completed: Other (comment);Medical issues which prohibited therapy (Per RN, MD wants pt to stay in bed today after fall with hypotensive episode overnight. Requests PT to hold for today. Will follow up another time.)   Abdulhamid Olgin 06/29/2024, 9:23 AM

## 2024-06-29 NOTE — Interval H&P Note (Signed)
 History and Physical Interval Note:  Had suspected presyncopal event overnight with hypotension and tachycardia with SBP in the 80s and HR in the 110's.  Vomited shortly after starting bowel prep.  Large bloody bowel movement overnight.  CTA was negative.  Repeat H/H 9.2/26.3 this morning and received another unit of RBCs due to hypotension and suspected continued or recurrent bleeding.  BP response to RBC transfusion, IV fluids, and midodrine .  BM in the endoscopy unit just now was bloody.  Plan to proceed with emergent colonoscopy for therapeutic intent.  While he has not completed any prep, I think the bleed is current and brisk and causing hemodynamic changes, and therefore feel the benefits outweigh the risks with regards to colonoscopy now vs waiting for prep.  He understands and agrees.  06/29/2024 10:48 AM  Christian Vance  has presented today for surgery, with the diagnosis of Acute GI bleed.  The various methods of treatment have been discussed with the patient and family. After consideration of risks, benefits and other options for treatment, the patient has consented to  Procedure(s): COLONOSCOPY (N/A) as a surgical intervention.  The patient's history has been reviewed, patient examined, no change in status, stable for surgery.  I have reviewed the patient's chart and labs.  Questions were answered to the patient's satisfaction.     Christian Vance

## 2024-06-29 NOTE — Consult Note (Signed)
 NAME:  Christian Vance, MRN:  [id number], DOB:  1960/11/11, LOS: 1 ADMISSION DATE:  06/28/2024, CONSULTATION DATE:  06/29/24 REFERRING MD:  TRH, CHIEF COMPLAINT: GI bleed  History of Present Illness:  Christian Vance is a 63 year old male with a past medical history significant for previous GI bleed in 2021 felt secondary to diverticulosis, AVM and polyps, type 2 diabetes, and hepatitis B who presented to the ED at [facility] 11/20 for complaints of rectal bleeding.  Given concerns for active lower GI bleed patient was admitted per hospitalist.  A.m. 11/21 patient was seen hypotensive with several episodes of dark red bowel movements with clots indicating ongoing lower GI bleed.  Patient received 2 units PRBC.  Critical care and GI consulted.  Pertinent  Medical History  Previous GI bleed secondary to diverticulosis Diabetes Hepatitis B  Significant Hospital Events: Including procedures, antibiotic start and stop dates in addition to other pertinent events   11/20 presented with signs of lower GI bleed but remained hemodynamically stable 11/21 became hemodynamically unstable with active lower GI bleed this a.m. received units PRBC transferred to ICU for monitoring Underwent endoscopy which revealed no signs of active bleeding but did reveal large amount of old blood and clots throughout the colon, diverticulosis and required 1 polyp removal.   Interim History / Subjective:  Seen sitting up in bed alert and interactive, denies any pain  Objective    Blood pressure (!) 86/59, pulse (!) 115, temperature 98.4 F (36.9 C), temperature source Oral, resp. rate 19, SpO2 95%.        Intake/Output Summary (Last 24 hours) at 06/29/2024 0903 Last data filed at 06/29/2024 0444 Gross per 24 hour  Intake 1302.53 ml  Output 400 ml  Net 902.53 ml   There were no vitals filed for this visit.  Examination: General: Acute on chronic ill-appearing middle-age male lying in bed in no acute  distress HEENT: [place]/AT, MM pink/moist, PERRL,  Neuro: Oriented x 3, nonfocal CV: s1s2 regular rate and rhythm, no murmur, rubs, or gallops,  PULM: Auscultation bilaterally, no increased work of breathing, no breath sounds GI: soft, bowel sounds active in all 4 quadrants, non-tender, non-distended Extremities: warm/dry, no edema  Skin: no rashes or lesions   Resolved problem list   Assessment and Plan  Lower GI bleed with history of diverticulosis Diverticulosis in the ascending, descending, sigmoid colon -Patient continued to have signs of active lower GI bleed overnight with progressive hypotension prompting urgent endoscopy a.m. 11/21.  Endoscopy revealed large amount of old blood with clots but no signs of active bleeding there were also signs of diverticulosis P: Transfer to ICU for close monitoring giving borderline hypotension and need for vasopressors during endoscopy If hypotension reoccurs prioritize transfusions  Trend CBC  GI following, appreciate assistance Clear liquid diet, advance per GI NPO  Maintain 2 large bore PIVs  Acute blood loss anemia Thrombocytopenia P: Trend CBC Transfuse per protocol Hemoglobin goal greater than 7  Chronic hepatitis B P: Supportive care Outpatient management Avoid hepatotoxic  Hyponatremia P: Trend  Type 2 diabetes P: SSI CBG goal 140-180 CBG checks every 4  Labs   CBC: Recent Labs  Lab 06/28/24 1151 06/28/24 2125 06/28/24 2327 06/29/24 0405 06/29/24 0631  WBC 5.7 10.7* 9.7 16.1* 16.2*  HGB 11.2* 12.1* 8.7* 10.3* 9.2*  HCT 33.5* 36.1* 26.9* 31.1* 26.3*  MCV 96.8 95.0 97.5 95.7 91.6  PLT 129* 118* 117* 113* 109*    Basic Metabolic Panel: Recent Labs  Lab 06/28/24 1320 06/29/24 0405  NA 134* 138  K 4.2 4.3  CL 105 111  CO2 21* 16*  GLUCOSE 227* 264*  BUN 14 11  CREATININE 0.68 1.07  CALCIUM  7.7* 7.4*   GFR: CrCl cannot be calculated (Unknown ideal weight.). Recent Labs  Lab 06/28/24 2125  06/28/24 2327 06/29/24 0405 06/29/24 0631  WBC 10.7* 9.7 16.1* 16.2*  LATICACIDVEN  --   --   --  3.7*    Liver Function Tests: Recent Labs  Lab 06/28/24 1320 06/29/24 0405  AST 16 18  ALT 15 14  ALKPHOS 64 45  BILITOT 0.3 0.7  PROT 5.1* 4.6*  ALBUMIN  3.0* 2.5*   Recent Labs  Lab 06/28/24 1320  LIPASE 43   No results for input(s): AMMONIA in the last 168 hours.  ABG No results found for: PHART, PCO2ART, PO2ART, HCO3, TCO2, ACIDBASEDEF, O2SAT   Coagulation Profile: Recent Labs  Lab 06/29/24 0405  INR 1.2    Cardiac Enzymes: No results for input(s): CKTOTAL, CKMB, CKMBINDEX, TROPONINI in the last 168 hours.  HbA1C: Hemoglobin A1C  Date/Time Value Ref Range Status  03/21/2024 10:04 AM 9.0 (A) 4.0 - 5.6 % Final  12/08/2023 02:41 PM 7.7 (A) 4.0 - 5.6 % Final   Hgb A1c MFr Bld  Date/Time Value Ref Range Status  08/24/2023 11:41 AM 8.1 (H) 4.6 - 6.5 % Final    Comment:    Glycemic Control Guidelines for People with Diabetes:Non Diabetic:  <6%Goal of Therapy: <7%Additional Action Suggested:  >8%   02/25/2021 11:57 AM 6.9 (H) 4.6 - 6.5 % Final    Comment:    Glycemic Control Guidelines for People with Diabetes:Non Diabetic:  <6%Goal of Therapy: <7%Additional Action Suggested:  >8%     CBG: Recent Labs  Lab 06/28/24 1732 06/28/24 2158 06/28/24 2306 06/29/24 0729  GLUCAP 181* 162* 303* 251*    Review of Systems:   Please see the history of present illness. All other systems reviewed and are negative    Past Medical History:  He,  has a past medical history of Allergy, Diabetes mellitus, and Hepatitis B infection without delta agent without hepatic coma.   Surgical History:   Past Surgical History:  Procedure Laterality Date   BIOPSY  01/19/2020   Procedure: BIOPSY;  Surgeon: Aneita Gwendlyn DASEN, MD;  Location: [facility] ENDOSCOPY;  Service: Endoscopy;;   COLONOSCOPY WITH PROPOFOL  N/A 01/19/2020   Procedure: COLONOSCOPY WITH PROPOFOL ;   Surgeon: Aneita Gwendlyn DASEN, MD;  Location: [facility] ENDOSCOPY;  Service: Endoscopy;  Laterality: N/A;   ESOPHAGOGASTRODUODENOSCOPY (EGD) WITH PROPOFOL  N/A 01/19/2020   Procedure: ESOPHAGOGASTRODUODENOSCOPY (EGD) WITH PROPOFOL ;  Surgeon: Aneita Gwendlyn DASEN, MD;  Location: [facility] ENDOSCOPY;  Service: Endoscopy;  Laterality: N/A;   HOT HEMOSTASIS N/A 01/19/2020   Procedure: HOT HEMOSTASIS (ARGON PLASMA COAGULATION/BICAP);  Surgeon: Aneita Gwendlyn DASEN, MD;  Location: [facility] ENDOSCOPY;  Service: Endoscopy;  Laterality: N/A;   ORIF ANKLE FRACTURE Right 11/13/2021   Procedure: OPEN REDUCTION INTERNAL FIXATION (ORIF) ANKLE FRACTURE;  Surgeon: Kendal Franky SQUIBB, MD;  Location: [facility] OR;  Service: Orthopedics;  Laterality: Right;   ORIF CLAVICULAR FRACTURE Left 11/13/2021   Procedure: OPEN REDUCTION INTERNAL FIXATION (ORIF) CLAVICULAR FRACTURE;  Surgeon: Kendal Franky SQUIBB, MD;  Location: [facility] OR;  Service: Orthopedics;  Laterality: Left;   POLYPECTOMY  01/19/2020   Procedure: POLYPECTOMY;  Surgeon: Aneita Gwendlyn DASEN, MD;  Location: [facility] ENDOSCOPY;  Service: Endoscopy;;     Social History:   reports that he has never smoked. He  has never used smokeless tobacco. He reports that he does not drink alcohol and does not use drugs.   Family History:  His family history is negative for Colon cancer.   Allergies No Known Allergies   Home Medications  Prior to Admission medications   Medication Sig Start Date End Date Taking? Authorizing Provider  ACCU-CHEK GUIDE TEST test strip 1 EACH BY IN VITRO ROUTE IN THE MORNING, AT NOON, AND AT BEDTIME. MAY SUBSTITUTE TO ANY MANUFACTURER COVERED BY PATIENT'S INSURANCE. 01/16/24   Theophilus Andrews, Tully GRADE, MD  Accu-Chek Softclix Lancets lancets USE AS DIRECTED 4 TIMES A DAY 06/17/21   Theophilus Andrews, Tully GRADE, MD  acetaminophen  (TYLENOL ) 650 MG CR tablet Take 650 mg by mouth every 8 (eight) hours as needed for pain.    [provider]  cetirizine  (ZYRTEC ) 10 MG tablet Take 1 tablet (10 mg total)  by mouth daily. 01/22/20   Theophilus Andrews Tully GRADE, MD  JARDIANCE  10 MG TABS tablet TAKE 1 TABLET BY MOUTH DAILY BEFORE BREAKFAST. 06/12/24   Theophilus Andrews, Tully GRADE, MD  methocarbamol  (ROBAXIN ) 500 MG tablet Take 1 tablet (500 mg total) by mouth every 6 (six) hours as needed for muscle spasms. 11/16/21   Danton Lauraine LABOR, PA-C  Multiple Vitamin (MULTIVITAMIN WITH MINERALS) TABS tablet Take 1 tablet by mouth daily. Centrum    [provider]  Oxycodone  HCl 10 MG TABS Take 0.5-1 tablets (5-10 mg total) by mouth every 4 (four) hours as needed (5mg  for moderate pain, 10mg  for severe pain). 11/16/21   Danton Lauraine LABOR, PA-C  polyethylene glycol (MIRALAX  / GLYCOLAX ) 17 g packet Take 17 g by mouth daily as needed for mild constipation. 11/16/21   Danton Lauraine LABOR, PA-C  Semaglutide ,0.25 or 0.5MG /DOS, (OZEMPIC , 0.25 OR 0.5 MG/DOSE,) 2 MG/3ML SOPN Inject 0.25 mg once a week for the first 4 weeks and then increase to 0.5 mg weekly. 03/21/24   Theophilus Andrews, Tully GRADE, MD  sildenafil  (VIAGRA ) 100 MG tablet Take 0.5-1 tablets (50-100 mg total) by mouth daily as needed for erectile dysfunction. 02/25/21   Theophilus Andrews Tully GRADE, MD     Critical care time:   CRITICAL CARE Performed by: Lindsay Soulliere D. Harris   Total critical care time: 40 minutes  Critical care time was exclusive of separately billable procedures and treating other patients.  Critical care was necessary to treat or prevent imminent or life-threatening deterioration.  Critical care was time spent personally by me on the following activities: development of treatment plan with patient and/or surrogate as well as nursing, discussions with consultants, evaluation of patient's response to treatment, examination of patient, obtaining history from patient or surrogate, ordering and performing treatments and interventions, ordering and review of laboratory studies, ordering and review of radiographic studies, pulse oximetry and  re-evaluation of patient's condition.  Yasmyn Bellisario D. [place], NP-C [facility] Personal contact information can be found on Amion  If no contact or response made please call [phone] 06/29/2024, 1:58 PM

## 2024-06-29 NOTE — Op Note (Signed)
 [facility] Patient Name: Christian Vance Procedure Date : 06/29/2024 MRN: [id number] Attending MD: Sandor Flatter , MD, [id number] Date of Birth: 1961-07-05 CSN: [id number] Age: 63 Admit Type: Inpatient Procedure:                Colonoscopy Indications:              Hematochezia, Acute post hemorrhagic anemia Providers:                Sandor Flatter, MD, Ozell Pouch, Lorrayne Kitty,                            Technician Referring MD:              Medicines:                Monitored Anesthesia Care Complications:            No immediate complications. Estimated Blood Loss:     Estimated blood loss: none. Procedure:                Pre-Anesthesia Assessment:                           - Prior to the procedure, a History and Physical                            was performed, and patient medications and                            allergies were reviewed. The patient's tolerance of                            previous anesthesia was also reviewed. The risks                            and benefits of the procedure and the sedation                            options and risks were discussed with the patient.                            All questions were answered, and informed consent                            was obtained. Prior Anticoagulants: The patient has                            taken no anticoagulant or antiplatelet agents. ASA                            Grade Assessment: III - A patient with severe                            systemic disease. After reviewing the risks and  benefits, the patient was deemed in satisfactory                            condition to undergo the procedure.                           After obtaining informed consent, the colonoscope                            was passed under direct vision. Throughout the                            procedure, the patient's blood pressure, pulse, and                            oxygen  saturations were monitored continuously. The                            CF-HQ190L ([id number] Olympus colonoscopy was                            introduced through the anus and advanced to the the                            cecum, identified by appendiceal orifice and                            ileocecal valve. The colonoscopy was performed                            without difficulty. The patient tolerated the                            procedure well. The quality of the bowel                            preparation was adequate. The ileocecal valve,                            appendiceal orifice, and rectum were photographed. Scope In: 11:04:59 AM Scope Out: 11:57:12 AM Scope Withdrawal Time: 0 hours 8 minutes 14 seconds  Total Procedure Duration: 0 hours 52 minutes 13 seconds  Findings:      The perianal and digital rectal examinations were normal.      A 5 mm polyp with a black speckled apperance (which did not lavage off)       was found in the ascending colon. The polyp was sessile. The polyp was       removed with a cold snare. Resection and retrieval were complete. For       hemostasis, one hemostatic clip was successfully placed (MR       conditional). Clip manufacturer: [employer]. There was no       bleeding at the end of the procedure.      A few sessile polyps were found in the rectum and recto-sigmoid colon.  The polyps were 1 to 2 mm in size.      There was a small mucosal disruption of uncertain etiology was found in       the sigmoid colon, located approximately 45 cm from the anal verge. This       measured 4 mm in length. For hemostasis, two hemostatic clips were       successfully placed (MR conditional). Clip manufacturer: [employer]. There was no bleeding at the end of the procedure.      Clotted blood was found in the entire colon. Lavage of the area was       performed using copious amounts of sterile water, resulting in clearance        with adequate visualization.      Multiple small-mouthed diverticula were found in the sigmoid colon,       descending colon and ascending colon. Each of the diverticula was       lavaged using copious amounts of sterile water, resulting in clearance       with adequate visualization.      Non-bleeding internal hemorrhoids were found during retroflexion. The       hemorrhoids were small. Impression:               - One 5 mm polyp in the ascending colon, removed                            with a cold snare. Resected and retrieved. Clip (MR                            conditional) was placed. Clip manufacturer: [employer].                           - A few 1 to 2 mm polyps in the rectum and at the                            recto-sigmoid colon. The location and appearance                            was consistent with benign hyperplastic polyps, and                            therefore these were not removed today given recent                            active bleed.                           - Superficial mucosal tear in the sigmoid colon.                            Clips (MR conditional) were placed. Clip                            manufacturer:  [employer].                           - Blood in the entire examined colon.                           - Diverticulosis in the sigmoid colon, in the                            descending colon and in the ascending colon.                           - Non-bleeding internal hemorrhoids.                           - At the conclusion of the procedure, I readvanced                            the colonoscope all the way to the cecum. The                            colonoscope was again slowly withdrawn with lavage                            of the entire mucosal surface. Clots and old heme                            removed, but no active bleeding nor high-grade                            stigmata of bleeding  noted. Recommendation:           - Return patient to ICU for ongoing care.                           - Clear liquid diet.                           - Continue present medications.                           - Await pathology results.                           - Continue serial CBC checks with blood products as                            needed per protocol.                           - Patient did require norepinephrine  for BP support                            during the procedure. Plan for continued/ongoing  vasopressor support per ICU.                           - If acute rebleed, plan for stat CT angiography,                            and if again negative, rapid purge prep. Procedure Code(s):        --- Professional ---                           [phone], 59, Colonoscopy, flexible; with control of                            bleeding, any method                           45385, Colonoscopy, flexible; with removal of                            tumor(s), polyp(s), or other lesion(s) by snare                            technique Diagnosis Code(s):        --- Professional ---                           D12.2, Benign neoplasm of ascending colon                           D12.8, Benign neoplasm of rectum                           D12.7, Benign neoplasm of rectosigmoid junction                           S36.593A, Other injury of sigmoid colon, initial                            encounter                           K92.2, Gastrointestinal hemorrhage, unspecified                           K64.8, Other hemorrhoids                           K92.1, Melena (includes Hematochezia)                           D62, Acute posthemorrhagic anemia                           K57.30, Diverticulosis of large intestine without                            perforation or abscess without bleeding CPT copyright 2022 [employer]. All  rights reserved. The codes documented in this  report are preliminary and upon coder review may  be revised to meet current compliance requirements. Sandor Flatter, MD 06/29/2024 12:11:57 PM Number of Addenda: 0

## 2024-06-30 ENCOUNTER — Encounter (HOSPITAL_COMMUNITY): Admission: EM | Disposition: A | Payer: Self-pay | Source: Ambulatory Visit | Attending: Internal Medicine

## 2024-06-30 ENCOUNTER — Inpatient Hospital Stay (HOSPITAL_COMMUNITY)

## 2024-06-30 ENCOUNTER — Encounter (HOSPITAL_COMMUNITY): Payer: Self-pay | Admitting: Emergency Medicine

## 2024-06-30 DIAGNOSIS — K5730 Diverticulosis of large intestine without perforation or abscess without bleeding: Secondary | ICD-10-CM | POA: Diagnosis not present

## 2024-06-30 DIAGNOSIS — I959 Hypotension, unspecified: Secondary | ICD-10-CM

## 2024-06-30 DIAGNOSIS — K921 Melena: Secondary | ICD-10-CM

## 2024-06-30 DIAGNOSIS — Z7984 Long term (current) use of oral hypoglycemic drugs: Secondary | ICD-10-CM

## 2024-06-30 DIAGNOSIS — E119 Type 2 diabetes mellitus without complications: Secondary | ICD-10-CM

## 2024-06-30 DIAGNOSIS — D122 Benign neoplasm of ascending colon: Secondary | ICD-10-CM | POA: Diagnosis not present

## 2024-06-30 DIAGNOSIS — K562 Volvulus: Secondary | ICD-10-CM | POA: Diagnosis not present

## 2024-06-30 DIAGNOSIS — D62 Acute posthemorrhagic anemia: Secondary | ICD-10-CM | POA: Diagnosis not present

## 2024-06-30 DIAGNOSIS — D127 Benign neoplasm of rectosigmoid junction: Secondary | ICD-10-CM | POA: Diagnosis not present

## 2024-06-30 DIAGNOSIS — D128 Benign neoplasm of rectum: Secondary | ICD-10-CM | POA: Diagnosis not present

## 2024-06-30 HISTORY — PX: COLONOSCOPY: SHX5424

## 2024-06-30 HISTORY — PX: ESOPHAGOGASTRODUODENOSCOPY: SHX5428

## 2024-06-30 HISTORY — PX: SMART PILL PROCEDURE: SHX6496

## 2024-06-30 LAB — BASIC METABOLIC PANEL WITH GFR
Anion gap: 6 (ref 5–15)
BUN: 9 mg/dL (ref 8–23)
CO2: 22 mmol/L (ref 22–32)
Calcium: 7.4 mg/dL — ABNORMAL LOW (ref 8.9–10.3)
Chloride: 113 mmol/L — ABNORMAL HIGH (ref 98–111)
Creatinine, Ser: 0.86 mg/dL (ref 0.61–1.24)
GFR, Estimated: >60 mL/min (ref >60)
Glucose, Bld: 122 mg/dL — ABNORMAL HIGH (ref 70–99)
Potassium: 3.5 mmol/L (ref 3.5–5.1)
Sodium: 141 mmol/L (ref 135–145)

## 2024-06-30 LAB — CBC WITH DIFFERENTIAL/PLATELET
Abs Immature Granulocytes: 0.07 K/uL (ref 0.00–0.07)
Basophils Absolute: 0 K/uL (ref 0.0–0.1)
Basophils Relative: 0 %
Eosinophils Absolute: 0.1 K/uL (ref 0.0–0.5)
Eosinophils Relative: 1 %
HCT: 22.2 % — ABNORMAL LOW (ref 39.0–52.0)
Hemoglobin: 7.5 g/dL — ABNORMAL LOW (ref 13.0–17.0)
Immature Granulocytes: 1 %
Lymphocytes Relative: 24 %
Lymphs Abs: 3.4 K/uL (ref 0.7–4.0)
MCH: 31.1 pg (ref 26.0–34.0)
MCHC: 33.8 g/dL (ref 30.0–36.0)
MCV: 92.1 fL (ref 80.0–100.0)
Monocytes Absolute: 0.9 K/uL (ref 0.1–1.0)
Monocytes Relative: 6 %
Neutro Abs: 9.6 K/uL — ABNORMAL HIGH (ref 1.7–7.7)
Neutrophils Relative %: 68 %
Platelets: 100 K/uL — ABNORMAL LOW (ref 150–400)
RBC: 2.41 MIL/uL — ABNORMAL LOW (ref 4.22–5.81)
RDW: 16.1 % — ABNORMAL HIGH (ref 11.5–15.5)
WBC: 14 K/uL — ABNORMAL HIGH (ref 4.0–10.5)
nRBC: 0 % (ref 0.0–0.2)

## 2024-06-30 LAB — HEMOGLOBIN AND HEMATOCRIT, BLOOD
HCT: 18.7 % — ABNORMAL LOW (ref 39.0–52.0)
HCT: 18.5 % — ABNORMAL LOW (ref 39.0–52.0)
HCT: 15 % — ABNORMAL LOW (ref 39.0–52.0)
HCT: 25.3 % — ABNORMAL LOW (ref 39.0–52.0)
Hemoglobin: 6.2 g/dL — CL (ref 13.0–17.0)
Hemoglobin: 6.3 g/dL — CL (ref 13.0–17.0)
Hemoglobin: 5.1 g/dL — CL (ref 13.0–17.0)
Hemoglobin: 8.9 g/dL — ABNORMAL LOW (ref 13.0–17.0)

## 2024-06-30 LAB — GLUCOSE, CAPILLARY
Glucose-Capillary: 128 mg/dL — ABNORMAL HIGH (ref 70–99)
Glucose-Capillary: 174 mg/dL — ABNORMAL HIGH (ref 70–99)
Glucose-Capillary: 219 mg/dL — ABNORMAL HIGH (ref 70–99)
Glucose-Capillary: 247 mg/dL — ABNORMAL HIGH (ref 70–99)
Glucose-Capillary: 256 mg/dL — ABNORMAL HIGH (ref 70–99)
Glucose-Capillary: 283 mg/dL — ABNORMAL HIGH (ref 70–99)
Glucose-Capillary: 85 mg/dL (ref 70–99)

## 2024-06-30 LAB — PREPARE RBC (CROSSMATCH)

## 2024-06-30 LAB — MAGNESIUM: Magnesium: 1.6 mg/dL — ABNORMAL LOW (ref 1.7–2.4)

## 2024-06-30 MED ORDER — LACTATED RINGERS IV BOLUS
500.0000 mL | Freq: Once | INTRAVENOUS | Status: AC
  Administered 2024-06-30: 500 mL via INTRAVENOUS

## 2024-06-30 MED ORDER — SODIUM CHLORIDE 0.9% IV SOLUTION: Freq: Once | INTRAVENOUS | Status: DC

## 2024-06-30 MED ORDER — MAGNESIUM SULFATE 4 GM/100ML IV SOLN
4.0000 g | Freq: Once | INTRAVENOUS | Status: AC
  Administered 2024-06-30: 4 g via INTRAVENOUS
  Filled 2024-06-30: qty 100

## 2024-06-30 MED ORDER — NOREPINEPHRINE 4 MG/250ML-% IV SOLN
0.0000 ug/min | INTRAVENOUS | Status: DC
  Administered 2024-07-01 – 2024-07-02 (×3): 4 ug/min via INTRAVENOUS
  Filled 2024-06-30 (×2): qty 250

## 2024-06-30 MED ORDER — SODIUM CHLORIDE 0.9 % IV SOLN: INTRAVENOUS | Status: DC | PRN

## 2024-06-30 MED ORDER — MIDODRINE HCL 5 MG PO TABS
15.0000 mg | ORAL_TABLET | Freq: Three times a day (TID) | ORAL | Status: DC
  Administered 2024-06-30 – 2024-07-03 (×6): 15 mg via ORAL
  Filled 2024-06-30 (×6): qty 3

## 2024-06-30 MED ORDER — SODIUM CHLORIDE 0.9 % IV SOLN: INTRAVENOUS | Status: DC

## 2024-06-30 MED ORDER — ALBUMIN HUMAN 5 % IV SOLN: INTRAVENOUS | Status: DC | PRN

## 2024-06-30 MED ORDER — PROPOFOL 10 MG/ML IV BOLUS
INTRAVENOUS | Status: DC | PRN
  Administered 2024-06-30: 50 ug via INTRAVENOUS
  Administered 2024-06-30: 30 ug via INTRAVENOUS

## 2024-06-30 MED ORDER — CALCIUM GLUCONATE-NACL 2-0.675 GM/100ML-% IV SOLN
2.0000 g | Freq: Once | INTRAVENOUS | Status: AC
  Administered 2024-06-30: 2000 mg via INTRAVENOUS
  Filled 2024-06-30: qty 100

## 2024-06-30 MED ORDER — VASOPRESSIN 20 UNIT/ML IV SOLN
INTRAVENOUS | Status: DC | PRN
  Administered 2024-06-30: 1 [IU] via INTRAVENOUS
  Administered 2024-06-30 (×3): 2 [IU] via INTRAVENOUS

## 2024-06-30 MED ORDER — SODIUM CHLORIDE 0.9% IV SOLUTION: Freq: Once | INTRAVENOUS | Status: AC

## 2024-06-30 MED ORDER — INSULIN ASPART 100 UNIT/ML IJ SOLN
0.0000 [IU] | INTRAMUSCULAR | Status: DC
  Administered 2024-06-30: 3 [IU] via SUBCUTANEOUS
  Administered 2024-06-30: 5 [IU] via SUBCUTANEOUS
  Administered 2024-06-30: 3 [IU] via SUBCUTANEOUS
  Administered 2024-06-30: 2 [IU] via SUBCUTANEOUS
  Administered 2024-06-30: 5 [IU] via SUBCUTANEOUS
  Administered 2024-07-01: 3 [IU] via SUBCUTANEOUS
  Administered 2024-07-01: 2 [IU] via SUBCUTANEOUS
  Administered 2024-07-01: 3 [IU] via SUBCUTANEOUS
  Administered 2024-07-01 (×3): 2 [IU] via SUBCUTANEOUS
  Administered 2024-07-02 (×2): 1 [IU] via SUBCUTANEOUS
  Administered 2024-07-02: 2 [IU] via SUBCUTANEOUS
  Administered 2024-07-02: 1 [IU] via SUBCUTANEOUS
  Administered 2024-07-03: 7 [IU] via SUBCUTANEOUS
  Administered 2024-07-03: 1 [IU] via SUBCUTANEOUS
  Administered 2024-07-03: 2 [IU] via SUBCUTANEOUS
  Administered 2024-07-03: 1 [IU] via SUBCUTANEOUS
  Administered 2024-07-04: 2 [IU] via SUBCUTANEOUS
  Administered 2024-07-04 (×2): 3 [IU] via SUBCUTANEOUS
  Filled 2024-06-30: qty 5
  Filled 2024-06-30: qty 1
  Filled 2024-06-30: qty 2
  Filled 2024-06-30: qty 3
  Filled 2024-06-30: qty 2
  Filled 2024-06-30: qty 3
  Filled 2024-06-30: qty 7
  Filled 2024-06-30 (×2): qty 3
  Filled 2024-06-30: qty 2
  Filled 2024-06-30: qty 1
  Filled 2024-06-30: qty 5
  Filled 2024-06-30 (×2): qty 1
  Filled 2024-06-30: qty 3
  Filled 2024-06-30 (×2): qty 2
  Filled 2024-06-30: qty 3
  Filled 2024-06-30 (×2): qty 2

## 2024-06-30 MED ORDER — PROPOFOL 500 MG/50ML IV EMUL
INTRAVENOUS | Status: DC | PRN
  Administered 2024-06-30: 75 ug/kg/min via INTRAVENOUS
  Administered 2024-06-30: 120 ug/kg/min via INTRAVENOUS

## 2024-06-30 MED ORDER — POTASSIUM CHLORIDE 20 MEQ PO PACK
40.0000 meq | PACK | Freq: Once | ORAL | Status: AC
  Administered 2024-06-30: 40 meq via ORAL
  Filled 2024-06-30: qty 2

## 2024-06-30 NOTE — Progress Notes (Addendum)
 eLink Physician-Brief Progress Note Patient Name: Christian Vance DOB: 02-Mar-1961 MRN: [id number]   Date of Service  06/30/2024  HPI/Events of Note  Patient got up from the bed and had a syncopal episode with blood pressures down to the 50s.  Norepinephrine  rapidly escalated and the patient returned back to the bed after having bright red bloody bowel movement.  Mentation back to normal and blood pressures improving  eICU Interventions  Suspicious for orthostatic hypotension in the setting of acute blood loss with ongoing bleeding.  1 unit PRBC transfusion, 500 cc LR bolus, and trend hemoglobins for 24 hours, electrolyte replacement   0509 - Hg 6.2, transfusion pending  Intervention Category Intermediate Interventions: Hypotension - evaluation and management  Jaykob Minichiello 06/30/2024, 3:42 AM

## 2024-06-30 NOTE — H&P (View-Only) (Signed)
 [facility] ROUNDING NOTE   Subjective: Episode of hypotension with syncopal episode overnight with SBP in the 50s.  Treated with IV fluids and ordered for 1 unit RBCs.  Currently on levo.  No bleeding overnight, but repeat H/H 7.5/22 from 9.3/28.  Mentating well.   Objective: Vital signs in last 24 hours: Temp:  [98.2 F (36.8 C)-100.7 F (38.2 C)] 100.4 F (38 C) (11/22 0753) Pulse Rate:  [84-126] 107 (11/22 0915) Resp:  [9-27] 9 (11/22 0915) BP: (50-123)/(36-84) 86/67 (11/22 0915) SpO2:  [92 %-100 %] 95 % (11/22 0915) Weight:  [81.6 kg] 81.6 kg (11/21 1036) Last BM Date : 06/30/24 General: NAD Lungs:  CTA b/l, no w/r/r Heart:  RRR, no m/r/g Abdomen:  Soft, NT, ND, +BS Ext:  No c/c/e    Intake/Output from previous day: 11/21 0701 - 11/22 0700 In: 2123.3 [I.V.:915.6; Blood:590; IV Piggyback:617.7] Out: 500 [Urine:500] Intake/Output this shift: No intake/output data recorded.   Lab Results: Recent Labs    06/29/24 0631 06/29/24 1251 06/30/24 0148 06/30/24 0423  WBC 16.2* 17.5* 14.0*  --   HGB 9.2* 9.3* 7.5* 6.2*  PLT 109* 104* 100*  --   MCV 91.6 92.4 92.1  --    BMET Recent Labs    06/29/24 0405 06/29/24 1251 06/30/24 0148  NA 138 140 141  K 4.3 3.9 3.5  CL 111 115* 113*  CO2 16* 19* 22  GLUCOSE 264* 150* 122*  BUN 11 10 9   CREATININE 1.07 0.87 0.86  CALCIUM  7.4* 7.2* 7.4*   LFT Recent Labs    06/28/24 1320 06/29/24 0405  PROT 5.1* 4.6*  ALBUMIN  3.0* 2.5*  AST 16 18  ALT 15 14  ALKPHOS 64 45  BILITOT 0.3 0.7   PT/INR Recent Labs    06/29/24 0405  INR 1.2      Imaging/Other results: CT Angio Abd/Pel w/ and/or w/o Result Date: 06/29/2024 EXAM: CTA ABDOMEN AND PELVIS WITHOUT AND WITH CONTRAST 06/29/2024 12:52:21 AM TECHNIQUE: CTA images of the abdomen and pelvis without and with intravenous contrast. 75 mL of iohexol  (OMNIPAQUE ) 350 MG/ML injection was administered. Three-dimensional MIP/volume rendered formations were  performed. Automated exposure control, iterative reconstruction, and/or weight based adjustment of the mA/kV was utilized to reduce the radiation dose to as low as reasonably achievable. COMPARISON: 06/28/2024 CLINICAL HISTORY: Lower GI bleed FINDINGS: VASCULATURE: AORTA: No acute finding. No abdominal aortic aneurysm. No dissection. CELIAC TRUNK: No acute finding. No occlusion or significant stenosis. SUPERIOR MESENTERIC ARTERY: No acute finding. No occlusion or significant stenosis. RENAL ARTERIES: No acute finding. No occlusion or significant stenosis. ILIAC ARTERIES: No acute finding. No occlusion or significant stenosis. LIVER: The liver is unremarkable. GALLBLADDER AND BILE DUCTS: Gallbladder is unremarkable. No biliary ductal dilatation. SPLEEN: The spleen is unremarkable. PANCREAS: The pancreas is unremarkable. ADRENAL GLANDS: Bilateral adrenal glands demonstrate no acute abnormality. KIDNEYS, URETERS AND BLADDER: No stones in the kidneys or ureters. No hydronephrosis. No perinephric or periureteral stranding. Urinary bladder is unremarkable. GI AND BOWEL: Interval fluid distention of the stomach. Mild pancolonic diverticulosis without superimposed acute inflammatory change. No active gastrointestinal hemorrhage. The stomach, small bowel, and large bowel are otherwise unremarkable. Appendix absent. There is no bowel obstruction. No abnormal bowel wall thickening or distension. REPRODUCTIVE: Moderate prostatic hypertrophy. PERITONEUM AND RETRPERITONEUM: No ascites or free air. LUNG BASE: No acute abnormality. LYMPH NODES: No lymphadenopathy. BONES AND SOFT TISSUES: T10 superior endplate fracture again noted. No acute soft tissue abnormality. IMPRESSION: 1. No active gastrointestinal hemorrhage. 2.  Mild pancolonic diverticulosis without superimposed acute inflammatory change. 3. Interval fluid distention of the stomach, nonspecific. 4. Moderate prostatic hypertrophy. 5. Unchanged T10 superior endplate  fracture. Electronically signed by: Dorethia Molt MD 06/29/2024 01:26 AM EST RP Workstation: [id number]   CT Angio Abd/Pel W and/or Wo Contrast Result Date: 06/28/2024 CLINICAL DATA:  Possible lower GI bleed. Dark red blood in stool since this morning. Five episodes. Dizziness. EXAM: CTA ABDOMEN AND PELVIS WITHOUT AND WITH CONTRAST TECHNIQUE: Multidetector CT imaging of the abdomen and pelvis was performed using the standard protocol during bolus administration of intravenous contrast. Multiplanar reconstructed images and MIPs were obtained and reviewed to evaluate the vascular anatomy. RADIATION DOSE REDUCTION: This exam was performed according to the departmental dose-optimization program which includes automated exposure control, adjustment of the mA and/or kV according to patient size and/or use of iterative reconstruction technique. CONTRAST:  85mL OMNIPAQUE  IOHEXOL  350 MG/ML SOLN COMPARISON:  11/11/2021 FINDINGS: VASCULAR Aorta: Normal caliber aorta without aneurysm, dissection, vasculitis or significant stenosis. Celiac: Patent without evidence of aneurysm, dissection, vasculitis or significant stenosis. SMA: Patent without evidence of aneurysm, dissection, vasculitis or significant stenosis. Renals: Both renal arteries are patent without evidence of aneurysm, dissection, vasculitis, fibromuscular dysplasia or significant stenosis. IMA: Patent without evidence of aneurysm, dissection, vasculitis or significant stenosis. Inflow: Patent without evidence of aneurysm, dissection, vasculitis or significant stenosis. Proximal Outflow: Bilateral common femoral and visualized portions of the superficial and profunda femoral arteries are patent without evidence of aneurysm, dissection, vasculitis or significant stenosis. Veins: No obvious venous abnormality within the limitations of this arterial phase study. Review of the MIP images confirms the above findings. NON-VASCULAR Lower chest: Heart is normal size.   Visualized lung bases are clear. Hepatobiliary: Liver, gallbladder and biliary tree are normal. Pancreas: Normal. Spleen: Normal. Adrenals/Urinary Tract: Adrenal glands are normal. Kidneys are normal in size without hydronephrosis or nephrolithiasis. Kidneys are otherwise unchanged. Ureters and bladder are normal. Stomach/Bowel: Stomach and small bowel are normal. Appendix is normal. Minimal diverticulosis of the colon. Lymphatic: No adenopathy. Reproductive: Prostate is unremarkable. Other: No significant free peritoneal fluid or focal inflammatory change. Musculoskeletal: Minimal loss of mid vertebral body height of T10 with small associated Schmorl's node new since the prior exam. IMPRESSION: 1. No acute findings in the abdomen/pelvis. No evidence of active GI bleed. 2. Minimal colonic diverticulosis. Electronically Signed   By: Toribio Agreste M.D.   On: 06/28/2024 13:59      Assessment and Plan:  1) Hematochezia 2) Acute blood loss anemia 3) Symptomatic anemia 4) Hypotension 5) Diverticulosis Colonoscopy on 11/21 with diverticulosis in the sigmoid, descending, ascending colon, with old blood and clots throughout the colon, but no active bleeding noted after extensive lavage.  1 polyp removed and polypectomy site clipped closed.  No overt bleeding overnight, but H/H declined to 7.5/23 today and did have hypotension with syncope overnight.  Since he is not overtly bleeding, I do not think a repeat CTA will show any source.  Instead, plan for the following: - EGD today - If EGD negative, plan for endoscopic placement of video capsule for further small bowel interrogation along with repeat colonoscopy without prep - If the studies are unremarkable, curious about atypical source like a Meckel's bleed? - BP management per [facility] - Transfuse 1 unit RBCs - Will need MAC assistance for sedation    Sandor LULLA Flatter, DO  06/30/2024, 9:48 AM [facility] Pager [phone]

## 2024-06-30 NOTE — Progress Notes (Addendum)
 NAME:  Christian Vance, MRN:  [id number], DOB:  02-16-1961, LOS: 2 ADMISSION DATE:  06/28/2024, CONSULTATION DATE:  06/29/24 REFERRING MD:  TRH, CHIEF COMPLAINT:  GI bleed   History of Present Illness:  Christian Vance is a 63 year old male with a past medical history significant for previous GI bleed in 2021 felt secondary to diverticulosis, AVM and polyps, type 2 diabetes, and hepatitis B who presented to the ED at [facility] 11/20 for complaints of rectal bleeding.  Given concerns for active lower GI bleed patient was admitted per hospitalist.   A.m. 11/21 patient was seen hypotensive with several episodes of dark red bowel movements with clots indicating ongoing lower GI bleed.  Patient received 2 units PRBC.  Critical care and GI consulted.  Pertinent  Medical History  Previous GI bleed secondary to diverticulosis Diabetes Hepatitis B  Significant Hospital Events: Including procedures, antibiotic start and stop dates in addition to other pertinent events   11/20 presented with signs of lower GI bleed but remained hemodynamically stable 11/21 became hemodynamically unstable with active lower GI bleed this a.m. received units PRBC transferred to ICU for monitoring Underwent endoscopy which revealed no signs of active bleeding but did reveal large amount of old blood and clots throughout the colon, diverticulosis and required 1 polyp removal.  Interim History / Subjective:  Did have an hypotensive, syncopal episode during the night with blood pressures going as low as the 50s Fluid resuscitated Ordered 1 unit of blood  Objective    Blood pressure (!) 92/49, pulse (!) 102, temperature 100.3 F (37.9 C), temperature source Oral, resp. rate 17, height 5' 10 (1.778 m), weight 81.6 kg, SpO2 93%.        Intake/Output Summary (Last 24 hours) at 06/30/2024 0737 Last data filed at 06/30/2024 0700 Gross per 24 hour  Intake 2123.34 ml  Output 500 ml  Net 1623.34 ml   Filed Weights    06/29/24 1036  Weight: 81.6 kg    Examination: General: Elderly, does not appear to be in acute distress HENT: Moist oral mucosa Lungs: Clear breath sounds bilaterally Cardiovascular: S1-S2 appreciated Abdomen: Soft, bowel sounds appreciated Extremities: No clubbing, no edema Neuro: Awake alert oriented x 3 GU: Nonfocal  I reviewed last 24 h vitals and pain scores, last 48 h intake and output, last 24 h labs and trends, and last 24 h imaging results.  H&H was 6.2/18.7 prior to receiving 1 unit  Has received 3 units in total during this admission  Resolved problem list   Assessment and Plan   Lower GI bleed with negative colonoscopy for ongoing bleeding - Did have evidence of old blood - Polyp removed - Appears that he is still actively bleeding - Will need CT angio  Acute blood loss anemia Thrombocytopenia - Continue to trend - Replete  Hypotension Hemorrhagic shock - On Levophed  - On midodrine  - Maintain MAP greater than 65  Type 2 diabetes - Continue CBG checks - CBG goal of 140-180  Chronic hepatitis B - Supportive care  IR evaluation and treatment ordered - CT angio GI bleed ordered   Labs   CBC: Recent Labs  Lab 06/28/24 2327 06/29/24 0405 06/29/24 0631 06/29/24 1251 06/30/24 0148 06/30/24 0423  WBC 9.7 16.1* 16.2* 17.5* 14.0*  --   NEUTROABS  --   --   --   --  9.6*  --   HGB 8.7* 10.3* 9.2* 9.3* 7.5* 6.2*  HCT 26.9* 31.1* 26.3* 27.9* 22.2* 18.7*  MCV 97.5 95.7 91.6 92.4 92.1  --   PLT 117* 113* 109* 104* 100*  --     Basic Metabolic Panel: Recent Labs  Lab 06/28/24 1320 06/29/24 0405 06/29/24 1251 06/30/24 0148  NA 134* 138 140 141  K 4.2 4.3 3.9 3.5  CL 105 111 115* 113*  CO2 21* 16* 19* 22  GLUCOSE 227* 264* 150* 122*  BUN 14 11 10 9   CREATININE 0.68 1.07 0.87 0.86  CALCIUM  7.7* 7.4* 7.2* 7.4*  MG  --   --   --  1.6*   GFR: Estimated Creatinine Clearance: 88.4 mL/min (by C-G formula based on SCr of 0.86 mg/dL). Recent  Labs  Lab 06/29/24 0405 06/29/24 0631 06/29/24 1251 06/30/24 0148  WBC 16.1* 16.2* 17.5* 14.0*  LATICACIDVEN  --  3.7* 2.9*  --     Liver Function Tests: Recent Labs  Lab 06/28/24 1320 06/29/24 0405  AST 16 18  ALT 15 14  ALKPHOS 64 45  BILITOT 0.3 0.7  PROT 5.1* 4.6*  ALBUMIN  3.0* 2.5*   Recent Labs  Lab 06/28/24 1320  LIPASE 43   No results for input(s): AMMONIA in the last 168 hours.  ABG No results found for: PHART, PCO2ART, PO2ART, HCO3, TCO2, ACIDBASEDEF, O2SAT   Coagulation Profile: Recent Labs  Lab 06/29/24 0405  INR 1.2    Cardiac Enzymes: No results for input(s): CKTOTAL, CKMB, CKMBINDEX, TROPONINI in the last 168 hours.  HbA1C: Hemoglobin A1C  Date/Time Value Ref Range Status  03/21/2024 10:04 AM 9.0 (A) 4.0 - 5.6 % Final  12/08/2023 02:41 PM 7.7 (A) 4.0 - 5.6 % Final   Hgb A1c MFr Bld  Date/Time Value Ref Range Status  08/24/2023 11:41 AM 8.1 (H) 4.6 - 6.5 % Final    Comment:    Glycemic Control Guidelines for People with Diabetes:Non Diabetic:  <6%Goal of Therapy: <7%Additional Action Suggested:  >8%   02/25/2021 11:57 AM 6.9 (H) 4.6 - 6.5 % Final    Comment:    Glycemic Control Guidelines for People with Diabetes:Non Diabetic:  <6%Goal of Therapy: <7%Additional Action Suggested:  >8%     CBG: Recent Labs  Lab 06/29/24 1558 06/29/24 1937 06/29/24 2339 06/30/24 0002 06/30/24 0327  GLUCAP 120* 124* 69* 85 128*    Review of Systems:   Denies chest pain or chest discomfort Did have some headache during the night Orthostatic  Past Medical History:  He,  has a past medical history of Allergy, Diabetes mellitus, and Hepatitis B infection without delta agent without hepatic coma.   Surgical History:   Past Surgical History:  Procedure Laterality Date   BIOPSY  01/19/2020   Procedure: BIOPSY;  Surgeon: Aneita Gwendlyn DASEN, MD;  Location: [facility] ENDOSCOPY;  Service: Endoscopy;;   COLONOSCOPY WITH PROPOFOL  N/A  01/19/2020   Procedure: COLONOSCOPY WITH PROPOFOL ;  Surgeon: Aneita Gwendlyn DASEN, MD;  Location: [facility] ENDOSCOPY;  Service: Endoscopy;  Laterality: N/A;   ESOPHAGOGASTRODUODENOSCOPY (EGD) WITH PROPOFOL  N/A 01/19/2020   Procedure: ESOPHAGOGASTRODUODENOSCOPY (EGD) WITH PROPOFOL ;  Surgeon: Aneita Gwendlyn DASEN, MD;  Location: [facility] ENDOSCOPY;  Service: Endoscopy;  Laterality: N/A;   HOT HEMOSTASIS N/A 01/19/2020   Procedure: HOT HEMOSTASIS (ARGON PLASMA COAGULATION/BICAP);  Surgeon: Aneita Gwendlyn DASEN, MD;  Location: [facility] ENDOSCOPY;  Service: Endoscopy;  Laterality: N/A;   ORIF ANKLE FRACTURE Right 11/13/2021   Procedure: OPEN REDUCTION INTERNAL FIXATION (ORIF) ANKLE FRACTURE;  Surgeon: Kendal Franky SQUIBB, MD;  Location: [facility] OR;  Service: Orthopedics;  Laterality: Right;   ORIF CLAVICULAR  FRACTURE Left 11/13/2021   Procedure: OPEN REDUCTION INTERNAL FIXATION (ORIF) CLAVICULAR FRACTURE;  Surgeon: Kendal Franky SQUIBB, MD;  Location: [facility] OR;  Service: Orthopedics;  Laterality: Left;   POLYPECTOMY  01/19/2020   Procedure: POLYPECTOMY;  Surgeon: Aneita Gwendlyn DASEN, MD;  Location: [facility] ENDOSCOPY;  Service: Endoscopy;;     Social History:   reports that he has never smoked. He has never used smokeless tobacco. He reports that he does not drink alcohol and does not use drugs.   Family History:  His family history is negative for Colon cancer.   Allergies No Known Allergies   The patient is critically ill with multiple organ systems failure and requires high complexity decision making for assessment and support, frequent evaluation and titration of therapies, application of advanced monitoring technologies and extensive interpretation of multiple databases. Critical Care Time devoted to patient care services described in this note independent of APP/resident time (if applicable)  is 33 minutes.   Jennet Epley MD [facility] Personal pager: See Amion If unanswered, please page CCM On-call: #[phone]

## 2024-06-30 NOTE — Transfer of Care (Signed)
 Immediate Anesthesia Transfer of Care Note  Patient: Christian Vance  Procedure(s) Performed: EGD (ESOPHAGOGASTRODUODENOSCOPY) COLONOSCOPY CAPSULE ENDOSCOPY, USING SMARTPILL MOTILITY TESTING SYSTEM  Patient Location: ICU  Anesthesia Type:MAC  Level of Consciousness: ICUawake, alert , and oriented  Airway & Oxygen Therapy: Patient connected to nasal cannula oxygen  Post-op Assessment: Report given to RN and Post -op Vital signs reviewed and stable  Post vital signs: Reviewed and stable  Last Vitals:  Vitals Value Taken Time  BP    Temp    Pulse 95   Resp 16   SpO2 100     Last Pain:  Vitals:   06/30/24 1612  TempSrc: Temporal  PainSc: 0-No pain      Patients Stated Pain Goal: 0 (06/30/24 0800)  Complications: No notable events documented.

## 2024-06-30 NOTE — Op Note (Signed)
 [facility] Patient Name: Christian Vance Procedure Date : 06/30/2024 MRN: [id number] Attending MD: Sandor Flatter , MD, [id number] Date of Birth: February 23, 1961 CSN: [id number] Age: 63 Admit Type: Inpatient Procedure:                Colonoscopy Indications:              Hematochezia, Acute post hemorrhagic anemia Providers:                Sandor Flatter, MD, Collene Edu, RN, Fairy Marina, Technician Referring MD:              Medicines:                Monitored Anesthesia Care Complications:            No immediate complications. Estimated Blood Loss:     Estimated blood loss: none. Procedure:                Pre-Anesthesia Assessment:                           - Prior to the procedure, a History and Physical                            was performed, and patient medications and                            allergies were reviewed. The patient's tolerance of                            previous anesthesia was also reviewed. The risks                            and benefits of the procedure and the sedation                            options and risks were discussed with the patient.                            All questions were answered, and informed consent                            was obtained. Prior Anticoagulants: The patient has                            taken no anticoagulant or antiplatelet agents. ASA                            Grade Assessment: III - A patient with severe                            systemic disease. After reviewing the risks and  benefits, the patient was deemed in satisfactory                            condition to undergo the procedure.                           After obtaining informed consent, the colonoscope                            was passed under direct vision. Throughout the                            procedure, the patient's blood pressure, pulse, and                            oxygen  saturations were monitored continuously. The                            CF-HQ190L ([id number]) Olympus colonoscope was                            introduced through the anus and advanced to the the                            cecum, identified by appendiceal orifice and                            ileocecal valve. The colonoscopy was technically                            difficult and complex due to significant looping.                            Successful completion of the procedure was aided by                            straightening and shortening the scope to obtain                            bowel loop reduction and applying abdominal                            pressure. The patient tolerated the procedure well.                            The quality of the bowel preparation was adequate.                            The ileocecal valve, appendiceal orifice, and                            rectum were photographed. Scope In: 5:19:22 PM Scope Out: 6:04:35 PM Total Procedure Duration: 0 hours 45 minutes 13 seconds  Findings:      The  perianal and digital rectal examinations were normal.      Red blood and clotted blood was found in the entire colon. Lavage of the       area was performed using copious amounts of sterile water, resulting in       clearance with adequate visualization.      Multiple small-mouthed diverticula were found in the sigmoid colon,       descending colon and ascending colon. Lavage of the area was performed       using copious amounts of sterile water, resulting in clearance with       adequate visualization.      The ascending colon revealed significantly excessive looping. Advancing       the scope required using manual pressure and straightening and       shortening the scope to obtain bowel loop reduction.      A few sessile polyps were found in the rectum and recto-sigmoid colon.       The polyps were 1 to 2 mm in size. Impression:               - Blood in the  entire examined colon.                           - Diverticulosis in the sigmoid colon, in the                            descending colon and in the ascending colon.                           - There was significant looping of the colon.                           - A few 1 to 2 mm polyps in the rectum and at the                            recto-sigmoid colon. These were intentionally not                            removed.                           - 1 hemostatic clip was found in the ascending                            colon and 1 hemostatic clip was found in the                            sigmoid colon from yesterday's colonoscopy. These                            areas were lavaged and no sign of recent bleeding.                           There was blood and clots throughout the colon. The  colon was lavaged with a copious amount of sterile                            water with adequate visualization. After lavage and                            inspection of the entire colon, the colonoscope was                            then reintroduced back into the cecum for a second                            slow withdrawal and lavage. No active bleeding                            noted. No high-grade stigmata of recent bleed                            noted. Due to significant looping in the ascending                            colon, the terminal ileum could not be intubated. I                            spent several minutes of observing the ileocecal                            valve and no blood came from the valve. After                            lavage of the entire colon, no sign of active bleed                            was seen in the procedure was then concluded.                           Clinical presentation and endoscopic findings so                            far most consistent with diverticular bleed,                            although exact source of  bleeding not identified on                            colonoscopy x 2. Will await video capsule endoscopy                            results to evaluate for small bowel etiology. Has                            otherwise had CTA negative x 2. If  VCE unremarkable                            and recurrent bleed, can repeat stat CTA, but also                            query role/utility of Meckel scan. Recommendation:           - Return patient to ICU for ongoing care.                           - NPO for 2 hours, then clear liquids per video                            capsule protocol.                           - Continue close monitoring of serial CBCs with                            additional blood products as needed per protocol.                           - If concern for recurrent brisk bleeding, plan for                            CT angiography. Procedure Code(s):        --- Professional ---                           [phone], Colonoscopy, flexible; diagnostic, including                            collection of specimen(s) by brushing or washing,                            when performed (separate procedure) Diagnosis Code(s):        --- Professional ---                           K92.2, Gastrointestinal hemorrhage, unspecified                           D12.8, Benign neoplasm of rectum                           D12.7, Benign neoplasm of rectosigmoid junction                           K92.1, Melena (includes Hematochezia)                           D62, Acute posthemorrhagic anemia                           K57.30, Diverticulosis of large intestine without  perforation or abscess without bleeding CPT copyright 2022 American [employer] Association. All rights reserved. The codes documented in this report are preliminary and upon coder review may  be revised to meet current compliance requirements. Sandor Flatter, MD 06/30/2024 6:32:37 PM Number of Addenda: 0

## 2024-06-30 NOTE — Op Note (Signed)
 [facility] Patient Name: Christian Vance Procedure Date : 06/30/2024 MRN: [id number] Attending MD: Sandor Flatter , MD, [id number] Date of Birth: 1961/04/19 CSN: [id number] Age: 63 Admit Type: Inpatient Procedure:                Upper GI endoscopy Indications:              Acute post hemorrhagic anemia, Hematochezia Providers:                Sandor Flatter, MD, Collene Edu, RN, Fairy Marina, Technician Referring MD:              Medicines:                Monitored Anesthesia Care Complications:            No immediate complications. Estimated Blood Loss:     Estimated blood loss: none. Procedure:                Pre-Anesthesia Assessment:                           - Prior to the procedure, a History and Physical                            was performed, and patient medications and                            allergies were reviewed. The patient's tolerance of                            previous anesthesia was also reviewed. The risks                            and benefits of the procedure and the sedation                            options and risks were discussed with the patient.                            All questions were answered, and informed consent                            was obtained. Prior Anticoagulants: The patient has                            taken no anticoagulant or antiplatelet agents. ASA                            Grade Assessment: III - A patient with severe                            systemic disease. After reviewing the risks and  benefits, the patient was deemed in satisfactory                            condition to undergo the procedure.                           After obtaining informed consent, the endoscope was                            passed under direct vision. Throughout the                            procedure, the patient's blood pressure, pulse, and                             oxygen saturations were monitored continuously. The                            [id number]-[id number] ([id number]) Olympus endoscope was introduced                            through the mouth, and advanced to the third part                            of duodenum. The upper GI endoscopy was                            accomplished without difficulty. The patient                            tolerated the procedure well. Scope In: Scope Out: Findings:      The examined esophagus was normal.      The entire examined stomach was normal.      The examined duodenum was normal. Using the endoscope, the video capsule       enteroscope was advanced into the pylorus. Impression:               - Normal esophagus.                           - Normal stomach.                           - Normal examined duodenum.                           - Successful completion of the Video Capsule                            Enteroscope placement.                           - No specimens collected. Recommendation:           - Perform a colonoscopy today.                           -  Additional recommendations pending colonoscopy                            and video capsule endoscopy findings. Procedure Code(s):        --- Professional ---                           [phone], Esophagogastroduodenoscopy, flexible,                            transoral; diagnostic, including collection of                            specimen(s) by brushing or washing, when performed                            (separate procedure) Diagnosis Code(s):        --- Professional ---                           D62, Acute posthemorrhagic anemia                           K92.1, Melena (includes Hematochezia) CPT copyright 2022 American Medical Association. All rights reserved. The codes documented in this report are preliminary and upon coder review may  be revised to meet current compliance requirements. Sandor Flatter, MD 06/30/2024 6:17:31 PM Number of Addenda:  0

## 2024-06-30 NOTE — Anesthesia Procedure Notes (Signed)
 Central Venous Catheter Insertion Performed by: Erma Thom SAUNDERS, MD, anesthesiologist Start/End11/22/2025 6:45 AM, 06/30/2024 6:55 AM Patient location: OR. Preanesthetic checklist: patient identified, IV checked, site marked, risks and benefits discussed, surgical consent, monitors and equipment checked, pre-op evaluation, timeout performed and anesthesia consent Position: Trendelenburg Lidocaine  1% used for infiltration and patient sedated Hand hygiene performed  and maximum sterile barriers used  Catheter size: 8 Fr Total catheter length 16. Central line was placed.Double lumen Procedure performed using ultrasound to evaluate access site. Ultrasound Notes:relevant anatomy identified, ultrasound used to visualize needle entry, vessel patent under ultrasound and image(s) printed for medical record. Attempts: 1 Following insertion, dressing applied and line sutured. Post procedure assessment: blood return through all ports  Patient tolerated the procedure well with no immediate complications.

## 2024-06-30 NOTE — Progress Notes (Signed)
 [facility] ROUNDING NOTE   Subjective: Episode of hypotension with syncopal episode overnight with SBP in the 50s.  Treated with IV fluids and ordered for 1 unit RBCs.  Currently on levo.  No bleeding overnight, but repeat H/H 7.5/22 from 9.3/28.  Mentating well.   Objective: Vital signs in last 24 hours: Temp:  [98.2 F (36.8 C)-100.7 F (38.2 C)] 100.4 F (38 C) (11/22 0753) Pulse Rate:  [84-126] 107 (11/22 0915) Resp:  [9-27] 9 (11/22 0915) BP: (50-123)/(36-84) 86/67 (11/22 0915) SpO2:  [92 %-100 %] 95 % (11/22 0915) Weight:  [81.6 kg] 81.6 kg (11/21 1036) Last BM Date : 06/30/24 General: NAD Lungs:  CTA b/l, no w/r/r Heart:  RRR, no m/r/g Abdomen:  Soft, NT, ND, +BS Ext:  No c/c/e    Intake/Output from previous day: 11/21 0701 - 11/22 0700 In: 2123.3 [I.V.:915.6; Blood:590; IV Piggyback:617.7] Out: 500 [Urine:500] Intake/Output this shift: No intake/output data recorded.   Lab Results: Recent Labs    06/29/24 0631 06/29/24 1251 06/30/24 0148 06/30/24 0423  WBC 16.2* 17.5* 14.0*  --   HGB 9.2* 9.3* 7.5* 6.2*  PLT 109* 104* 100*  --   MCV 91.6 92.4 92.1  --    BMET Recent Labs    06/29/24 0405 06/29/24 1251 06/30/24 0148  NA 138 140 141  K 4.3 3.9 3.5  CL 111 115* 113*  CO2 16* 19* 22  GLUCOSE 264* 150* 122*  BUN 11 10 9   CREATININE 1.07 0.87 0.86  CALCIUM  7.4* 7.2* 7.4*   LFT Recent Labs    06/28/24 1320 06/29/24 0405  PROT 5.1* 4.6*  ALBUMIN  3.0* 2.5*  AST 16 18  ALT 15 14  ALKPHOS 64 45  BILITOT 0.3 0.7   PT/INR Recent Labs    06/29/24 0405  INR 1.2      Imaging/Other results: CT Angio Abd/Pel w/ and/or w/o Result Date: 06/29/2024 EXAM: CTA ABDOMEN AND PELVIS WITHOUT AND WITH CONTRAST 06/29/2024 12:52:21 AM TECHNIQUE: CTA images of the abdomen and pelvis without and with intravenous contrast. 75 mL of iohexol  (OMNIPAQUE ) 350 MG/ML injection was administered. Three-dimensional MIP/volume rendered formations were  performed. Automated exposure control, iterative reconstruction, and/or weight based adjustment of the mA/kV was utilized to reduce the radiation dose to as low as reasonably achievable. COMPARISON: 06/28/2024 CLINICAL HISTORY: Lower GI bleed FINDINGS: VASCULATURE: AORTA: No acute finding. No abdominal aortic aneurysm. No dissection. CELIAC TRUNK: No acute finding. No occlusion or significant stenosis. SUPERIOR MESENTERIC ARTERY: No acute finding. No occlusion or significant stenosis. RENAL ARTERIES: No acute finding. No occlusion or significant stenosis. ILIAC ARTERIES: No acute finding. No occlusion or significant stenosis. LIVER: The liver is unremarkable. GALLBLADDER AND BILE DUCTS: Gallbladder is unremarkable. No biliary ductal dilatation. SPLEEN: The spleen is unremarkable. PANCREAS: The pancreas is unremarkable. ADRENAL GLANDS: Bilateral adrenal glands demonstrate no acute abnormality. KIDNEYS, URETERS AND BLADDER: No stones in the kidneys or ureters. No hydronephrosis. No perinephric or periureteral stranding. Urinary bladder is unremarkable. GI AND BOWEL: Interval fluid distention of the stomach. Mild pancolonic diverticulosis without superimposed acute inflammatory change. No active gastrointestinal hemorrhage. The stomach, small bowel, and large bowel are otherwise unremarkable. Appendix absent. There is no bowel obstruction. No abnormal bowel wall thickening or distension. REPRODUCTIVE: Moderate prostatic hypertrophy. PERITONEUM AND RETRPERITONEUM: No ascites or free air. LUNG BASE: No acute abnormality. LYMPH NODES: No lymphadenopathy. BONES AND SOFT TISSUES: T10 superior endplate fracture again noted. No acute soft tissue abnormality. IMPRESSION: 1. No active gastrointestinal hemorrhage. 2.  Mild pancolonic diverticulosis without superimposed acute inflammatory change. 3. Interval fluid distention of the stomach, nonspecific. 4. Moderate prostatic hypertrophy. 5. Unchanged T10 superior endplate  fracture. Electronically signed by: Dorethia Molt MD 06/29/2024 01:26 AM EST RP Workstation: [id number]   CT Angio Abd/Pel W and/or Wo Contrast Result Date: 06/28/2024 CLINICAL DATA:  Possible lower GI bleed. Dark red blood in stool since this morning. Five episodes. Dizziness. EXAM: CTA ABDOMEN AND PELVIS WITHOUT AND WITH CONTRAST TECHNIQUE: Multidetector CT imaging of the abdomen and pelvis was performed using the standard protocol during bolus administration of intravenous contrast. Multiplanar reconstructed images and MIPs were obtained and reviewed to evaluate the vascular anatomy. RADIATION DOSE REDUCTION: This exam was performed according to the departmental dose-optimization program which includes automated exposure control, adjustment of the mA and/or kV according to patient size and/or use of iterative reconstruction technique. CONTRAST:  85mL OMNIPAQUE  IOHEXOL  350 MG/ML SOLN COMPARISON:  11/11/2021 FINDINGS: VASCULAR Aorta: Normal caliber aorta without aneurysm, dissection, vasculitis or significant stenosis. Celiac: Patent without evidence of aneurysm, dissection, vasculitis or significant stenosis. SMA: Patent without evidence of aneurysm, dissection, vasculitis or significant stenosis. Renals: Both renal arteries are patent without evidence of aneurysm, dissection, vasculitis, fibromuscular dysplasia or significant stenosis. IMA: Patent without evidence of aneurysm, dissection, vasculitis or significant stenosis. Inflow: Patent without evidence of aneurysm, dissection, vasculitis or significant stenosis. Proximal Outflow: Bilateral common femoral and visualized portions of the superficial and profunda femoral arteries are patent without evidence of aneurysm, dissection, vasculitis or significant stenosis. Veins: No obvious venous abnormality within the limitations of this arterial phase study. Review of the MIP images confirms the above findings. NON-VASCULAR Lower chest: Heart is normal size.   Visualized lung bases are clear. Hepatobiliary: Liver, gallbladder and biliary tree are normal. Pancreas: Normal. Spleen: Normal. Adrenals/Urinary Tract: Adrenal glands are normal. Kidneys are normal in size without hydronephrosis or nephrolithiasis. Kidneys are otherwise unchanged. Ureters and bladder are normal. Stomach/Bowel: Stomach and small bowel are normal. Appendix is normal. Minimal diverticulosis of the colon. Lymphatic: No adenopathy. Reproductive: Prostate is unremarkable. Other: No significant free peritoneal fluid or focal inflammatory change. Musculoskeletal: Minimal loss of mid vertebral body height of T10 with small associated Schmorl's node new since the prior exam. IMPRESSION: 1. No acute findings in the abdomen/pelvis. No evidence of active GI bleed. 2. Minimal colonic diverticulosis. Electronically Signed   By: Toribio Agreste M.D.   On: 06/28/2024 13:59      Assessment and Plan:  1) Hematochezia 2) Acute blood loss anemia 3) Symptomatic anemia 4) Hypotension 5) Diverticulosis Colonoscopy on 11/21 with diverticulosis in the sigmoid, descending, ascending colon, with old blood and clots throughout the colon, but no active bleeding noted after extensive lavage.  1 polyp removed and polypectomy site clipped closed.  No overt bleeding overnight, but H/H declined to 7.5/23 today and did have hypotension with syncope overnight.  Since he is not overtly bleeding, I do not think a repeat CTA will show any source.  Instead, plan for the following: - EGD today - If EGD negative, plan for endoscopic placement of video capsule for further small bowel interrogation along with repeat colonoscopy without prep - If the studies are unremarkable, curious about atypical source like a Meckel's bleed? - BP management per CCS - Transfuse 1 unit RBCs - Will need MAC assistance for sedation    Sandor LULLA Flatter, DO  06/30/2024, 9:48 AM [facility] Pager [phone]

## 2024-06-30 NOTE — Anesthesia Postprocedure Evaluation (Signed)
 Anesthesia Post Note  Patient: [name]  Procedure(s) Performed: EGD (ESOPHAGOGASTRODUODENOSCOPY) COLONOSCOPY CAPSULE ENDOSCOPY, USING SMARTPILL MOTILITY TESTING SYSTEM     Patient location during evaluation: SICU Anesthesia Type: MAC Level of consciousness: awake and alert Pain management: pain level controlled Vital Signs Assessment: post-procedure vital signs reviewed and stable Respiratory status: spontaneous breathing, nonlabored ventilation, respiratory function stable and patient connected to nasal cannula oxygen Cardiovascular status: stable and blood pressure returned to baseline Postop Assessment: no apparent nausea or vomiting Anesthetic complications: no   No notable events documented.  Last Vitals:  Vitals:   06/30/24 1612 06/30/24 1910  BP:    Pulse: 100 91  Resp: 17 16  Temp: 37.1 C   SpO2: 100% 94%    Last Pain:  Vitals:   06/30/24 1612  TempSrc: Temporal  PainSc: 0-No pain                 Thom JONELLE Peoples

## 2024-06-30 NOTE — Evaluation (Signed)
 Physical Therapy Evaluation Patient Details Name: Christian Vance MRN: [id number] DOB: 1961-03-05 Today's Date: 06/30/2024  History of Present Illness  63 y.o. male experienced multiple bloody stools and felt dizzy and lightheaded. Wife brought him to [facility] ED 11/20. Experienced syncopal event and fell off BSC Found to be hypotensive in with continued bloody stool. S/p 11/21 colonoscopy, Diagnosed with hematochezia with acute blood loss anemia and diverticulosis. Fell off BSC after bloody bowel movement and BP 65/42 pt transferred to ICU 11/22 planned EDG. S/p EFY:ipjazuzd mellitus type 2, previous history of GI bleed in 2021 status post colonoscopy in 2021 which revealed diverticulosis, AVM and polyps  Clinical Impression  PTA pt living with wife in second story apartment with 15 steps to enter. Pt completely independent and working prior to hospitalization. Pt is currently limited in safe mobility by abdominal pain and decreased BP. Evaluation limited by fact pt BP was 70/33 in supine. Pt exhibits full ROM in LE and 4/5 strength. Able to roll with assist of bed rail to use bed pan. PT will follow back for further evaluation, but currently recommending HHPT   PT will continue to follow acutely.       If plan is discharge home, recommend the following: A little help with walking and/or transfers;A little help with bathing/dressing/bathroom;Assistance with cooking/housework;Assist for transportation;Help with stairs or ramp for entrance   Can travel by private vehicle    yes    Equipment Recommendations None recommended by PT     Functional Status Assessment Patient has had a recent decline in their functional status and demonstrates the ability to make significant improvements in function in a reasonable and predictable amount of time.     Precautions / Restrictions Precautions Precautions: Fall Precaution/Restrictions Comments: watch BP Restrictions Weight Bearing Restrictions Per Provider  Order: No      Mobility  Bed Mobility Overal bed mobility: Modified Independent             General bed mobility comments: use of bed rails to roll on and off bed pan    Transfers                   General transfer comment: deferred due to low BP           Pertinent Vitals/Pain Pain Assessment Pain Assessment: Faces Faces Pain Scale: Hurts little more Pain Location: lower abdomen Pain Descriptors / Indicators: Discomfort, Pressure, Tightness Pain Intervention(s): Limited activity within patient's tolerance, Monitored during session    Home Living Family/patient expects to be discharged to:: Private residence Living Arrangements: Spouse/significant other Available Help at Discharge: Family;Available 24 hours/day Type of Home: Apartment Home Access: Stairs to enter Entrance Stairs-Rails: Right Entrance Stairs-Number of Steps: 15   Home Layout: One level Home Equipment: Agricultural [employer] (2 wheels);BSC/3in1;Wheelchair - manual      Prior Function Prior Level of Function : Independent/Modified Independent                     Extremity/Trunk Assessment   Upper Extremity Assessment Upper Extremity Assessment: Defer to OT evaluation    Lower Extremity Assessment Lower Extremity Assessment: Overall WFL for tasks assessed    Cervical / Trunk Assessment Cervical / Trunk Assessment: Other exceptions (abdomen distended)  Communication   Communication Communication: No apparent difficulties    Cognition Arousal: Alert Behavior During Therapy: WFL for tasks assessed/performed, Flat affect   PT - Cognitive impairments: No apparent impairments  Following commands: Intact       Cueing Cueing Techniques: Verbal cues, Tactile cues, Visual cues     General Comments General comments (skin integrity, edema, etc.): BP in supine 70/33 MAP 46 HR 106 SpO2 in 90s on RA     PT Assessment Patient needs continued PT  services  PT Problem List Decreased activity tolerance;Decreased mobility;Pain       PT Treatment Interventions DME instruction;Gait training;Stair training;Functional mobility training;Therapeutic activities;Therapeutic exercise;Balance training;Cognitive remediation;Patient/family education    PT Goals (Current goals can be found in the Care Plan section)  Acute Rehab PT Goals PT Goal Formulation: With patient/family Time For Goal Achievement: 07/14/24 Potential to Achieve Goals: Fair    Frequency Min 2X/week        AM-PAC PT 6 Clicks Mobility  Outcome Measure Help needed turning from your back to your side while in a flat bed without using bedrails?: None Help needed moving from lying on your back to sitting on the side of a flat bed without using bedrails?: A Lot Help needed moving to and from a bed to a chair (including a wheelchair)?: Total Help needed standing up from a chair using your arms (e.g., wheelchair or bedside chair)?: Total Help needed to walk in hospital room?: Total Help needed climbing 3-5 steps with a railing? : Total 6 Click Score: 10    End of Session   Activity Tolerance: Patient limited by pain Patient left: in bed;with call bell/phone within reach;with family/visitor present Nurse Communication: Mobility status PT Visit Diagnosis: Muscle weakness (generalized) (M62.81);Difficulty in walking, not elsewhere classified (R26.2);Pain Pain - part of body:  (abdomen)    Time: 8884-8866 PT Time Calculation (min) (ACUTE ONLY): 18 min   Charges:   PT Evaluation $PT Eval Low Complexity: 1 Low   PT General Charges $$ ACUTE PT VISIT: 1 Visit         Christian Vance PT, DPT Acute Rehabilitation Services Please use secure chat or  Call Office [phone]   Christian Vance 06/30/2024, 4:55 PM

## 2024-06-30 NOTE — Anesthesia Preprocedure Evaluation (Signed)
 Anesthesia Evaluation  Patient identified by MRN, date of birth, ID band Patient awake    Reviewed: Allergy & Precautions, NPO status , Patient's Chart, lab work & pertinent test results  History of Anesthesia Complications Negative for: history of anesthetic complications  Airway Mallampati: II  TM Distance: >3 FB Neck ROM: Full    Dental  (+) Dental Advisory Given, Chipped   Pulmonary neg pulmonary ROS   Pulmonary exam normal        Cardiovascular Normal cardiovascular exam     Neuro/Psych neg Seizures negative neurological ROS  negative psych ROS   GI/Hepatic negative GI ROS,,,(+) Hepatitis -, BAcute GI bleed. Hg 5.1, s/p 3pRBC    Endo/Other  diabetes, Type 2, Oral Hypoglycemic Agents    Renal/GU negative Renal ROS     Musculoskeletal negative musculoskeletal ROS (+)    Abdominal   Peds  Hematology  (+) Blood dyscrasia, anemia   Anesthesia Other Findings   Reproductive/Obstetrics                              Anesthesia Physical Anesthesia Plan  ASA: 3  Anesthesia Plan: MAC   Post-op Pain Management: Minimal or no pain anticipated   Induction:   PONV Risk Score and Plan: 1 and Propofol  infusion and Treatment may vary due to age or medical condition  Airway Management Planned: Nasal Cannula and Natural Airway  Additional Equipment: None  Intra-op Plan:   Post-operative Plan:   Informed Consent: I have reviewed the patients History and Physical, chart, labs and discussed the procedure including the risks, benefits and alternatives for the proposed anesthesia with the patient or authorized representative who has indicated his/her understanding and acceptance.       Plan Discussed with: CRNA and Anesthesiologist  Anesthesia Plan Comments:          Anesthesia Quick Evaluation

## 2024-06-30 NOTE — Interval H&P Note (Signed)
 History and Physical Interval Note:  06/30/2024 4:18 PM  Christian Vance  has presented today for surgery, with the diagnosis of hematochezia, acute blood loss anemia.  The various methods of treatment have been discussed with the patient and family. After consideration of risks, benefits and other options for treatment, the patient has consented to  Procedure(s): EGD (ESOPHAGOGASTRODUODENOSCOPY) (N/A) COLONOSCOPY (N/A) as a surgical intervention.  The patient's history has been reviewed, patient examined, no change in status, stable for surgery.  I have reviewed the patient's chart and labs.  Questions were answered to the patient's satisfaction.     Christian Vance

## 2024-06-30 NOTE — Progress Notes (Signed)
 eLink Physician-Brief Progress Note Patient Name: Christian Vance DOB: 1960/09/17 MRN: [id number]   Date of Service  06/30/2024  HPI/Events of Note  Ne at 12, cvc in place  eICU Interventions  Change norepinephrine  to central line dosing     Intervention Category Intermediate Interventions: Hypotension - evaluation and management  [name] 06/30/2024, 9:44 PM

## 2024-06-30 NOTE — Progress Notes (Signed)
 Patient assisted by RN & NT with a stand & pivot transfer to bedside commode. Patient sat on bedside commode and became minimally responsive, unable to speak or track RN voice. BP cycled with Systolics in the 50's. RN called for help & Elink button pushed. Two RN's transferred patient back to bed. Levophed  gtt increased to 10mcg. Elink MD camered into room. Patient has small bright red bloody BM. Patients BP returned to base line, and patient now A&O x4. See MD's new orders.

## 2024-07-01 ENCOUNTER — Encounter (HOSPITAL_COMMUNITY): Payer: Self-pay | Admitting: Gastroenterology

## 2024-07-01 DIAGNOSIS — D62 Acute posthemorrhagic anemia: Secondary | ICD-10-CM | POA: Diagnosis not present

## 2024-07-01 DIAGNOSIS — K921 Melena: Secondary | ICD-10-CM | POA: Diagnosis not present

## 2024-07-01 LAB — BASIC METABOLIC PANEL WITH GFR
Anion gap: 9 (ref 5–15)
BUN: 12 mg/dL (ref 8–23)
CO2: 20 mmol/L — ABNORMAL LOW (ref 22–32)
Calcium: 7 mg/dL — ABNORMAL LOW (ref 8.9–10.3)
Chloride: 107 mmol/L (ref 98–111)
Creatinine, Ser: 1.07 mg/dL (ref 0.61–1.24)
GFR, Estimated: >60 mL/min (ref >60)
Glucose, Bld: 183 mg/dL — ABNORMAL HIGH (ref 70–99)
Potassium: 3.9 mmol/L (ref 3.5–5.1)
Sodium: 136 mmol/L (ref 135–145)

## 2024-07-01 LAB — CBC WITH DIFFERENTIAL/PLATELET
Abs Immature Granulocytes: 0.12 K/uL — ABNORMAL HIGH (ref 0.00–0.07)
Basophils Absolute: 0 K/uL (ref 0.0–0.1)
Basophils Relative: 0 %
Eosinophils Absolute: 0.2 K/uL (ref 0.0–0.5)
Eosinophils Relative: 1 %
HCT: 22 % — ABNORMAL LOW (ref 39.0–52.0)
Hemoglobin: 8 g/dL — ABNORMAL LOW (ref 13.0–17.0)
Immature Granulocytes: 1 %
Lymphocytes Relative: 21 %
Lymphs Abs: 3.8 K/uL (ref 0.7–4.0)
MCH: 30.3 pg (ref 26.0–34.0)
MCHC: 36.4 g/dL — ABNORMAL HIGH (ref 30.0–36.0)
MCV: 83.3 fL (ref 80.0–100.0)
Monocytes Absolute: 1.6 K/uL — ABNORMAL HIGH (ref 0.1–1.0)
Monocytes Relative: 9 %
Neutro Abs: 12.3 K/uL — ABNORMAL HIGH (ref 1.7–7.7)
Neutrophils Relative %: 68 %
Platelets: 71 K/uL — ABNORMAL LOW (ref 150–400)
RBC: 2.64 MIL/uL — ABNORMAL LOW (ref 4.22–5.81)
RDW: 15.9 % — ABNORMAL HIGH (ref 11.5–15.5)
Smear Review: NORMAL
WBC: 17.9 K/uL — ABNORMAL HIGH (ref 4.0–10.5)
nRBC: 0.7 % — ABNORMAL HIGH (ref 0.0–0.2)

## 2024-07-01 LAB — MAGNESIUM: Magnesium: 1.8 mg/dL (ref 1.7–2.4)

## 2024-07-01 LAB — GLUCOSE, CAPILLARY
Glucose-Capillary: 233 mg/dL — ABNORMAL HIGH (ref 70–99)
Glucose-Capillary: 189 mg/dL — ABNORMAL HIGH (ref 70–99)
Glucose-Capillary: 151 mg/dL — ABNORMAL HIGH (ref 70–99)
Glucose-Capillary: 147 mg/dL — ABNORMAL HIGH (ref 70–99)
Glucose-Capillary: 228 mg/dL — ABNORMAL HIGH (ref 70–99)
Glucose-Capillary: 189 mg/dL — ABNORMAL HIGH (ref 70–99)

## 2024-07-01 LAB — HEMOGLOBIN AND HEMATOCRIT, BLOOD
HCT: 20.3 % — ABNORMAL LOW (ref 39.0–52.0)
HCT: 24.6 % — ABNORMAL LOW (ref 39.0–52.0)
Hemoglobin: 7 g/dL — ABNORMAL LOW (ref 13.0–17.0)
Hemoglobin: 8.7 g/dL — ABNORMAL LOW (ref 13.0–17.0)

## 2024-07-01 LAB — LACTIC ACID, PLASMA: Lactic Acid, Venous: 1.1 mmol/L (ref 0.5–1.9)

## 2024-07-01 MED ORDER — LACTATED RINGERS IV BOLUS
1000.0000 mL | Freq: Once | INTRAVENOUS | Status: AC
  Administered 2024-07-01: 1000 mL via INTRAVENOUS

## 2024-07-01 MED ORDER — ENSURE PLUS HIGH PROTEIN PO LIQD
237.0000 mL | Freq: Two times a day (BID) | ORAL | Status: DC
  Administered 2024-07-01 – 2024-07-05 (×5): 237 mL via ORAL

## 2024-07-01 MED ORDER — PANTOPRAZOLE SODIUM 40 MG IV SOLR
40.0000 mg | INTRAVENOUS | Status: DC
  Administered 2024-07-02 – 2024-07-05 (×4): 40 mg via INTRAVENOUS
  Filled 2024-07-01 (×4): qty 10

## 2024-07-01 NOTE — Progress Notes (Signed)
 Syncopal episode after LARGE bloody maroon incontinent BM. Pt with deviated gaze right and down and was unresponsive for approx 90seconds and gradually became more alert.  Vitals unchanged with SPO2 in 98% range and BP consistent with previous with SBPs 100-110s. Neuro exam without deficit after regaining coherence. Follows commands but is mildly lethargic.  30 min prior to syncopal episode had large BM in bedpan with estimated volume of also bloody maroon with clots.   MD made aware and orders received for transfusion of PRBCs

## 2024-07-01 NOTE — Procedures (Signed)
 Video Capsule Endoscopy Report  Indication: Hematochezia, acute blood loss anemia. EGD unremarkable.  Colonoscopy x 2 with blood throughout the colon and diverticulosis, but clear source of bleeding not identified.  Findings: 1) Successful delivery of video capsule endoscopically 2) First duodenal image at 3 hours 42 minutes 3) Video capsule still in the small bowel on final image at 3 hours 31 minutes.  Incomplete study. 4) A few scattered areas of red spots noted throughout the small bowel.  These could represent tiny, nonbleeding AVMs, but more likely flecks of old blood.  There was otherwise no active bleeding throughout the small bowel.  There was darker material towards the end of the study.  Unclear if this represents source of small bowel bleed, but seems more likely that this was blood that has refluxed up from the colon.  Summary and Recommendations: Video capsule study is incomplete as the capsule was still in the small bowel at the conclusion of the study. There were a few red spots scattered in the small bowel that I suspect are flecks of old blood. There was darker material in the distal small bowel that could represent bleeding, but appearance seems more likely to be blood that has refluxed from the colon. Otherwise, no active bleeding or clear small bowel source of bleed on this study, albeit an incomplete exam with suboptimal views in the distal small bowel.   No bleeding overnight and Hgb has remained stable.  - Will advance to full liquids - Continue close monitoring of CBCs with additional blood products as needed - May need Lasix  given all the recent blood products, but only as BP allows - If acute rebleed, plan for stat CTA or tagged RBC scan (if available) - BP management per ICU service - Inpatient GI service will continue to follow  Sandor Flatter, DO, [facility]

## 2024-07-01 NOTE — Progress Notes (Signed)
 NAME:  Christian Vance, MRN:  [id number], DOB:  [birth year], LOS: 3 ADMISSION DATE:  06/28/2024, CONSULTATION DATE:  06/29/24 REFERRING MD:  TRH, CHIEF COMPLAINT:  GI bleed   History of Present Illness:  Christian Vance is a 63 year old male with a past medical history significant for previous GI bleed in 2021 felt secondary to diverticulosis, AVM and polyps, type 2 diabetes, and hepatitis B who presented to the ED at [facility] 11/20 for complaints of rectal bleeding.  Given concerns for active lower GI bleed patient was admitted per hospitalist.   A.m. 11/21 patient was seen hypotensive with several episodes of dark red bowel movements with clots indicating ongoing lower GI bleed.  Patient received 2 units PRBC.  Critical care and GI consulted.  Pertinent  Medical History  Previous GI bleed secondary to diverticulosis Diabetes Hepatitis B  Significant Hospital Events: Including procedures, antibiotic start and stop dates in addition to other pertinent events   11/20 presented with signs of lower GI bleed but remained hemodynamically stable 11/21 became hemodynamically unstable with active lower GI bleed this a.m. received units PRBC transferred to ICU for monitoring Underwent endoscopy which revealed no signs of active bleeding but did reveal large amount of old blood and clots throughout the colon, diverticulosis and required 1 polyp removal.  Interim History / Subjective:  Status post colonoscopy, EGD 11/22-no focus of bleeding noted Capsule endoscopy  Overnight, did not have any bloody bowel motions Blood pressure still soft  Objective    Blood pressure (!) 100/58, pulse (!) 106, temperature 98.3 F (36.8 C), temperature source Axillary, resp. rate 15, height 5' 10 (1.778 m), weight 81.6 kg, SpO2 94%.        Intake/Output Summary (Last 24 hours) at 07/01/2024 0736 Last data filed at 07/01/2024 0600 Gross per 24 hour  Intake 3345.03 ml  Output 1400 ml  Net 1945.03 ml   Filed  Weights   06/29/24 1036  Weight: 81.6 kg    Examination: General: Elderly, does not appear to be in distress HENT: Moist oral mucosa Lungs: Clear breath sounds bilaterally Cardiovascular: S1-S2 appreciated Abdomen: Soft, bowel sounds appreciated Extremities: No clubbing, no edema Neuro: Awake, alert and oriented x 3 GU: Nonfocal  I reviewed last 24 h vitals and pain scores, last 48 h intake and output, last 24 h labs and trends, and last 24 h imaging results. BUN of 12, creatinine of 1.07 H&H 8/22  4 L fluid positive  Has had 7 units of blood in total  Resolved problem list   Assessment and Plan   Lower GI bleed with negative colonoscopy for ongoing bleeding Negative EGD on 1122 -Capsule endoscopy -Had a polyp removed during for surgery -Stable overnight without bowel motions  Acute blood loss anemia Thrombocytopenia - Continue to trend -Has had 7 units of blood in total -H&H stable this morning  Hemorrhagic shock On Levophed , on midodrine  - Maintain MAP greater than 65 - Bolus 1 L of LR over 2 hours  Type 2 diabetes - Continue CBG checks CBG goal of 140-180  Chronic hepatitis B - Supportive  Will continue to monitor closely  Risk of harm still high Labs   CBC: Recent Labs  Lab 06/29/24 0405 06/29/24 0631 06/29/24 1251 06/30/24 0148 06/30/24 0423 06/30/24 0957 06/30/24 1228 06/30/24 1940 06/30/24 2355 07/01/24 0600  WBC 16.1* 16.2* 17.5* 14.0*  --   --   --   --   --  17.9*  NEUTROABS  --   --   --  9.6*  --   --   --   --   --  12.3*  HGB 10.3* 9.2* 9.3* 7.5*   < > 6.3* 5.1* 8.9* 8.7* 8.0*  HCT 31.1* 26.3* 27.9* 22.2*   < > 18.5* 15.0* 25.3* 24.6* 22.0*  MCV 95.7 91.6 92.4 92.1  --   --   --   --   --  83.3  PLT 113* 109* 104* 100*  --   --   --   --   --  71*   < > = values in this interval not displayed.    Basic Metabolic Panel: Recent Labs  Lab 06/28/24 1320 06/29/24 0405 06/29/24 1251 06/30/24 0148  NA 134* 138 140 141  K 4.2  4.3 3.9 3.5  CL 105 111 115* 113*  CO2 21* 16* 19* 22  GLUCOSE 227* 264* 150* 122*  BUN 14 11 10 9   CREATININE 0.68 1.07 0.87 0.86  CALCIUM  7.7* 7.4* 7.2* 7.4*  MG  --   --   --  1.6*   GFR: Estimated Creatinine Clearance: 88.4 mL/min (by C-G formula based on SCr of 0.86 mg/dL). Recent Labs  Lab 06/29/24 0631 06/29/24 1251 06/30/24 0148 07/01/24 0500 07/01/24 0600  WBC 16.2* 17.5* 14.0*  --  17.9*  LATICACIDVEN 3.7* 2.9*  --  1.1  --     Liver Function Tests: Recent Labs  Lab 06/28/24 1320 06/29/24 0405  AST 16 18  ALT 15 14  ALKPHOS 64 45  BILITOT 0.3 0.7  PROT 5.1* 4.6*  ALBUMIN  3.0* 2.5*   The patient is critically ill with multiple organ systems failure and requires high complexity decision making for assessment and support, frequent evaluation and titration of therapies, application of advanced monitoring technologies and extensive interpretation of multiple databases. Critical Care Time devoted to patient care services described in this note independent of APP/resident time (if applicable)  is 31 minutes.   Jennet Epley MD [facility] Personal pager: See Amion If unanswered, please page CCM On-call: #[phone]

## 2024-07-01 NOTE — Progress Notes (Addendum)
 Patient ID: Christian Vance, male   DOB: April 07, 1961, 63 y.o.   MRN: [id number]    Progress Note   Subjective   Day # 2 CC; acute presumed lower GI hemorrhage, major with hypotension  CTA this admission negative x 2  Currently still on low-dose Levophed  and midodrine   Significant recurrent GI bleeding yesterday, with hypotension requiring pressors and multiple units of packed RBCs (4-5 units yesterday, and 2 units on day of admission)   EGD yesterday normal-appearing esophagus stomach and duodenum, video capsule placed into the duodenum  Colonoscopy yesterday-with red and clotted blood through the entire colon lavaged noted multiple small mouth diverticula in the sigmoid descending and ascending colon no evidence of any active bleeding, few sessile polyps in the rectum and rectosigmoid 1 to 2 mm, not removed, unable to intubate the terminal ileum due to significant looping. 1 hemostatic clip found in the ascending colon from previous colonoscopy done on 06/29/2024  Hemoglobin 5.1 early yesterday, transfused 3 units of packed RBCs with hemoglobin up to an 8.7 last p.m.> hemoglobin 8 this a.m. BUN 12/creatinine 1.07  Capsule endoscopy interpreted this morning, no evidence of any bleeding in the stomach or small bowel.  Capsule did not reach the colon and there was some very dark material in the most distal portion of the small bowel visualized probably refluxed from the colon  Patient sitting up in bed, says he feels fine, wife at bedside He has been n.p.o. asking about having something to eat.  He denies any abdominal pain or discomfort, no nausea, has not had any bowel movement since procedures yesterday     Objective   Vital signs in last 24 hours: Temp:  [97.8 F (36.6 C)-101 F (38.3 C)] 98.6 F (37 C) (11/23 1115) Pulse Rate:  [91-123] 91 (11/23 1100) Resp:  [8-30] 16 (11/23 1100) BP: (80-138)/(48-86) 90/53 (11/23 1100) SpO2:  [90 %-100 %] 94 % (11/23 1100) Last BM Date :  06/30/24 General:   Well-developed older black male in NAD face and eyelids puffy Heart:  Regular rate and rhythm; no murmurs Lungs: Respirations even and unlabored, lungs CTA bilaterally Abdomen:  Soft, nontender and nondistended. Normal bowel sounds. Extremities: Trace edema Neurologic:  Alert and oriented,  grossly normal neurologically. Psych:  Cooperative. Normal mood and affect.  Intake/Output from previous day: 11/22 0701 - 11/23 0700 In: 3353.1 [I.V.:1268.4; Blood:1520.8; IV Piggyback:563.9] Out: 1400 [Urine:200; Stool:1100; Blood:100] Intake/Output this shift: No intake/output data recorded.  Lab Results: Recent Labs    06/29/24 1251 06/30/24 0148 06/30/24 0423 06/30/24 1940 06/30/24 2355 07/01/24 0600  WBC 17.5* 14.0*  --   --   --  17.9*  HGB 9.3* 7.5*   < > 8.9* 8.7* 8.0*  HCT 27.9* 22.2*   < > 25.3* 24.6* 22.0*  PLT 104* 100*  --   --   --  71*   < > = values in this interval not displayed.   BMET Recent Labs    06/29/24 1251 06/30/24 0148 07/01/24 0600  NA 140 141 136  K 3.9 3.5 3.9  CL 115* 113* 107  CO2 19* 22 20*  GLUCOSE 150* 122* 183*  BUN 10 9 12   CREATININE 0.87 0.86 1.07  CALCIUM  7.2* 7.4* 7.0*   LFT Recent Labs    06/29/24 0405  PROT 4.6*  ALBUMIN  2.5*  AST 18  ALT 14  ALKPHOS 45  BILITOT 0.7   PT/INR Recent Labs    06/29/24 0405  LABPROT 16.3*  INR  1.2       Assessment / Plan:    #64 63 year old male with previous history of diverticular hemorrhage 2021 who was transferred here from the [facility] on Thursday evening 06/28/2024 after he presented there with acute large-volume painless grossly bloody stools. Bleed was associated with hypotension, he was transfused and initial packed RBCs in the [facility] ER though hemoglobin 11.2 on arrival there.  CTA was done and negative for active bleeding Hemoglobin 9.2 on the evening of 06/28/2024. Decision has been made to proceed with bowel prep and then colonoscopy in a.m. on  06/29/2024. Patient did do the bowel prep but had a presyncopal episode at bedside with hypotension.  He was transfused 1 unit of packed RBCs and sent for repeat CTA which was again negative.  Improved with fluid bolus and blood.  Colonoscopy on 11/21-5 mm polyp with black speckled appearance found in the ascending colon sessile removed with snare and clipped few other tiny polyps in the rectum and sigmoid 1 to 2 mm not removed, small mucosal disruption in the sigmoid colon at 45 cm treated with 2 hemostatic clips was clotted blood in the entire colon but no evidence of active bleeding, and multiple small mouth diverticuli in the sigmoid, descending and ascending colon.  Unfortunately he bled again on the evening of 1121, again hypotensive.  Started on pressors and transfused a total of 4 units yesterday and underwent EGD which was negative for any evidence of active bleeding or source of bleeding, capsule was placed into the duodenum And repeat colonoscopy was done which again showed blood throughout the colon which was carefully lavaged, no evidence of active bleeding in the colon, there was significant looping and inability to get into the terminal ileum.  Capsule endoscopy incomplete, did not reach the colon, there was dark melenic material in the distal small bowel likely refluxed from the colon  He has been stable overnight no active bleeding hemoglobin 8 today  Source of bleeding likely major diverticular hemorrhage, cannot be entirely certain at this time that the bleeding was not from the terminal ileum or from Meckel's and will rule out  Plan; full liquid diet Wean off pressors as able Transfuse him to keep his hemoglobin 8 Meckel scan has been ordered, not done on the weekend so hopefully can be done tomorrow a.m.  Continue serial hemoglobins every 6 hours If he actively rebleeds would repeat CTA versus stat nuc med bleeding scan (as long as that can happen stat) Patient's wife says  that he had been taking a 325 mg aspirin  daily for general purposes Advised that he completely discontinue aspirin , and use only Tylenol   GI will continue to follow closely with you     Principal Problem:   GI bleed Active Problems:   DM (diabetes mellitus), type 2 (HCC)   Hepatitis B, chronic (HCC)   ABLA (acute blood loss anemia)   Symptomatic anemia   Adenomatous polyp of ascending colon   Diverticulosis of colon with hemorrhage     LOS: 3 days   Sherryn Pollino PA-C  07/01/2024, 11:30 AM

## 2024-07-02 ENCOUNTER — Inpatient Hospital Stay (HOSPITAL_COMMUNITY)

## 2024-07-02 ENCOUNTER — Encounter (HOSPITAL_COMMUNITY): Payer: Self-pay | Admitting: Emergency Medicine

## 2024-07-02 DIAGNOSIS — K5731 Diverticulosis of large intestine without perforation or abscess with bleeding: Secondary | ICD-10-CM

## 2024-07-02 DIAGNOSIS — K921 Melena: Secondary | ICD-10-CM

## 2024-07-02 DIAGNOSIS — R571 Hypovolemic shock: Secondary | ICD-10-CM

## 2024-07-02 DIAGNOSIS — D62 Acute posthemorrhagic anemia: Secondary | ICD-10-CM | POA: Diagnosis not present

## 2024-07-02 LAB — CBC WITH DIFFERENTIAL/PLATELET
Abs Immature Granulocytes: 0.09 K/uL — ABNORMAL HIGH (ref 0.00–0.07)
Basophils Absolute: 0 K/uL (ref 0.0–0.1)
Basophils Relative: 0 %
Eosinophils Absolute: 0.3 K/uL (ref 0.0–0.5)
Eosinophils Relative: 3 %
HCT: 19.3 % — ABNORMAL LOW (ref 39.0–52.0)
Hemoglobin: 6.8 g/dL — CL (ref 13.0–17.0)
Immature Granulocytes: 1 %
Lymphocytes Relative: 28 %
Lymphs Abs: 2.6 K/uL (ref 0.7–4.0)
MCH: 30.8 pg (ref 26.0–34.0)
MCHC: 35.2 g/dL (ref 30.0–36.0)
MCV: 87.3 fL (ref 80.0–100.0)
Monocytes Absolute: 0.9 K/uL (ref 0.1–1.0)
Monocytes Relative: 10 %
Neutro Abs: 5.5 K/uL (ref 1.7–7.7)
Neutrophils Relative %: 58 %
Platelets: 87 K/uL — ABNORMAL LOW (ref 150–400)
RBC: 2.21 MIL/uL — ABNORMAL LOW (ref 4.22–5.81)
RDW: 16 % — ABNORMAL HIGH (ref 11.5–15.5)
WBC: 9.4 K/uL (ref 4.0–10.5)
nRBC: 1.2 % — ABNORMAL HIGH (ref 0.0–0.2)

## 2024-07-02 LAB — TYPE AND SCREEN
ABO/RH(D): O POS
Antibody Screen: NEGATIVE
Unit division: 0
Unit division: 0
Unit division: 0
Unit division: 0
Unit division: 0
Unit division: 0
Unit division: 0
Unit division: 0
Unit division: 0
Unit division: 0
Unit division: 0
Unit division: 0

## 2024-07-02 LAB — POCT I-STAT, CHEM 8
BUN: 11 mg/dL (ref 8–23)
Calcium, Ion: 1.15 mmol/L (ref 1.15–1.40)
Chloride: 104 mmol/L (ref 98–111)
Creatinine, Ser: 0.9 mg/dL (ref 0.61–1.24)
Glucose, Bld: 281 mg/dL — ABNORMAL HIGH (ref 70–99)
HCT: 23 % — ABNORMAL LOW (ref 39.0–52.0)
Hemoglobin: 7.8 g/dL — ABNORMAL LOW (ref 13.0–17.0)
Potassium: 4.3 mmol/L (ref 3.5–5.1)
Sodium: 139 mmol/L (ref 135–145)
TCO2: 19 mmol/L — ABNORMAL LOW (ref 22–32)

## 2024-07-02 LAB — BPAM RBC
Blood Product Expiration Date: 202511272359
Blood Product Expiration Date: 202512142359
Blood Product Expiration Date: 202512152359
Blood Product Expiration Date: 202512192359
Blood Product Expiration Date: 202512192359
Blood Product Expiration Date: 202512192359
Blood Product Expiration Date: 202512192359
Blood Product Expiration Date: 202512192359
Blood Product Expiration Date: 202512192359
Blood Product Expiration Date: 202512192359
Blood Product Unit Number: 202512152359
Blood Product Unit Number: 202512152359
ISSUE DATE / TIME: 202511210730
ISSUE DATE / TIME: 202511220019
ISSUE DATE / TIME: 202511220019
ISSUE DATE / TIME: 202511220441
ISSUE DATE / TIME: 202511221221
ISSUE DATE / TIME: 202511221221
ISSUE DATE / TIME: 202511221627
PRODUCT CODE: 202511202337
PRODUCT CODE: 202511221221
PRODUCT CODE: 202512142359
PRODUCT CODE: 202512152359
Unit Type and Rh: 202511202337
Unit Type and Rh: 202512152359
Unit Type and Rh: 5100
Unit Type and Rh: 5100
Unit Type and Rh: 5100
Unit Type and Rh: 5100
Unit Type and Rh: 5100
Unit Type and Rh: 5100
Unit Type and Rh: 5100
Unit Type and Rh: 5100
Unit Type and Rh: 5100
Unit Type and Rh: 5100
Unit Type and Rh: 5100
Unit Type and Rh: 5100
Unit Type and Rh: 5100

## 2024-07-02 LAB — DIC (DISSEMINATED INTRAVASCULAR COAGULATION)PANEL
D-Dimer, Quant: 0.37 ug{FEU}/mL (ref 0.00–0.50)
Fibrinogen: 231 mg/dL (ref 210–475)
INR: 1.1 (ref 0.8–1.2)
Platelets: 90 K/uL — ABNORMAL LOW (ref 150–400)
Prothrombin Time: 15 s (ref 11.4–15.2)
Smear Review: NONE SEEN
aPTT: 28 s (ref 24–36)

## 2024-07-02 LAB — BASIC METABOLIC PANEL WITH GFR
Anion gap: 4 — ABNORMAL LOW (ref 5–15)
BUN: 6 mg/dL — ABNORMAL LOW (ref 8–23)
CO2: 21 mmol/L — ABNORMAL LOW (ref 22–32)
Calcium: 6.4 mg/dL — CL (ref 8.9–10.3)
Chloride: 114 mmol/L — ABNORMAL HIGH (ref 98–111)
Creatinine, Ser: 0.66 mg/dL (ref 0.61–1.24)
GFR, Estimated: >60 mL/min (ref >60)
Glucose, Bld: 98 mg/dL (ref 70–99)
Potassium: 3.1 mmol/L — ABNORMAL LOW (ref 3.5–5.1)
Sodium: 139 mmol/L (ref 135–145)

## 2024-07-02 LAB — PREPARE RBC (CROSSMATCH)

## 2024-07-02 LAB — GLUCOSE, CAPILLARY
Glucose-Capillary: 148 mg/dL — ABNORMAL HIGH (ref 70–99)
Glucose-Capillary: 133 mg/dL — ABNORMAL HIGH (ref 70–99)
Glucose-Capillary: 136 mg/dL — ABNORMAL HIGH (ref 70–99)
Glucose-Capillary: 102 mg/dL — ABNORMAL HIGH (ref 70–99)
Glucose-Capillary: 167 mg/dL — ABNORMAL HIGH (ref 70–99)

## 2024-07-02 LAB — HEMOGLOBIN AND HEMATOCRIT, BLOOD
HCT: 17.2 % — ABNORMAL LOW (ref 39.0–52.0)
HCT: 24.3 % — ABNORMAL LOW (ref 39.0–52.0)
Hemoglobin: 6 g/dL — CL (ref 13.0–17.0)
Hemoglobin: 8.7 g/dL — ABNORMAL LOW (ref 13.0–17.0)

## 2024-07-02 LAB — POTASSIUM: Potassium: 3.9 mmol/L (ref 3.5–5.1)

## 2024-07-02 LAB — MAGNESIUM: Magnesium: 1.6 mg/dL — ABNORMAL LOW (ref 1.7–2.4)

## 2024-07-02 MED ORDER — SODIUM CHLORIDE 0.9% IV SOLUTION: Freq: Once | INTRAVENOUS | Status: AC

## 2024-07-02 MED ORDER — CALCIUM GLUCONATE-NACL 2-0.675 GM/100ML-% IV SOLN
2.0000 g | Freq: Once | INTRAVENOUS | Status: AC
  Administered 2024-07-02: 2000 mg via INTRAVENOUS
  Filled 2024-07-02: qty 100

## 2024-07-02 MED ORDER — MAGNESIUM SULFATE 4 GM/100ML IV SOLN
4.0000 g | Freq: Once | INTRAVENOUS | Status: AC
  Administered 2024-07-02: 4 g via INTRAVENOUS
  Filled 2024-07-02: qty 100

## 2024-07-02 MED ORDER — SODIUM PERTECHNETATE TC 99M INJECTION: 10.0000 | Freq: Once | INTRAVENOUS | Status: DC

## 2024-07-02 MED ORDER — POTASSIUM CHLORIDE 10 MEQ/50ML IV SOLN
10.0000 meq | INTRAVENOUS | Status: AC
  Administered 2024-07-02 (×5): 10 meq via INTRAVENOUS
  Filled 2024-07-02 (×6): qty 50

## 2024-07-02 MED ORDER — POTASSIUM CHLORIDE 20 MEQ PO PACK
60.0000 meq | PACK | Freq: Once | ORAL | Status: DC
  Filled 2024-07-02: qty 3

## 2024-07-02 NOTE — Progress Notes (Signed)
 Per NP Warren DEL Dagenhart, pt is okay to resume full liquid diet.   Sonny DASEN RN

## 2024-07-02 NOTE — TOC CM/SW Note (Signed)
 Transition of Care [facility]) - Inpatient Brief Assessment   Patient Details  Name: Christian Vance MRN: [id number] Date of Birth: 1961/03/16  Transition of Care [facility]) CM/SW Contact:    Tom-Johnson, Harvest Muskrat, RN Phone Number: 07/02/2024, 12:48 PM   Clinical Narrative:  Patient presented to the ED with Dizziness, generalized Weakness and Dark Bloody Stools. Admitted with GI Bleed. Patient had an unresponsive episode overnight of 06/28/24 after having a bowel movement in the ED with blood and clots received 2U PRBC. Patient underwent Underwent Upper Endoscopy and Colonoscopy on 06/29/24. Patient's Hgb this am was 6.0, received 2U PRBC. GI following.   Plan for Meckels scan today to eval GI bleed.   From home with wife, [name]. Has two supportive adopted children. Self employed, independent with care and drive self prior to admission. Does not have DME's at home.  PCP is Theophilus Andrews, Tully GRADE, MD and uses [facility] on [address]   Home health recommended, patient states to hold off on referral for now until he is medically stable.  Patient not Medically ready for discharge.  CM will continue to follow as patient progresses with care towards discharge.          Transition of Care Asessment: Insurance and Status: Insurance coverage has been reviewed Patient has primary care physician: Yes Home environment has been reviewed: Yes Prior level of function:: Independent Prior/Current Home Services: No current home services Social Drivers of Health Review: SDOH reviewed no interventions necessary Readmission risk has been reviewed: Yes Transition of care needs: transition of care needs identified, TOC will continue to follow

## 2024-07-02 NOTE — Progress Notes (Signed)
 NAME:  Christian Vance, MRN:  [id number], DOB:  January 14, 1961, LOS: 4 ADMISSION DATE:  06/28/2024, CONSULTATION DATE:  06/29/24 REFERRING MD:  TRH, CHIEF COMPLAINT:  GI bleed   History of Present Illness:  Christian Vance is a 63 year old male with a past medical history significant for previous GI bleed in 2021 felt secondary to diverticulosis, AVM and polyps, type 2 diabetes, and hepatitis B who presented to the ED at [facility] 11/20 for complaints of rectal bleeding.  Given concerns for active lower GI bleed patient was admitted per hospitalist.   A.m. 11/21 patient was seen hypotensive with several episodes of dark red bowel movements with clots indicating ongoing lower GI bleed.  Patient received 2 units PRBC.  Critical care and GI consulted.  Pertinent  Medical History  Previous GI bleed secondary to diverticulosis Diabetes Hepatitis B  Significant Hospital Events: Including procedures, antibiotic start and stop dates in addition to other pertinent events   11/20 presented with signs of lower GI bleed but remained hemodynamically stable 11/21 became hemodynamically unstable with active lower GI bleed this a.m. received units PRBC transferred to ICU for monitoring Underwent endoscopy which revealed no signs of active bleeding but did reveal large amount of old blood and clots throughout the colon, diverticulosis and required 1 polyp removal. 11/24 Hgb 6>2 units pRBC transfused overnight; Meckels scan today  Interim History / Subjective:  Hgb drift overnight to 6. 2 unit pRBC transfused. Electrolytes replaced. Meckels scan today to eval GI bleed.  Objective    Blood pressure 128/72, pulse 96, temperature 100.1 F (37.8 C), temperature source Oral, resp. rate 20, height 5' 10 (1.778 m), weight 81.6 kg, SpO2 98%.        Intake/Output Summary (Last 24 hours) at 07/02/2024 9078 Last data filed at 07/02/2024 0800 Gross per 24 hour  Intake 700.69 ml  Output 1750 ml  Net -1049.31 ml    Filed Weights   06/29/24 1036  Weight: 81.6 kg   Examination: General: acutely-ill elderly male, in NAD HEENT: AT/[place], PERRL,  Pulm: normal inspiratory and expiratory effort on RA, CTAB CV: RRR, no m/g/r GI: soft, non distended, non tender to palpation Integumentary: warm, dry, no edema Neuro: A&O x3, no focal deficits   Labs/imaging reviewed Wife updated at bedside  Resolved problem list   Assessment and Plan   Lower GI bleed with negative colonoscopy for ongoing bleeding Diverticulosis -GI following -CTA x2 negative -Negative EGD on 11/22 -Capsule endoscopy, 1 polyp removed -Meckels scan today; c/f diverticular hemorrhage vs Meckels -Consider tagged RBC if continuing to bleed  Acute blood loss anemia Thrombocytopenia -Continue to trend; transfuse for Hgb <8 -Has had 9 units of blood in total -Prior ASA 325mg  daily use for general aches/pain, advised to stop  Hemorrhagic shock - Continue Levophed , on midodrine  - Wean pressors to maintain MAP >65 - Volume replacement w/ pRBC transfusion as indicated  Type 2 diabetes - Continue CBG checks - CBG goal of 140-180  Chronic hepatitis B - Supportive  Labs   CBC: Recent Labs  Lab 06/29/24 0631 06/29/24 1251 06/30/24 0148 06/30/24 0423 06/30/24 2355 07/01/24 0600 07/01/24 1743 07/01/24 2342 07/02/24 0440 07/02/24 0634  WBC 16.2* 17.5* 14.0*  --   --  17.9*  --   --  9.4  --   NEUTROABS  --   --  9.6*  --   --  12.3*  --   --  5.5  --   HGB 9.2* 9.3* 7.5*   < >  8.7* 8.0* 7.0* 6.0* 6.8*  --   HCT 26.3* 27.9* 22.2*   < > 24.6* 22.0* 20.3* 17.2* 19.3*  --   MCV 91.6 92.4 92.1  --   --  83.3  --   --  87.3  --   PLT 109* 104* 100*  --   --  71*  --   --  87* 90*   < > = values in this interval not displayed.    Basic Metabolic Panel: Recent Labs  Lab 06/29/24 0405 06/29/24 1251 06/30/24 0148 06/30/24 1635 07/01/24 0600 07/02/24 0440  NA 138 140 141 139 136 139  K 4.3 3.9 3.5 4.3 3.9 3.1*  CL 111  115* 113* 104 107 114*  CO2 16* 19* 22  --  20* 21*  GLUCOSE 264* 150* 122* 281* 183* 98  BUN 11 10 9 11 12  6*  CREATININE 1.07 0.87 0.86 0.90 1.07 0.66  CALCIUM  7.4* 7.2* 7.4*  --  7.0* 6.4*  MG  --   --  1.6*  --  1.8 1.6*   GFR: Estimated Creatinine Clearance: 95.1 mL/min (by C-G formula based on SCr of 0.66 mg/dL). Recent Labs  Lab 06/29/24 0631 06/29/24 1251 06/30/24 0148 07/01/24 0500 07/01/24 0600 07/02/24 0440  WBC 16.2* 17.5* 14.0*  --  17.9* 9.4  LATICACIDVEN 3.7* 2.9*  --  1.1  --   --     Liver Function Tests: Recent Labs  Lab 06/28/24 1320 06/29/24 0405  AST 16 18  ALT 15 14  ALKPHOS 64 45  BILITOT 0.3 0.7  PROT 5.1* 4.6*  ALBUMIN  3.0* 2.5*   CC time: 35 minutes  Warren Shade, DNP, AGACNP-BC [facility]  Please see [email or web address] for pager details.  From 7A-7P if no response, please call [phone]. After hours, please call ELink [phone].

## 2024-07-02 NOTE — Progress Notes (Addendum)
 eLink Physician-Brief Progress Note Patient Name: Christian Vance DOB: 1960-08-29 MRN: [id number]   Date of Service  07/02/2024  HPI/Events of Note  Hg 6.0, no active bleeding  eICU Interventions  Transfuse 1u pRBC   0548 - incomplete response, hg 6.8, transfuse additional unit. Coag studies  0623 - potassium, mag, and calcium   Intervention Category Intermediate Interventions: Bleeding - evaluation and treatment with blood products  Jorita Bohanon 07/02/2024, 12:18 AM

## 2024-07-02 NOTE — Progress Notes (Signed)
 HISTORY OF PRESENT ILLNESS:  Christian Vance is a 63 y.o. male presents with significant GI bleeding.  Case reviewed with Dr. San.  He is felt to have a diverticular bleed.  Has undergone colonoscopy x 2, upper endoscopy, and capsule endoscopy.  Currently nuclear medicine for Meckel scan.  Hemoglobin 6.8 (6.0)  REVIEW OF SYSTEMS:  All non-GI ROS negative. 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  followed by [facility] ID service.      Past Surgical History:  Procedure Laterality Date   BIOPSY  01/19/2020   Procedure: BIOPSY;  Surgeon: Christian Gwendlyn DASEN, MD;  Location: [facility] ENDOSCOPY;  Service: Endoscopy;;   COLONOSCOPY N/A 06/29/2024   Procedure: COLONOSCOPY;  Surgeon: Christian Sandor GAILS, DO;  Location: [facility] ENDOSCOPY;  Service: Gastroenterology;  Laterality: N/A;   COLONOSCOPY N/A 06/30/2024   Procedure: COLONOSCOPY;  Surgeon: Christian Sandor GAILS, DO;  Location: [facility] ENDOSCOPY;  Service: Gastroenterology;  Laterality: N/A;   COLONOSCOPY WITH PROPOFOL  N/A 01/19/2020   Procedure: COLONOSCOPY WITH PROPOFOL ;  Surgeon: Christian Gwendlyn DASEN, MD;  Location: [facility] ENDOSCOPY;  Service: Endoscopy;  Laterality: N/A;   ESOPHAGOGASTRODUODENOSCOPY N/A 06/30/2024   Procedure: EGD (ESOPHAGOGASTRODUODENOSCOPY);  Surgeon: Christian Sandor GAILS, DO;  Location: [facility] ENDOSCOPY;  Service: Gastroenterology;  Laterality: N/A;   ESOPHAGOGASTRODUODENOSCOPY (EGD) WITH PROPOFOL  N/A 01/19/2020   Procedure: ESOPHAGOGASTRODUODENOSCOPY (EGD) WITH PROPOFOL ;  Surgeon: Christian Gwendlyn DASEN, MD;  Location: [facility] ENDOSCOPY;  Service: Endoscopy;  Laterality: N/A;   HEMOSTASIS CLIP PLACEMENT  06/29/2024   Procedure: CONTROL OF HEMORRHAGE, GI TRACT, ENDOSCOPIC, BY CLIPPING OR OVERSEWING;  Surgeon: Christian Sandor GAILS, DO;  Location: [facility] ENDOSCOPY;  Service: Gastroenterology;;   HOT HEMOSTASIS N/A 01/19/2020   Procedure: HOT HEMOSTASIS (ARGON  PLASMA COAGULATION/BICAP);  Surgeon: Christian Gwendlyn DASEN, MD;  Location: [facility] ENDOSCOPY;  Service: Endoscopy;  Laterality: N/A;   ORIF ANKLE FRACTURE Right 11/13/2021   Procedure: OPEN REDUCTION INTERNAL FIXATION (ORIF) ANKLE FRACTURE;  Surgeon: Christian Franky SQUIBB, MD;  Location: [facility] OR;  Service: Orthopedics;  Laterality: Right;   ORIF CLAVICULAR FRACTURE Left 11/13/2021   Procedure: OPEN REDUCTION INTERNAL FIXATION (ORIF) CLAVICULAR FRACTURE;  Surgeon: Christian Franky SQUIBB, MD;  Location: [facility] OR;  Service: Orthopedics;  Laterality: Left;   POLYPECTOMY  01/19/2020   Procedure: POLYPECTOMY;  Surgeon: Christian Gwendlyn DASEN, MD;  Location: [facility] ENDOSCOPY;  Service: Endoscopy;;   SMART PILL PROCEDURE  06/30/2024   Procedure: CAPSULE ENDOSCOPY, USING SMARTPILL MOTILITY TESTING SYSTEM;  Surgeon: Christian Sandor GAILS, DO;  Location: [facility] ENDOSCOPY;  Service: Gastroenterology;;    Social History Christian Vance  reports that he has never smoked. He has never used smokeless tobacco. He reports that he does not drink alcohol and does not use drugs.  family history is not on file.  No Known Allergies     PHYSICAL EXAMINATION: Vital signs: BP 112/67   Pulse 93   Temp (!) 100.4 F (38 C) (Oral)   Resp (!) 21   Ht 5' 10 (1.778 m)   Wt 81.6 kg   SpO2 96%   BMI 25.83 kg/m   In radiology  ASSESSMENT:  1.  Acute GI bleeding felt to be diverticular in nature.  Extensive GI workup as outlined.  Currently stable   PLAN:  1.  Follow-up Meckel scan 2.  Transfuse as needed 3.  For acute bleeding, repeat CTA.  If positive, refer to  IR for embolization therapy Will follow  Christian Vance., M.D. [facility] Division of Gastroenterology

## 2024-07-02 NOTE — Progress Notes (Signed)
 eLink Physician-Brief Progress Note Patient Name: Christian Vance DOB: 10/30/1960 MRN: [id number]   Date of Service  07/02/2024  HPI/Events of Note  I updated family on the results of the nuclear medicine scan.  eICU Interventions          [name] Dub 07/02/2024, 8:51 PM

## 2024-07-02 NOTE — Plan of Care (Signed)

## 2024-07-03 ENCOUNTER — Inpatient Hospital Stay (HOSPITAL_COMMUNITY)

## 2024-07-03 DIAGNOSIS — D62 Acute posthemorrhagic anemia: Secondary | ICD-10-CM | POA: Diagnosis not present

## 2024-07-03 DIAGNOSIS — K5731 Diverticulosis of large intestine without perforation or abscess with bleeding: Secondary | ICD-10-CM | POA: Diagnosis not present

## 2024-07-03 LAB — GLUCOSE, CAPILLARY
Glucose-Capillary: 111 mg/dL — ABNORMAL HIGH (ref 70–99)
Glucose-Capillary: 127 mg/dL — ABNORMAL HIGH (ref 70–99)
Glucose-Capillary: 304 mg/dL — ABNORMAL HIGH (ref 70–99)
Glucose-Capillary: 130 mg/dL — ABNORMAL HIGH (ref 70–99)
Glucose-Capillary: 190 mg/dL — ABNORMAL HIGH (ref 70–99)

## 2024-07-03 LAB — BPAM RBC
Blood Product Expiration Date: 202511272359
Blood Product Expiration Date: 202512192359
ISSUE DATE / TIME: 202511240133
ISSUE DATE / TIME: 202511240949
Unit Type and Rh: 5100
Unit Type and Rh: 5100

## 2024-07-03 LAB — CBC WITH DIFFERENTIAL/PLATELET
Abs Immature Granulocytes: 0.09 K/uL — ABNORMAL HIGH (ref 0.00–0.07)
Basophils Absolute: 0 K/uL (ref 0.0–0.1)
Basophils Relative: 0 %
Eosinophils Absolute: 0.2 K/uL (ref 0.0–0.5)
Eosinophils Relative: 3 %
HCT: 25 % — ABNORMAL LOW (ref 39.0–52.0)
Hemoglobin: 8.7 g/dL — ABNORMAL LOW (ref 13.0–17.0)
Immature Granulocytes: 1 %
Lymphocytes Relative: 26 %
Lymphs Abs: 1.9 K/uL (ref 0.7–4.0)
MCH: 30.1 pg (ref 26.0–34.0)
MCHC: 34.8 g/dL (ref 30.0–36.0)
MCV: 86.5 fL (ref 80.0–100.0)
Monocytes Absolute: 0.7 K/uL (ref 0.1–1.0)
Monocytes Relative: 10 %
Neutro Abs: 4.4 K/uL (ref 1.7–7.7)
Neutrophils Relative %: 60 %
Platelets: 88 K/uL — ABNORMAL LOW (ref 150–400)
RBC: 2.89 MIL/uL — ABNORMAL LOW (ref 4.22–5.81)
RDW: 17.7 % — ABNORMAL HIGH (ref 11.5–15.5)
Smear Review: NORMAL
WBC: 7.2 K/uL (ref 4.0–10.5)
nRBC: 2.8 % — ABNORMAL HIGH (ref 0.0–0.2)

## 2024-07-03 LAB — TYPE AND SCREEN
ABO/RH(D): O POS
Antibody Screen: NEGATIVE
Unit division: 0
Unit division: 0

## 2024-07-03 LAB — BASIC METABOLIC PANEL WITH GFR
Anion gap: 6 (ref 5–15)
BUN: <5 mg/dL — ABNORMAL LOW (ref 8–23)
CO2: 24 mmol/L (ref 22–32)
Calcium: 7.9 mg/dL — ABNORMAL LOW (ref 8.9–10.3)
Chloride: 110 mmol/L (ref 98–111)
Creatinine, Ser: 0.89 mg/dL (ref 0.61–1.24)
GFR, Estimated: >60 mL/min (ref >60)
Glucose, Bld: 109 mg/dL — ABNORMAL HIGH (ref 70–99)
Potassium: 3.6 mmol/L (ref 3.5–5.1)
Sodium: 140 mmol/L (ref 135–145)

## 2024-07-03 LAB — PHOSPHORUS: Phosphorus: 2.5 mg/dL (ref 2.5–4.6)

## 2024-07-03 LAB — TROPONIN I (HIGH SENSITIVITY): Troponin I (High Sensitivity): 16 ng/L (ref <18)

## 2024-07-03 LAB — SURGICAL PATHOLOGY

## 2024-07-03 LAB — MAGNESIUM: Magnesium: 1.9 mg/dL (ref 1.7–2.4)

## 2024-07-03 MED ORDER — MAGNESIUM SULFATE 2 GM/50ML IV SOLN
2.0000 g | Freq: Once | INTRAVENOUS | Status: AC
  Administered 2024-07-03: 2 g via INTRAVENOUS
  Filled 2024-07-03: qty 50

## 2024-07-03 MED ORDER — POTASSIUM CHLORIDE CRYS ER 20 MEQ PO TBCR
40.0000 meq | EXTENDED_RELEASE_TABLET | Freq: Once | ORAL | Status: AC
  Administered 2024-07-03: 40 meq via ORAL
  Filled 2024-07-03: qty 2

## 2024-07-03 MED ORDER — K PHOS MONO-SOD PHOS DI & MONO 155-852-130 MG PO TABS
500.0000 mg | ORAL_TABLET | Freq: Once | ORAL | Status: AC
  Administered 2024-07-03: 500 mg via ORAL
  Filled 2024-07-03: qty 2

## 2024-07-03 MED ORDER — MIDODRINE HCL 5 MG PO TABS
10.0000 mg | ORAL_TABLET | Freq: Three times a day (TID) | ORAL | Status: DC
  Administered 2024-07-03 – 2024-07-04 (×3): 10 mg via ORAL
  Filled 2024-07-03 (×4): qty 2

## 2024-07-03 NOTE — Progress Notes (Signed)
 [facility] ADULT ICU REPLACEMENT PROTOCOL   The patient does apply for the [facility] Adult ICU Electrolyte Replacment Protocol based on the criteria listed below:   1.Exclusion criteria: TCTS, ECMO, Dialysis, and Myasthenia Gravis patients 2. Is GFR >/= 30 ml/min? Yes.    Patient's GFR today is >60 3. Is SCr </= 2? Yes.   Patient's SCr is 0.89 mg/dL 4. Did SCr increase >/= 0.5 in 24 hours? No. 5.Pt's weight >40kg  Yes.   6. Abnormal electrolyte(s): potassium 3.6, mag 1.9  7. Electrolytes replaced per protocol 8.  Call MD STAT for K+ </= 2.5, Phos </= 1, or Mag </= 1 Physician:  protocol  Claretta JINNY Sharps 07/03/2024 6:01 AM

## 2024-07-03 NOTE — Progress Notes (Signed)
 MEssage from bedside RN if patient should be in med surg or tele because he got transferred to med surg without teele. SABRA Per bedise RN - central tele is questioning med surg  Per ICU RN and my exam earlier he has been in NSR Currently in floor, patient has been stable  Plan  - get EKG  - get trop - if both normal, continue med surg without tele  Bedside RN to Call results to pager [phone]   SIGNATURE    Dr. Dorethia Cave, M.D., F.C.C.P,  Pulmonary and Critical Care Medicine Staff Physician, [facility] Director - Interstitial Lung Disease  Program  Pulmonary Fibrosis [facility] at [facility] [place], [place], [address]   Pager: [phone], If no answer  -> Check AMION or Try [phone] Telephone (clinical office): [phone] Telephone (research): [phone]  6:42 PM 07/03/2024

## 2024-07-03 NOTE — Progress Notes (Signed)
 Physical Therapy Treatment Patient Details Name: Christian Vance MRN: [id number] DOB: 01/12/1961 Today's Date: 07/03/2024   History of Present Illness 63 y.o. male experienced multiple bloody stools and felt dizzy and lightheaded. Wife brought him to [facility] ED 11/20. Experienced syncopal event and fell off BSC Found to be hypotensive in with continued bloody stool. S/p 11/21 colonoscopy, Diagnosed with hematochezia with acute blood loss anemia and diverticulosis. Fell off BSC after bloody bowel movement and BP 65/42 pt transferred to ICU 11/22 planned EDG. S/p EFY:ipjazuzd mellitus type 2, previous history of GI bleed in 2021 status post colonoscopy in 2021 which revealed diverticulosis, AVM and polyps    PT Comments  Pt supine in bed eager to get up as he is feeling better. Pt BP is stable throughout session.  Pt limited in safe mobility by minor balance deficits in walking likely due to not being up for a few days. Pt is mod I for bed mobility, supervision for transfers and contact guard for ambulation around ICU pushing IV pole. Pt reports there is still a camera in his stomach and is hopeful to have BM soon to get it out. Agreeable to sitting up in recliner to aid in motility. D/c plans remain appropriate at this time but pt likely will progress to not needing post acute PT/ PT will continue to follow acutely.   Orthostatic BPs  Supine 130/70  Sitting 122/80  Standing  127/78  After ambulation  128/82      If plan is discharge home, recommend the following: A little help with walking and/or transfers;A little help with bathing/dressing/bathroom;Assistance with cooking/housework;Assist for transportation;Help with stairs or ramp for entrance   Can travel by private vehicle      Yes  Equipment Recommendations  None recommended by PT       Precautions / Restrictions Precautions Precautions: Fall Precaution/Restrictions Comments: watch BP Restrictions Weight Bearing Restrictions Per Provider  Order: No     Mobility  Bed Mobility Overal bed mobility: Modified Independent             General bed mobility comments: used bed rails to come to EoB    Transfers Overall transfer level: Needs assistance Equipment used: Rolling walker (2 wheels) Transfers: Sit to/from Stand Sit to Stand: Supervision           General transfer comment: vc for hand placement on bed to power up, supervision for safety    Ambulation/Gait Ambulation/Gait assistance: Contact guard assist Gait Distance (Feet): 140 Feet Assistive device: IV Pole, Rolling walker (2 wheels) Gait Pattern/deviations: Step-through pattern, Decreased step length - right, Decreased step length - left, Shuffle Gait velocity: slowed Gait velocity interpretation: <1.8 ft/sec, indicate of risk for recurrent falls   General Gait Details: RW for in room ambulation, tried without AD, but preferred to push IV pole for stability, slowed gait, overall steady,         Balance Overall balance assessment: Mild deficits observed, not formally tested                                          Communication Communication Communication: No apparent difficulties  Cognition Arousal: Alert Behavior During Therapy: WFL for tasks assessed/performed, Flat affect   PT - Cognitive impairments: No apparent impairments  Following commands: Intact      Cueing Cueing Techniques: Verbal cues, Tactile cues, Visual cues     General Comments General comments (skin integrity, edema, etc.): BP stable with positional change and walking see vitals, on RA      Pertinent Vitals/Pain Pain Assessment Pain Assessment: No/denies pain     PT Goals (current goals can now be found in the care plan section) Acute Rehab PT Goals PT Goal Formulation: With patient/family Time For Goal Achievement: 07/14/24 Potential to Achieve Goals: Fair Progress towards PT goals: Progressing toward  goals    Frequency    Min 2X/week       AM-PAC PT 6 Clicks Mobility   Outcome Measure  Help needed turning from your back to your side while in a flat bed without using bedrails?: None Help needed moving from lying on your back to sitting on the side of a flat bed without using bedrails?: A Lot Help needed moving to and from a bed to a chair (including a wheelchair)?: A Little Help needed standing up from a chair using your arms (e.g., wheelchair or bedside chair)?: A Little Help needed to walk in hospital room?: A Little Help needed climbing 3-5 steps with a railing? : Total 6 Click Score: 16    End of Session   Activity Tolerance: Patient tolerated treatment well Patient left: with call bell/phone within reach;in chair;with chair alarm set Nurse Communication: Mobility status PT Visit Diagnosis: Muscle weakness (generalized) (M62.81);Unsteadiness on feet (R26.81)     Time: 9063-8987 PT Time Calculation (min) (ACUTE ONLY): 36 min  Charges:    $Gait Training: 8-22 mins $Therapeutic Activity: 8-22 mins PT General Charges $$ ACUTE PT VISIT: 1 Visit                     Christian Vance PT, DPT Acute Rehabilitation Services Please use secure chat or  Call Office [phone]    Christian Vance 07/03/2024, 10:31 AM

## 2024-07-03 NOTE — Plan of Care (Signed)
  Problem: Education: Goal: Knowledge of General Education information will improve Description: Including pain rating scale, medication(s)/side effects and non-pharmacologic comfort measures Outcome: Completed/Met   Problem: Education: Goal: Knowledge of General Education information will improve Description: Including pain rating scale, medication(s)/side effects and non-pharmacologic comfort measures Outcome: Completed/Met

## 2024-07-03 NOTE — Plan of Care (Signed)

## 2024-07-03 NOTE — Progress Notes (Signed)
 NAME:  NYSIR FERGUSSON, MRN:  [id number], DOB:  03-20-1961, LOS: 5 ADMISSION DATE:  06/28/2024, CONSULTATION DATE:  06/29/24 REFERRING MD:  TRH, CHIEF COMPLAINT:  GI bleed   History of Present Illness:  Christian Vance is a 63 year old male with a past medical history significant for previous GI bleed in 2021 felt secondary to diverticulosis, AVM and polyps, type 2 diabetes, and hepatitis B who presented to the ED at [facility] 11/20 for complaints of rectal bleeding.  Given concerns for active lower GI bleed patient was admitted per hospitalist.   A.m. 11/21 patient was seen hypotensive with several episodes of dark red bowel movements with clots indicating ongoing lower GI bleed.  Patient received 2 units PRBC.  Critical care and GI consulted.  Pertinent  Medical History  Previous GI bleed secondary to diverticulosis Diabetes Hepatitis B  Significant Hospital Events: Including procedures, antibiotic start and stop dates in addition to other pertinent events   11/20 presented with signs of lower GI bleed but remained hemodynamically stable 11/21 became hemodynamically unstable with active lower GI bleed this a.m. received units PRBC transferred to ICU for monitoring Underwent endoscopy which revealed no signs of active bleeding but did reveal large amount of old blood and clots throughout the colon, diverticulosis and required 1 polyp removal. CT ANGIOP abd:  IMPRESSION: 1. No active gastrointestinal hemorrhage. 2. Mild pancolonic diverticulosis without superimposed acute inflammatory change. 3. Interval fluid distention of the stomach, nonspecific. 4. Moderate prostatic hypertrophy. 5. Unchanged T10 superior endplate fracture. 11/24 Hgb 6>2 units pRBC transfused overnight; Meckels scan today - Hgb drift overnight to 6. 2 unit pRBC transfused. Electrolytes replaced. Meckels scan today to eval GI bleed.  Interim History / Subjective:    07/03/24: remains off pressors. On room air. MRSA PCR  neg/ Afebrile v low grade fever. WBC normal, Hgb 8.7 this AM.  Per GI yesterdaym .  He is felt to have a diverticular bleed.  Has undergone colonoscopy x 2, upper endoscopy, and capsule endoscopy.  Currently nuclear medicine for Meckel scan. - Meckel NEGATIVE    - No active bleeding  - walked around with PT - says he feels wel    Objective    Blood pressure 123/69, pulse (!) 104, temperature 99.4 F (37.4 C), temperature source Oral, resp. rate (!) 23, height 5' 10 (1.778 m), weight 81.6 kg, SpO2 100%.        Intake/Output Summary (Last 24 hours) at 07/03/2024 1006 Last data filed at 07/03/2024 0800 Gross per 24 hour  Intake 589.28 ml  Output 2545 ml  Net -1955.72 ml   Filed Weights   06/29/24 1036  Weight: 81.6 kg   Examination: General Appearance:  Looks well. Sitting in chair. On cell phone. Wife joined later Head:  Normocephalic, without obvious abnormality, atraumatic Eyes:  PERRL - yes, conjunctiva/corneas - muddu     Ears:  Normal external ear canals, both ears Nose:  G tube - no Throat:  ETT TUBE - no , OG tube - no Neck:  Supple,  No enlargement/tenderness/nodules Lungs: Clear to auscultation bilaterally, Heart:  S1 and S2 normal, no murmur, CVP - no.  Pressors - no Abdomen:  Soft, no masses, no organomegaly Genitalia / Rectal:  Not done Extremities:  Extremities- intact Skin:  ntact in exposed areas . Sacral area - nond Neurologic:  Sedation - +1 -> RASS - yes . Moves all 4s - neg. CAM-ICU - neg . Orientation - x3+     Assessment  and Plan   Lower GI bleed with negative colonoscopy for ongoing bleeding Diverticulosis -GI following -CTA x2 negative -Negative EGD on 11/22 -Capsule endoscopy, 1 polyp removed -Meckels scan 11/24 - negative  11/25 - he says he does not consume enough fiber in diet  PLAN - GI recommends repeat CTA if re-bleed - advised high fiber diet  Acute blood loss anemia this admit  ? Source.  Diverticulosis appears  mild s/p 9 +  U PRbc this admit  - 11/25 - hgb holding  Plan  - - PRBC for hgb </= 6.9gm%    - exceptions are   -  if ACS susepcted/confirmed then transfuse for hgb </= 8.0gm%,  or    -  active bleeding with hemodynamic instability, then transfuse regardless of hemoglobin value   At at all times try to transfuse 1 unit prbc as possible with exception of active hemorrhage  - GI recommends repeat CTA if re-bleed - no more home ASA 325  Thrombocytopenia - onset 06/28/24 (normal in Jan 2025)  - likely due to trasnfusion etc  Plan  - monitor and reassess    Hemorrhagic shock - resolved 07/03/24  Plan   = MAP goal > 65 - midodrine  taper 11/25 and then aim to dc 11/26- 11/27   - dc CVL   Type 2 diabetes - Continue CBG checks - CBG goal of 140-180  Chronic hepatitis B  PLAN - Supportive - Get RUQ US    Summary Check List Dr Geronimo  Diet: full liqiudi Pain/Anxiety/Delirium protocol (if indicated): x VAP protocol (if indicated): x DVT prophylaxis: CONTRINDICATED GI prophylaxis: ppi but GI to decide Glucose control: x Mobility: normal Code Status: full Family Communication: wife updated over phone 07/03/24 Disposition: Move to med surg. [facility] primary and ccm off 07/04/24       ATTESTATION & SIGNATURE    Dr. Dorethia Geronimo, M.D., [facility]C.P Pulmonary and Critical Care Medicine Staff Physician, [facility] Critical [facility] Pager: [phone], If no answer or between  15:00h - 7:00h: call [phone]  07/03/2024 10:06 AM    LABS    PULMONARY Recent Labs  Lab 06/30/24 1635  TCO2 19*    CBC Recent Labs  Lab 07/01/24 0600 07/01/24 1743 07/02/24 0440 07/02/24 0634 07/02/24 1837 07/03/24 0505  HGB 8.0*   < > 6.8*  --  8.7* 8.7*  HCT 22.0*   < > 19.3*  --  24.3* 25.0*  WBC 17.9*  --  9.4  --   --  7.2  PLT 71*  --  87* 90*  --  88*   < > = values in this interval not displayed.    COAGULATION Recent Labs  Lab  06/29/24 0405 07/02/24 0634  INR 1.2 1.1    CARDIAC  No results for input(s): TROPONINI in the last 168 hours. No results for input(s): PROBNP in the last 168 hours.   CHEMISTRY Recent Labs  Lab 06/29/24 1251 06/30/24 0148 06/30/24 1635 07/01/24 0600 07/02/24 0440 07/02/24 2105 07/03/24 0505  NA 140 141 139 136 139  --  140  K 3.9 3.5 4.3 3.9 3.1* 3.9 3.6  CL 115* 113* 104 107 114*  --  110  CO2 19* 22  --  20* 21*  --  24  GLUCOSE 150* 122* 281* 183* 98  --  109*  BUN 10 9 11 12  6*  --  <5*  CREATININE 0.87 0.86 0.90 1.07 0.66  --  0.89  CALCIUM  7.2* 7.4*  --  7.0* 6.4*  --  7.9*  MG  --  1.6*  --  1.8 1.6*  --  1.9  PHOS  --   --   --   --   --   --  2.5   Estimated Creatinine Clearance: 85.4 mL/min (by C-G formula based on SCr of 0.89 mg/dL).   LIVER Recent Labs  Lab 06/28/24 1320 06/29/24 0405 07/02/24 0634  AST 16 18  --   ALT 15 14  --   ALKPHOS 64 45  --   BILITOT 0.3 0.7  --   PROT 5.1* 4.6*  --   ALBUMIN  3.0* 2.5*  --   INR  --  1.2 1.1     INFECTIOUS Recent Labs  Lab 06/29/24 0631 06/29/24 1251 07/01/24 0500  LATICACIDVEN 3.7* 2.9* 1.1     ENDOCRINE CBG (last 3)  Recent Labs    07/02/24 2313 07/03/24 0317 07/03/24 0747  GLUCAP 167* 111* 127*         IMAGING x48h  - image(s) personally visualized  -   highlighted in bold NM Bowel Img Meckels Result Date: 07/02/2024 EXAM: MECKEL SCAN 07/02/2024 06:15:30 PM TECHNIQUE: RADIOPHARMACEUTICAL: 11.1 mCi Tc-31m pertechnetate. Dynamic planar imaging of the abdomen is obtained for 60 minutes following injection. COMPARISON: CT 06/29/2024. CLINICAL HISTORY: 846831 Acute GI bleeding 153168 Acute GI bleeding FINDINGS: There is physiologic activity in the stomach. Small amount of activity accumulates in the urinary bladder. No abnormal uptake to localize a Meckel's diverticulum containing gastric mucosa. IMPRESSION: 1. No evidence of Meckel's diverticulum containing gastric mucosa.  Electronically signed by: Franky Crease MD 07/02/2024 07:13 PM EST RP Workstation: [id number]

## 2024-07-03 NOTE — Progress Notes (Addendum)
 Daily Progress Note  DOA: 06/28/2024 Hospital Day: 6  Cc: GI bleed  ASSESSMENT    63 yo male admitted with significant GI bleeding with hypotension requiring pressors and several units of PRBCs. He has undergone extensive evaluation including colonoscopy x2, EGD and small bowel video capsule study ( incomplete study), Meckel scan (negative). Suspect this was a diverticular bleed TODAY: Last transfused 2 units PRBCs 11/24 at  4 am and noon.  Hgb stable overnight at 8.7. No BMs or bleeding in 2 days   Principal Problem:   GI bleed Active Problems:   DM (diabetes mellitus), type 2 (HCC)   Hepatitis B, chronic (HCC)   ABLA (acute blood loss anemia)   Symptomatic anemia   Adenomatous polyp of ascending colon   Diverticulosis of colon with hemorrhage    PLAN   Advance to soft diet GI will sign off Patient will watch for the passage of video capsule in stool.  He will contact our office if the capsule does not pass with bowel movement in the next week  Subjective   No BMs or bleeding in 2 days   Objective    Recent Labs    07/01/24 0600 07/01/24 1743 07/02/24 0440 07/02/24 0634 07/02/24 1837 07/03/24 0505  WBC 17.9*  --  9.4  --   --  7.2  HGB 8.0*   < > 6.8*  --  8.7* 8.7*  HCT 22.0*   < > 19.3*  --  24.3* 25.0*  MCV 83.3  --  87.3  --   --  86.5  PLT 71*  --  87* 90*  --  88*   < > = values in this interval not displayed.   No results for input(s): FOLATE, VITAMINB12, FERRITIN, TIBC, IRONPCTSAT in the last 72 hours. Recent Labs    07/01/24 0600 07/02/24 0440 07/02/24 2105 07/03/24 0505  NA 136 139  --  140  K 3.9 3.1* 3.9 3.6  CL 107 114*  --  110  CO2 20* 21*  --  24  GLUCOSE 183* 98  --  109*  BUN 12 6*  --  <5*  CREATININE 1.07 0.66  --  0.89  CALCIUM  7.0* 6.4*  --  7.9*   No results for input(s): PROT, ALBUMIN , AST, ALT, ALKPHOS, BILITOT, BILIDIR, IBILI in the last 72 hours.    Imaging:  NM Bowel Img  Meckels EXAM: MECKEL SCAN 07/02/2024 06:15:30 PM  TECHNIQUE: RADIOPHARMACEUTICAL: 11.1 mCi Tc-41m pertechnetate.  Dynamic planar imaging of the abdomen is obtained for 60 minutes following injection.  COMPARISON: CT 06/29/2024.  CLINICAL HISTORY: 846831 Acute GI bleeding 153168 Acute GI bleeding  FINDINGS:  There is physiologic activity in the stomach. Small amount of activity accumulates in the urinary bladder. No abnormal uptake to localize a Meckel's diverticulum containing gastric mucosa.  IMPRESSION: 1. No evidence of Meckel's diverticulum containing gastric mucosa.  Electronically signed by: Franky Crease MD 07/02/2024 07:13 PM EST RP Workstation: [id number]     Scheduled inpatient medications:   Chlorhexidine  Gluconate Cloth  6 each Topical Daily   feeding supplement  237 mL Oral BID BM   insulin  aspart  0-9 Units Subcutaneous Q4H   midodrine   10 mg Oral TID WC   pantoprazole  (PROTONIX ) IV  40 mg Intravenous Q24H   simethicone   240 mg Oral Once   sodium pertechnetate  10 millicurie Intravenous Once   Continuous inpatient infusions:   sodium chloride      PRN inpatient medications:  acetaminophen  **OR** acetaminophen , ondansetron  **OR** ondansetron  (ZOFRAN ) IV, mouth rinse, sodium chloride   Vital signs in last 24 hours: Temp:  [98.5 F (36.9 C)-100.1 F (37.8 C)] 98.5 F (36.9 C) (11/25 1123) Pulse Rate:  [79-109] 79 (11/25 1400) Resp:  [16-26] 16 (11/25 1400) BP: (90-137)/(48-86) 110/68 (11/25 1400) SpO2:  [97 %-100 %] 100 % (11/25 1400) Last BM Date : 06/30/24  Intake/Output Summary (Last 24 hours) at 07/03/2024 1526 Last data filed at 07/03/2024 1000 Gross per 24 hour  Intake 54.11 ml  Output 2445 ml  Net -2390.89 ml    Intake/Output from previous day: 11/24 0701 - 11/25 0700 In: 766.1 [I.V.:56.6; Blood:315; IV Piggyback:394.5] Out: 2270 [Urine:2270] Intake/Output this shift: Total I/O In: 50 [IV Piggyback:50] Out: 875  [Urine:875]   Physical Exam:  General: Alert male in NAD Heart:  Regular rate .  Pulmonary: Normal respiratory effort Abdomen: Soft, nondistended, nontender. Normal bowel sounds. Extremities: No lower extremity edema  Neurologic: Alert and oriented Psych: Pleasant. Cooperative     LOS: 5 days   Vina Dasen ,NP 07/03/2024, 3:26 PM  GI ATTENDING  Interval history data reviewed.  Patient seen and examined as outlined above.  Agree with interval progress note. Thankfully, patient has stopped bleeding.  Hemoglobin stable.  Exhaustive workup of the entire gut is only yielded severe colonic diverticular disease.  He is felt to have had a significant diverticular bleed.  Again, fortunately, bleeding has stopped.  No further recommendations from GI standpoint.  Diet as tolerated.  Timing for discharge per primary service.  No outpatient GI follow-up required.  We will sign off.  Available if needed.  Norleen SAILOR. Abran Raddle., M.D. [facility] Division of Gastroenterology

## 2024-07-03 NOTE — Plan of Care (Signed)
   Problem: Education: Goal: Knowledge of General Education information will improve Description: Including pain rating scale, medication(s)/side effects and non-pharmacologic comfort measures Outcome: Completed/Met

## 2024-07-04 DIAGNOSIS — K922 Gastrointestinal hemorrhage, unspecified: Secondary | ICD-10-CM | POA: Diagnosis not present

## 2024-07-04 LAB — CBC WITH DIFFERENTIAL/PLATELET
Abs Immature Granulocytes: 0.04 K/uL (ref 0.00–0.07)
Basophils Absolute: 0 K/uL (ref 0.0–0.1)
Basophils Relative: 0 %
Eosinophils Absolute: 0.2 K/uL (ref 0.0–0.5)
Eosinophils Relative: 3 %
HCT: 24.7 % — ABNORMAL LOW (ref 39.0–52.0)
Hemoglobin: 8.3 g/dL — ABNORMAL LOW (ref 13.0–17.0)
Immature Granulocytes: 1 %
Lymphocytes Relative: 26 %
Lymphs Abs: 1.6 K/uL (ref 0.7–4.0)
MCH: 29.7 pg (ref 26.0–34.0)
MCHC: 33.6 g/dL (ref 30.0–36.0)
MCV: 88.5 fL (ref 80.0–100.0)
Monocytes Absolute: 0.5 K/uL (ref 0.1–1.0)
Monocytes Relative: 8 %
Neutro Abs: 3.8 K/uL (ref 1.7–7.7)
Neutrophils Relative %: 62 %
Platelets: 143 K/uL — ABNORMAL LOW (ref 150–400)
RBC: 2.79 MIL/uL — ABNORMAL LOW (ref 4.22–5.81)
RDW: 19.1 % — ABNORMAL HIGH (ref 11.5–15.5)
WBC: 6.1 K/uL (ref 4.0–10.5)
nRBC: 1.2 % — ABNORMAL HIGH (ref 0.0–0.2)

## 2024-07-04 LAB — BASIC METABOLIC PANEL WITH GFR
Anion gap: 8 (ref 5–15)
BUN: <5 mg/dL — ABNORMAL LOW (ref 8–23)
CO2: 25 mmol/L (ref 22–32)
Calcium: 8.5 mg/dL — ABNORMAL LOW (ref 8.9–10.3)
Chloride: 110 mmol/L (ref 98–111)
Creatinine, Ser: 0.8 mg/dL (ref 0.61–1.24)
GFR, Estimated: >60 mL/min (ref >60)
Glucose, Bld: 110 mg/dL — ABNORMAL HIGH (ref 70–99)
Potassium: 3.7 mmol/L (ref 3.5–5.1)
Sodium: 143 mmol/L (ref 135–145)

## 2024-07-04 LAB — LACTIC ACID, PLASMA: Lactic Acid, Venous: 0.8 mmol/L (ref 0.5–1.9)

## 2024-07-04 LAB — GLUCOSE, CAPILLARY
Glucose-Capillary: 104 mg/dL — ABNORMAL HIGH (ref 70–99)
Glucose-Capillary: 158 mg/dL — ABNORMAL HIGH (ref 70–99)
Glucose-Capillary: 174 mg/dL — ABNORMAL HIGH (ref 70–99)
Glucose-Capillary: 203 mg/dL — ABNORMAL HIGH (ref 70–99)
Glucose-Capillary: 211 mg/dL — ABNORMAL HIGH (ref 70–99)
Glucose-Capillary: 240 mg/dL — ABNORMAL HIGH (ref 70–99)

## 2024-07-04 LAB — MAGNESIUM: Magnesium: 1.8 mg/dL (ref 1.7–2.4)

## 2024-07-04 LAB — PHOSPHORUS: Phosphorus: 4.3 mg/dL (ref 2.5–4.6)

## 2024-07-04 MED ORDER — INSULIN ASPART 100 UNIT/ML IJ SOLN: 3.0000 [IU] | Freq: Three times a day (TID) | INTRAMUSCULAR | Status: DC

## 2024-07-04 MED ORDER — INSULIN ASPART 100 UNIT/ML IJ SOLN
0.0000 [IU] | Freq: Every day | INTRAMUSCULAR | Status: DC
  Administered 2024-07-04: 2 [IU] via SUBCUTANEOUS
  Filled 2024-07-04: qty 2

## 2024-07-04 MED ORDER — FERROUS SULFATE 325 (65 FE) MG PO TABS
325.0000 mg | ORAL_TABLET | ORAL | Status: DC
  Administered 2024-07-04: 325 mg via ORAL
  Filled 2024-07-04: qty 1

## 2024-07-04 MED ORDER — INSULIN ASPART 100 UNIT/ML IJ SOLN
0.0000 [IU] | Freq: Three times a day (TID) | INTRAMUSCULAR | Status: DC
  Administered 2024-07-05: 3 [IU] via SUBCUTANEOUS
  Administered 2024-07-05: 5 [IU] via SUBCUTANEOUS
  Filled 2024-07-04: qty 5
  Filled 2024-07-04: qty 3

## 2024-07-04 MED ORDER — INSULIN ASPART 100 UNIT/ML IJ SOLN
3.0000 [IU] | Freq: Three times a day (TID) | INTRAMUSCULAR | Status: DC
  Administered 2024-07-05 (×2): 3 [IU] via SUBCUTANEOUS
  Filled 2024-07-04 (×2): qty 3

## 2024-07-04 MED ORDER — MIDODRINE HCL 5 MG PO TABS
5.0000 mg | ORAL_TABLET | Freq: Three times a day (TID) | ORAL | Status: DC
  Administered 2024-07-04 – 2024-07-05 (×3): 5 mg via ORAL
  Filled 2024-07-04 (×2): qty 1

## 2024-07-04 MED ORDER — MIDODRINE HCL 5 MG PO TABS: 5.0000 mg | ORAL_TABLET | Freq: Three times a day (TID) | ORAL | Status: DC

## 2024-07-04 NOTE — Progress Notes (Addendum)
   07/04/24 0902  Mobility  Activity Ambulated independently  Level of Assistance Standby assist, set-up cues, supervision of patient - no hands on  Assistive Device None  Distance Ambulated (ft) 500 ft  Activity Response Tolerated fair  Mobility Referral Yes  Mobility visit 1 Mobility  Mobility Specialist Start Time (ACUTE ONLY) 0902  Mobility Specialist Stop Time (ACUTE ONLY) 0909  Mobility Specialist Time Calculation (min) (ACUTE ONLY) 7 min   Mobility Specialist: Progress Note  Pt agreeable to mobility session - received in bed. C/o squeezing headache. Returned to standing in room with all needs met - call bell within reach.   Additional comments: Pt seen for additional visit. Results same as before, denies headache. States he needs some rest.   Virgle Boards, BS Mobility Specialist Please contact via SecureChat or  Rehab office at [phone].

## 2024-07-04 NOTE — Inpatient Diabetes Management (Addendum)
 Inpatient Diabetes Program Recommendations  AACE/ADA: New Consensus Statement on Inpatient Glycemic Control   Target Ranges:  Prepandial:   less than 140 mg/dL      Peak postprandial:   less than 180 mg/dL (1-2 hours)      Critically ill patients:  140 - 180 mg/dL   Lab Results  Component Value Date   GLUCAP 240 (H) 07/04/2024   HGBA1C 9.0 (A) 03/21/2024    Latest Reference Range & Units 07/03/24 11:24 07/03/24 16:47 07/03/24 21:10 07/04/24 00:03 07/04/24 04:23 07/04/24 08:37 07/04/24 11:00  Glucose-Capillary 70 - 99 mg/dL 695 (H) 869 (H) 809 (H) 158 (H) 104 (H) 174 (H) 240 (H)   Review of Glycemic Control  Diabetes history: DM2  Outpatient Diabetes medications:  Jardiance  10 mg daily Ozempic  0.25 mg Qweek   Current orders for Inpatient glycemic control:  Novolog  0-9 units Q4HRS   Inpatient Diabetes Program Recommendations:   Noted postprandial CBG elevated.   Please consider starting Novolog  3 units TID with meals.   Thanks,  Lavanda Search, RN, MSN, [facility]  Inpatient Diabetes Coordinator  Pager [phone] (8a-5p)

## 2024-07-04 NOTE — Progress Notes (Signed)
 Physical Therapy Discharge Patient Details Name: Christian Vance MRN: [id number] DOB: 1961-04-11 Today's Date: 07/04/2024 Time:  -     Patient discharged from PT services secondary to pt amb well with mobility team and feels he is back to baseline and does not need any further therapy. Pt did not feel he needed to practice stairs. Will sign off. Mobility team to continue to follow.  Progress and discharge plan discussed with patient and/or caregiver: Patient/Caregiver agrees with plan  GP     Rodgers ORN [facility] 07/04/2024, 4:51 PM  Rodgers Opal PT Acute Rehabilitation Services Office [phone]

## 2024-07-04 NOTE — Hospital Course (Addendum)
 63 y.o. M with hx GI bleed due to diverticulosis, AVMs and polyps, DM and hepatitis B who presented with LGIB.  Admitted to ICU on pressors.  Undewent colonoscopy that was unreamrakble.

## 2024-07-04 NOTE — Progress Notes (Signed)
  Progress Note   Patient: Christian Vance FMW:[id number] DOB: 1960/09/19 DOA: 06/28/2024     6 DOS: the patient was seen and examined on 07/04/2024 at 9:47      Brief hospital course: 63 y.o. M with hx GI bleed due to diverticulosis, AVMs and polyps, DM and hepatitis B who presented with LGIB.  Admitted to ICU on pressors.  Undewent colonoscopy that was unreamrakble.     Assessment and Plan: Lower GI bleed Acute blood loss anemia Required 9 units PRBCs this admission.  CTA x2 negative for bleed.  Weaned off pressors.  Negative GED, capsule study, Meckels scan and colonoscopy. - Repeat CTA if rebleed - Trend CBC - High fiber diet - Iron - Stop aspirin   Hemorrhagic shock - Wean midodrine   Diabetes with hyperglycemia - Continue Ss corrections  Chronic hepatitis B US  abdomen unremarkable        Subjective: no new complaints, no nursing concersn.  No BM overnight, no further bleeding.     Physical Exam: BP 118/75 (BP Location: Right Arm)   Pulse 90   Temp 98.2 F (36.8 C)   Resp 17   Ht 5' 10 (1.778 m)   Wt 81.6 kg   SpO2 100%   BMI 25.83 kg/m   General:  t is alert, awake, not in acute distress Cardiovascular: RRR, nl S1-S2, no murmurs appreciated.   No LE edema.   Respiratory: Normal respiratory rate and rhythm.  CTAB without rales or wheezes. Abdominal: Abdomen soft and non-tender.  No distension or HSM.   Neuro/Psych: Strength symmetric in upper and lower extremities.  Judgment and insight appear normal.   Data Reviewed: CBC shows stable anemia, slightly lower.  Thrombiocytopenia improved BMP normal    Family Communication:     Disposition: Status is: Inpatient         Author: Lonni SHAUNNA Dalton, MD 07/04/2024 4:42 PM  For on call review [email or web address].

## 2024-07-05 DIAGNOSIS — K922 Gastrointestinal hemorrhage, unspecified: Secondary | ICD-10-CM | POA: Diagnosis not present

## 2024-07-05 LAB — CBC
HCT: 26 % — ABNORMAL LOW (ref 39.0–52.0)
Hemoglobin: 8.7 g/dL — ABNORMAL LOW (ref 13.0–17.0)
MCH: 29.6 pg (ref 26.0–34.0)
MCHC: 33.5 g/dL (ref 30.0–36.0)
MCV: 88.4 fL (ref 80.0–100.0)
Platelets: 188 K/uL (ref 150–400)
RBC: 2.94 MIL/uL — ABNORMAL LOW (ref 4.22–5.81)
RDW: 18.9 % — ABNORMAL HIGH (ref 11.5–15.5)
WBC: 5.7 K/uL (ref 4.0–10.5)
nRBC: 0.5 % — ABNORMAL HIGH (ref 0.0–0.2)

## 2024-07-05 LAB — GLUCOSE, CAPILLARY
Glucose-Capillary: 189 mg/dL — ABNORMAL HIGH (ref 70–99)
Glucose-Capillary: 224 mg/dL — ABNORMAL HIGH (ref 70–99)

## 2024-07-05 MED ORDER — FERROUS SULFATE 325 (65 FE) MG PO TABS: 325.0000 mg | ORAL_TABLET | ORAL | Status: AC

## 2024-07-05 MED ORDER — SODIUM CHLORIDE (PF) 0.9 % IJ SOLN
INTRAMUSCULAR | Status: AC
  Administered 2024-07-05: 10 mL
  Filled 2024-07-05: qty 10

## 2024-07-05 NOTE — TOC Transition Note (Signed)
 Transition of Care [facility]) - Discharge Note   Patient Details  Name: Christian Vance MRN: [id number] Date of Birth: September 10, 1960  Transition of Care [facility]) CM/SW Contact:  Tom-Johnson, Harvest Muskrat, RN Phone Number: 07/05/2024, 10:51 AM   Clinical Narrative:     Patient is scheduled for discharge today.  Readmission Risk Assessment done. Outpatient f/u, hospital f/u and discharge instructions on AVS. No ICM needs or recommendations noted, PT updated to no f/u. Wife, [name] to transport at discharge.  No further ICM needs noted.       Final next level of care: Home/Self Care Barriers to Discharge: Barriers Resolved   Patient Goals and CMS Choice Patient states their goals for this hospitalization and ongoing recovery are:: To return home CMS Medicare.gov Compare Post Acute Care list provided to:: Patient Choice offered to / list presented to : Patient      Discharge Placement                Patient to be transferred to facility by: Wife Name of family member notified: [name]    Discharge Plan and Services Additional resources added to the After Visit Summary for                  DME Arranged: N/A DME Agency: NA       HH Arranged: NA HH Agency: NA        Social Drivers of Health (SDOH) Interventions SDOH Screenings   Food Insecurity: No Food Insecurity (07/03/2024)  Housing: Low Risk  (07/04/2024)  Transportation Needs: No Transportation Needs (07/03/2024)  Utilities: Not At Risk (07/03/2024)  Depression (PHQ2-9): Low Risk  (08/24/2023)  Social Connections: Socially Integrated (07/03/2024)  Tobacco Use: Low Risk  (07/02/2024)     Readmission Risk Interventions    07/02/2024   12:48 PM  Readmission Risk Prevention Plan  Post Dischage Appt Complete  Medication Screening Complete  Transportation Screening Complete

## 2024-07-05 NOTE — Plan of Care (Signed)
  Problem: Clinical Measurements: Goal: Ability to maintain clinical measurements within normal limits will improve Outcome: Adequate for Discharge   Problem: Activity: Goal: Risk for activity intolerance will decrease Outcome: Adequate for Discharge   Problem: Nutrition: Goal: Adequate nutrition will be maintained Outcome: Adequate for Discharge   Problem: Coping: Goal: Level of anxiety will decrease Outcome: Adequate for Discharge   Problem: Education: Goal: Ability to describe self-care measures that may prevent or decrease complications (Diabetes Survival Skills Education) will improve Outcome: Adequate for Discharge Goal: Individualized Educational Video(s) Outcome: Adequate for Discharge   Problem: Coping: Goal: Ability to adjust to condition or change in health will improve Outcome: Adequate for Discharge

## 2024-07-05 NOTE — Discharge Summary (Signed)
 Physician Discharge Summary   Patient: Christian Vance MRN: [id number] DOB: 05/02/1961  Admit date:     06/28/2024  Discharge date: 07/05/24  Discharge Physician: Lonni SHAUNNA Dalton   PCP: Theophilus Andrews, Tully GRADE, MD     Recommendations at discharge:  Follow up with PCP Dr. Theophilus in 1 week Dr. Theophilus: Please check CBC in 1 week Please check iron studies at appropriate interval and continue or d/c ferrous sulfate  as needed  Follow up with GI Dr. San as directed     Discharge Diagnoses: Principal Problem:   GI bleed Active Problems:   DM (diabetes mellitus), type 2 (HCC)   Hepatitis B, chronic (HCC)   Acute blood loss anemia   Symptomatic anemia   Adenomatous polyp of ascending colon   Diverticulosis of colon with hemorrhage of large intestine      Hospital Course: 63 y.o. M with hx GI bleed due to diverticulosis, AVMs and polyps, DM and hepatitis B who presented with LGIB.  Admitted to ICU on pressors.  Undewent colonoscopy that was unreamrakble.      Lower GI bleed Patient was admitted and GI were consulted.  He underwent colonoscopy on 11/21 by Dr. San that was negative.  CT angiogram was performed actually twice during this admission and no active extravasation was observed.  He had an EGD on 11/22 that was negative.  He had a Meckel scan on 11/24 that was negative.  He had a capsule study that was incomplete, report interprets red spots in small bowel as blood refluxed up from colon.  Follow-up with GI  Acute blood loss anemia Hemorrhagic shock Patient transfused 9 units PRBCs this admission.  In the last 72 hours, his hemoglobin has remained stable, he has had several bowel and bowel movements.  Discharge on oral iron, encouraged close PCP follow-up  Thrombocytopenia Resolved   Diabetes The patient is concerned that his Ozempic  led to constipation, which preceded his diverticular hemorrhage.  Encouraged him to follow-up with  PCP.  Chronic hepatitis B Right upper quadrant ultrasound unremarkable - Follow-up LFTs with PCP           The [place]  Controlled Substances Registry was reviewed for this patient prior to discharge.  Consultants: Critical care Gastroenterology, Dr. San   Disposition: Home Diet recommendation:  Discharge Diet Orders (From admission, onward)     Start     Ordered   07/05/24 0000  Diet - low sodium heart healthy        07/05/24 1052             DISCHARGE MEDICATION: Allergies as of 07/05/2024   No Known Allergies      Medication List     PAUSE taking these medications    Jardiance  10 MG Tabs tablet Wait to take this until your doctor or other care provider tells you to start again. Generic drug: empagliflozin  TAKE 1 TABLET BY MOUTH DAILY BEFORE BREAKFAST.   Ozempic  (0.25 or 0.5 MG/DOSE) 2 MG/3ML Sopn Wait to take this until your doctor or other care provider tells you to start again. Generic drug: Semaglutide (0.25 or 0.5MG /DOS) Inject 0.25 mg once a week for the first 4 weeks and then increase to 0.5 mg weekly.       STOP taking these medications    aspirin  EC 81 MG tablet   sildenafil  100 MG tablet Commonly known as: Viagra        TAKE these medications    Accu-Chek Guide Test test strip Generic  drug: glucose blood 1 EACH BY IN VITRO ROUTE IN THE MORNING, AT NOON, AND AT BEDTIME. MAY SUBSTITUTE TO ANY MANUFACTURER COVERED BY PATIENT'S INSURANCE.   Accu-Chek Softclix Lancets lancets USE AS DIRECTED 4 TIMES A DAY   acetaminophen  650 MG CR tablet Commonly known as: TYLENOL  Take 650 mg by mouth every 8 (eight) hours as needed for pain.   cetirizine  10 MG tablet Commonly known as: ZYRTEC  Take 1 tablet (10 mg total) by mouth daily.   ferrous sulfate  325 (65 FE) MG tablet Take 1 tablet (325 mg total) by mouth every other day. Start taking on: July 06, 2024   multivitamin with minerals Tabs tablet Take 1 tablet by mouth  daily. Centrum        Follow-up Information     Theophilus Andrews, Tully GRADE, MD. Schedule an appointment as soon as possible for a visit in 1 week(s).   Specialty: Internal Medicine Contact information: [address] [place] [address] [phone]         San Sandor GAILS, DO. Schedule an appointment as soon as possible for a visit.   Specialty: Gastroenterology Contact information: [address] [place] [address] [phone]                 Discharge Instructions     Diet - low sodium heart healthy   Complete by: As directed    Discharge instructions   Complete by: As directed    **IMPORTANT DISCHARGE INSTRUCTIONS**   From Dr. Jonel: You were admitted for gastrointestinal bleeding  Here, you had a CT angiogram twice that showed no definitive cause for the bleeding.  You also had a colonoscopy and capsule endoscopy and a Meckel's scan, that did not show the cause.  Follow up with [facility] (See below next to Dr. Rennis name)  Call them to arrange a follow up appointment  Eat a soft diet for the next week, then resume a normal diet, high in fiber  Go see your primary doctor in 1 week Have her check your blood levels and repeat your iron levels in several weeks  Take oral iron supplements ferrous sulfate  325 mg every other day  STOP aspirin   For now, hold the Ozempic  and Jardiance . Drink plenty of fluids Take it easy for the next few days.   Increase activity slowly   Complete by: As directed        Discharge Exam: Filed Weights   06/29/24 1036  Weight: 81.6 kg    General: Pt is alert, awake, not in acute distress Cardiovascular: RRR, nl S1-S2, no murmurs appreciated.   No LE edema.   Respiratory: Normal respiratory rate and rhythm.  CTAB without rales or wheezes. Abdominal: Abdomen soft and non-tender.  No distension or HSM.   Neuro/Psych: Strength symmetric in upper and lower extremities.   Judgment and insight appear normal.   Condition at discharge: good  The results of significant diagnostics from this hospitalization (including imaging, microbiology, ancillary and laboratory) are listed below for reference.   Imaging Studies: US  Abdomen Limited RUQ (LIVER/GB) Result Date: 07/03/2024 EXAM: Right Upper Quadrant Abdominal Ultrasound 07/03/2024 06:58:00 PM TECHNIQUE: Real-time ultrasonography of the right upper quadrant of the abdomen was performed. COMPARISON: US  Abdomen Limited 06/25/2019. CLINICAL HISTORY: Hepatitis B. FINDINGS: LIVER: The liver demonstrates normal echogenicity. No intrahepatic biliary ductal dilatation. No evidence of mass. BILIARY SYSTEM: Small amount of sludge within the gallbladder. No pericholecystic fluid. No cholelithiasis. The common bile duct measures 3.1 mm.  OTHER: No right upper quadrant ascites. Small right pleural effusion. IMPRESSION: 1. Small amount of sludge within the gallbladder. 2. Small right pleural effusion. Electronically signed by: Franky Crease MD 07/03/2024 08:43 PM EST RP Workstation: [id number]   NM Bowel Img Meckels Result Date: 07/02/2024 EXAM: MECKEL SCAN 07/02/2024 06:15:30 PM TECHNIQUE: RADIOPHARMACEUTICAL: 11.1 mCi Tc-65m pertechnetate. Dynamic planar imaging of the abdomen is obtained for 60 minutes following injection. COMPARISON: CT 06/29/2024. CLINICAL HISTORY: [id number] Acute GI bleeding [id number] Acute GI bleeding FINDINGS: There is physiologic activity in the stomach. Small amount of activity accumulates in the urinary bladder. No abnormal uptake to localize a Meckel's diverticulum containing gastric mucosa. IMPRESSION: 1. No evidence of Meckel's diverticulum containing gastric mucosa. Electronically signed by: Franky Crease MD 07/02/2024 07:13 PM EST RP Workstation: [id number]   CT Angio Abd/Pel w/ and/or w/o Result Date: 06/29/2024 EXAM: CTA ABDOMEN AND PELVIS WITHOUT AND WITH CONTRAST 06/29/2024 12:52:21 AM TECHNIQUE: CTA images of  the abdomen and pelvis without and with intravenous contrast. 75 mL of iohexol  (OMNIPAQUE ) 350 MG/ML injection was administered. Three-dimensional MIP/volume rendered formations were performed. Automated exposure control, iterative reconstruction, and/or weight based adjustment of the mA/kV was utilized to reduce the radiation dose to as low as reasonably achievable. COMPARISON: 06/28/2024 CLINICAL HISTORY: Lower GI bleed FINDINGS: VASCULATURE: AORTA: No acute finding. No abdominal aortic aneurysm. No dissection. CELIAC TRUNK: No acute finding. No occlusion or significant stenosis. SUPERIOR MESENTERIC ARTERY: No acute finding. No occlusion or significant stenosis. RENAL ARTERIES: No acute finding. No occlusion or significant stenosis. ILIAC ARTERIES: No acute finding. No occlusion or significant stenosis. LIVER: The liver is unremarkable. GALLBLADDER AND BILE DUCTS: Gallbladder is unremarkable. No biliary ductal dilatation. SPLEEN: The spleen is unremarkable. PANCREAS: The pancreas is unremarkable. ADRENAL GLANDS: Bilateral adrenal glands demonstrate no acute abnormality. KIDNEYS, URETERS AND BLADDER: No stones in the kidneys or ureters. No hydronephrosis. No perinephric or periureteral stranding. Urinary bladder is unremarkable. GI AND BOWEL: Interval fluid distention of the stomach. Mild pancolonic diverticulosis without superimposed acute inflammatory change. No active gastrointestinal hemorrhage. The stomach, small bowel, and large bowel are otherwise unremarkable. Appendix absent. There is no bowel obstruction. No abnormal bowel wall thickening or distension. REPRODUCTIVE: Moderate prostatic hypertrophy. PERITONEUM AND RETRPERITONEUM: No ascites or free air. LUNG BASE: No acute abnormality. LYMPH NODES: No lymphadenopathy. BONES AND SOFT TISSUES: T10 superior endplate fracture again noted. No acute soft tissue abnormality. IMPRESSION: 1. No active gastrointestinal hemorrhage. 2. Mild pancolonic diverticulosis  without superimposed acute inflammatory change. 3. Interval fluid distention of the stomach, nonspecific. 4. Moderate prostatic hypertrophy. 5. Unchanged T10 superior endplate fracture. Electronically signed by: Dorethia Molt MD 06/29/2024 01:26 AM EST RP Workstation: [id number]   CT Angio Abd/Pel W and/or Wo Contrast Result Date: 06/28/2024 CLINICAL DATA:  Possible lower GI bleed. Dark red blood in stool since this morning. Five episodes. Dizziness. EXAM: CTA ABDOMEN AND PELVIS WITHOUT AND WITH CONTRAST TECHNIQUE: Multidetector CT imaging of the abdomen and pelvis was performed using the standard protocol during bolus administration of intravenous contrast. Multiplanar reconstructed images and MIPs were obtained and reviewed to evaluate the vascular anatomy. RADIATION DOSE REDUCTION: This exam was performed according to the departmental dose-optimization program which includes automated exposure control, adjustment of the mA and/or kV according to patient size and/or use of iterative reconstruction technique. CONTRAST:  85mL OMNIPAQUE  IOHEXOL  350 MG/ML SOLN COMPARISON:  11/11/2021 FINDINGS: VASCULAR Aorta: Normal caliber aorta without aneurysm, dissection, vasculitis or significant stenosis. Celiac: Patent without evidence of aneurysm, dissection, vasculitis or significant  stenosis. SMA: Patent without evidence of aneurysm, dissection, vasculitis or significant stenosis. Renals: Both renal arteries are patent without evidence of aneurysm, dissection, vasculitis, fibromuscular dysplasia or significant stenosis. IMA: Patent without evidence of aneurysm, dissection, vasculitis or significant stenosis. Inflow: Patent without evidence of aneurysm, dissection, vasculitis or significant stenosis. Proximal Outflow: Bilateral common femoral and visualized portions of the superficial and profunda femoral arteries are patent without evidence of aneurysm, dissection, vasculitis or significant stenosis. Veins: No obvious  venous abnormality within the limitations of this arterial phase study. Review of the MIP images confirms the above findings. NON-VASCULAR Lower chest: Heart is normal size.  Visualized lung bases are clear. Hepatobiliary: Liver, gallbladder and biliary tree are normal. Pancreas: Normal. Spleen: Normal. Adrenals/Urinary Tract: Adrenal glands are normal. Kidneys are normal in size without hydronephrosis or nephrolithiasis. Kidneys are otherwise unchanged. Ureters and bladder are normal. Stomach/Bowel: Stomach and small bowel are normal. Appendix is normal. Minimal diverticulosis of the colon. Lymphatic: No adenopathy. Reproductive: Prostate is unremarkable. Other: No significant free peritoneal fluid or focal inflammatory change. Musculoskeletal: Minimal loss of mid vertebral body height of T10 with small associated Schmorl's node new since the prior exam. IMPRESSION: 1. No acute findings in the abdomen/pelvis. No evidence of active GI bleed. 2. Minimal colonic diverticulosis. Electronically Signed   By: Toribio Agreste M.D.   On: 06/28/2024 13:59    Microbiology: Results for orders placed or performed during the hospital encounter of 06/28/24  MRSA Next Gen by PCR, Nasal     Status: None   Collection Time: 06/29/24  3:51 PM   Specimen: Nasal Mucosa; Nasal Swab  Result Value Ref Range Status   MRSA by PCR Next Gen NOT DETECTED NOT DETECTED Final    Comment: (NOTE) The GeneXpert MRSA Assay (FDA approved for NASAL specimens only), is one component of a comprehensive MRSA colonization surveillance program. It is not intended to diagnose MRSA infection nor to guide or monitor treatment for MRSA infections. Test performance is not FDA approved in patients less than 78 years old. Performed at [facility], [address] [place], [place] [address]     Labs: CBC: Recent Labs  Lab 06/30/24 0148 06/30/24 0423 07/01/24 0600 07/01/24 1743 07/02/24 0440 07/02/24 0634 07/02/24 1837  07/03/24 0505 07/04/24 0320 07/05/24 0340  WBC 14.0*  --  17.9*  --  9.4  --   --  7.2 6.1 5.7  NEUTROABS 9.6*  --  12.3*  --  5.5  --   --  4.4 3.8  --   HGB 7.5*   < > 8.0*   < > 6.8*  --  8.7* 8.7* 8.3* 8.7*  HCT 22.2*   < > 22.0*   < > 19.3*  --  24.3* 25.0* 24.7* 26.0*  MCV 92.1  --  83.3  --  87.3  --   --  86.5 88.5 88.4  PLT 100*  --  71*  --  87* 90*  --  88* 143* 188   < > = values in this interval not displayed.   Basic Metabolic Panel: Recent Labs  Lab 06/30/24 0148 06/30/24 1635 07/01/24 0600 07/02/24 0440 07/02/24 2105 07/03/24 0505 07/04/24 0320  NA 141 139 136 139  --  140 143  K 3.5 4.3 3.9 3.1* 3.9 3.6 3.7  CL 113* 104 107 114*  --  110 110  CO2 22  --  20* 21*  --  24 25  GLUCOSE 122* 281* 183* 98  --  109* 110*  BUN 9 11 12  6*  --  <5* <5*  CREATININE 0.86 0.90 1.07 0.66  --  0.89 0.80  CALCIUM  7.4*  --  7.0* 6.4*  --  7.9* 8.5*  MG 1.6*  --  1.8 1.6*  --  1.9 1.8  PHOS  --   --   --   --   --  2.5 4.3   Liver Function Tests: Recent Labs  Lab 06/29/24 0405  AST 18  ALT 14  ALKPHOS 45  BILITOT 0.7  PROT 4.6*  ALBUMIN  2.5*   CBG: Recent Labs  Lab 07/04/24 1100 07/04/24 1625 07/04/24 2018 07/05/24 0736 07/05/24 1129  GLUCAP 240* 211* 203* 189* 224*    Discharge time spent: approximately 45 minutes spent on discharge counseling, evaluation of patient on day of discharge, and coordination of discharge planning with nursing, social work, pharmacy and case management  Signed: Lonni SHAUNNA Dalton, MD [facility] 07/05/2024

## 2024-07-05 NOTE — Progress Notes (Signed)
 DISCHARGE NOTE HOME Christian Vance Hollering to be discharged Home per MD order. Discussed prescriptions and follow up appointments with the patient. Prescriptions given to patient; iron is OTC.  medication list explained in detail. Patient verbalized understanding.  Skin clean, dry and intact without evidence of skin break down, no evidence of skin tears noted. IV catheter discontinued intact. Site without signs and symptoms of complications. Dressing and pressure applied. Pt denies pain at the site currently. No complaints noted.  Patient free of lines, drains, and wounds.   An After Visit Summary (AVS) was printed and given to the patient. Patient escorted via wheelchair, and discharged home via private auto.  Aviah Sorci A Proctor-Gann, RN

## 2024-07-05 NOTE — Plan of Care (Signed)
   Problem: Health Behavior/Discharge Planning: Goal: Ability to manage health-related needs will improve Outcome: Completed/Met

## 2024-07-09 ENCOUNTER — Telehealth: Payer: Self-pay

## 2024-07-09 ENCOUNTER — Ambulatory Visit: Payer: Self-pay | Admitting: Gastroenterology

## 2024-07-09 ENCOUNTER — Telehealth: Payer: Self-pay | Admitting: *Deleted

## 2024-07-09 NOTE — Telephone Encounter (Signed)
 Copied from CRM [id number]. Topic: Clinical - Prescription Issue >> Jul 09, 2024  9:37 AM Anairis L wrote: Reason for CRM: Patient was advised to stop all medication when discharge from [facility] hospital 07/05/2024, he has a f-up with us  on 07/11/2024 wife want to confirm that is ok.  Please call, thank you.

## 2024-07-09 NOTE — Telephone Encounter (Signed)
 Patient is aware.

## 2024-07-09 NOTE — Transitions of Care (Post Inpatient/ED Visit) (Signed)
 07/09/2024  Name: Christian Vance MRN: [id number] DOB: [birth year]  Today's TOC FU Call Status: Today's TOC FU Call Status:: Successful TOC FU Call Completed TOC FU Call Complete Date: 07/09/24  Patient's Name and Date of Birth confirmed. Name, DOB  Transition Care Management Follow-up Telephone Call Date of Discharge: 07/05/24 Discharge Facility: [facility]) Type of Discharge: Inpatient Admission Primary Inpatient Discharge Diagnosis:: Gastrointestinal hemorrhage How have you been since you were released from the hospital?: Better Any questions or concerns?: Yes Patient Questions/Concerns:: patient wants to know if ozempic  is going to be changed to another medication- hospital advised it be changed due to the GI bleeding and he discontinue it until follow up with PCP. Patient would also like a prescription sent to pharmacy for glucose meter. Patient Questions/Concerns Addressed: Notified Provider of Patient Questions/Concerns  Items Reviewed: Did you receive and understand the discharge instructions provided?: Yes Medications obtained,verified, and reconciled?: Yes (Medications Reviewed) Any new allergies since your discharge?: No Dietary orders reviewed?: NA Do you have support at home?: Yes People in Home [RPT]: spouse  Medications Reviewed Today: Medications Reviewed Today     Reviewed by Lang Avelina PARAS, CMA (Certified Medical Assistant) on 07/09/24 at 1023  Med List Status: <None>   Medication Order Taking? Sig Documenting Provider Last Dose Status Informant  ACCU-CHEK GUIDE TEST test strip [id number]  1 EACH BY IN VITRO ROUTE IN THE MORNING, AT NOON, AND AT BEDTIME. MAY SUBSTITUTE TO ANY MANUFACTURER COVERED BY PATIENT'S INSURANCE.  Patient not taking: Reported on 07/09/2024   Theophilus Andrews, Tully GRADE, MD  Active Spouse/Significant Other  Accu-Chek Softclix Lancets lancets [id number]  USE AS DIRECTED 4 TIMES A DAY  Patient not taking: Reported on 07/09/2024    Theophilus Andrews, Tully GRADE, MD  Active Spouse/Significant Other  acetaminophen  (TYLENOL ) 650 MG CR tablet [id number]  Take 650 mg by mouth every 8 (eight) hours as needed for pain.  Patient not taking: Reported on 07/09/2024   [provider]  Active Spouse/Significant Other  cetirizine  (ZYRTEC ) 10 MG tablet [id number]  Take 1 tablet (10 mg total) by mouth daily.  Patient not taking: Reported on 07/09/2024   Theophilus Andrews, Tully GRADE, MD  Active Spouse/Significant Other  ferrous sulfate  325 (65 FE) MG tablet [id number] Yes Take 1 tablet (325 mg total) by mouth every other day. Jonel Lonni SQUIBB, MD  Active   JARDIANCE  10 MG TABS tablet [id number]  TAKE 1 TABLET BY MOUTH DAILY BEFORE BREAKFAST.  Patient not taking: Reported on 07/09/2024   Theophilus Andrews, Tully GRADE, MD  Active Spouse/Significant Other  Multiple Vitamin (MULTIVITAMIN WITH MINERALS) TABS tablet [id number]  Take 1 tablet by mouth daily. Centrum  Patient not taking: Reported on 07/09/2024   [provider]  Active Spouse/Significant Other  Semaglutide ,0.25 or 0.5MG /DOS, (OZEMPIC , 0.25 OR 0.5 MG/DOSE,) 2 MG/3ML SOPN [id number]  Inject 0.25 mg once a week for the first 4 weeks and then increase to 0.5 mg weekly.  Patient not taking: Reported on 07/09/2024   Theophilus Andrews, Tully GRADE, MD  Active Spouse/Significant Other           Med Note ([place], TEENA BRAVO   Fri Jun 29, 2024  2:27 PM) Take on Mondays            Home Care and Equipment/Supplies: Were Home Health Services Ordered?: NA Any new equipment or medical supplies ordered?: NA  Functional Questionnaire: Do you need assistance with bathing/showering or dressing?: No Do you need assistance with meal  preparation?: No Do you need assistance with eating?: No Do you have difficulty maintaining continence: No Do you need assistance with getting out of bed/getting out of a chair/moving?: No Do you have difficulty managing or taking your medications?:  No  Follow up appointments reviewed: PCP Follow-up appointment confirmed?: Yes Date of PCP follow-up appointment?: 07/11/24 Follow-up Provider: Dr. Theophilus [facility] Follow-up appointment confirmed?: Yes Date of Specialist follow-up appointment?:  (pt is uncertain of date, he wife set it up) Follow-Up Specialty Provider:: GI Dr. San Do you need transportation to your follow-up appointment?: No Do you understand care options if your condition(s) worsen?: Yes-patient verbalized understanding    SIGNATURE Avelina Essex, CMA (AAMA)  [facility] [phone]

## 2024-07-11 ENCOUNTER — Ambulatory Visit: Admitting: Internal Medicine

## 2024-07-11 ENCOUNTER — Encounter: Payer: Self-pay | Admitting: Internal Medicine

## 2024-07-11 VITALS — BP 120/70 | HR 94 | Temp 98.3°F | Wt 177.6 lb

## 2024-07-11 DIAGNOSIS — E1169 Type 2 diabetes mellitus with other specified complication: Secondary | ICD-10-CM

## 2024-07-11 DIAGNOSIS — K922 Gastrointestinal hemorrhage, unspecified: Secondary | ICD-10-CM

## 2024-07-11 DIAGNOSIS — D62 Acute posthemorrhagic anemia: Secondary | ICD-10-CM

## 2024-07-11 DIAGNOSIS — K5731 Diverticulosis of large intestine without perforation or abscess with bleeding: Secondary | ICD-10-CM

## 2024-07-11 DIAGNOSIS — D649 Anemia, unspecified: Secondary | ICD-10-CM

## 2024-07-11 DIAGNOSIS — Z09 Encounter for follow-up examination after completed treatment for conditions other than malignant neoplasm: Secondary | ICD-10-CM

## 2024-07-11 LAB — POCT GLYCOSYLATED HEMOGLOBIN (HGB A1C): Hemoglobin A1C: 6.3 % — AB (ref 4.0–5.6)

## 2024-07-11 NOTE — Progress Notes (Signed)
 Hospital follow-up visit     CC/Reason for Visit: Hospitalization follow-up  HPI: Christian Vance is a 63 y.o. male who is coming in today for the above mentioned reasons, specifically transitional care services face-to-face visit.    Dates hospitalized: 06/28/24-07/05/24 Days since discharge from hospital: 6 Patient was discharged from the hospital to: home Reason for admission to hospital: GI Bleed  Date of interactive phone contact with patient and/or caregiver: 07/09/24  I have reviewed in detail patient's discharge summary plus pertinent specific notes, labs, and images from the hospitalization.  Yes  Presented to the hospital with significant BRBPR. Was found to be in hemorrhagic shock with ABLA. Was transfused a total of 9 units of PRBCs. Was in the ICU. Had EGD/colonoscopy/capsule endoscopy and CT angiogram without source of bleeding identified. Presumed diverticular. Since DC home has had no further bloody stools. Has been taking iron supplementation and feels more energetic.  Medication reconciliation was done today and patient is taking meds as recommended by discharging hospitalist/specialist. Yes  He was advised to discontinue BP and diabetic medications during his hospital admission. He is wary about restarting Ozempic  as he believes this contributed to his GI bleed.   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  followed by [facility] ID service.      Past Surgical History:  Procedure Laterality Date   BIOPSY  01/19/2020   Procedure: BIOPSY;  Surgeon: Aneita Gwendlyn DASEN, MD;  Location: [facility] ENDOSCOPY;  Service: Endoscopy;;   COLONOSCOPY N/A 06/29/2024   Procedure: COLONOSCOPY;  Surgeon: San Sandor GAILS, DO;  Location: [facility] ENDOSCOPY;  Service: Gastroenterology;  Laterality: N/A;   COLONOSCOPY N/A 06/30/2024   Procedure:  COLONOSCOPY;  Surgeon: San Sandor GAILS, DO;  Location: [facility] ENDOSCOPY;  Service: Gastroenterology;  Laterality: N/A;   COLONOSCOPY WITH PROPOFOL  N/A 01/19/2020   Procedure: COLONOSCOPY WITH PROPOFOL ;  Surgeon: Aneita Gwendlyn DASEN, MD;  Location: [facility] ENDOSCOPY;  Service: Endoscopy;  Laterality: N/A;   ESOPHAGOGASTRODUODENOSCOPY N/A 06/30/2024   Procedure: EGD (ESOPHAGOGASTRODUODENOSCOPY);  Surgeon: San Sandor GAILS, DO;  Location: [facility] ENDOSCOPY;  Service: Gastroenterology;  Laterality: N/A;   ESOPHAGOGASTRODUODENOSCOPY (EGD) WITH PROPOFOL  N/A 01/19/2020   Procedure: ESOPHAGOGASTRODUODENOSCOPY (EGD) WITH PROPOFOL ;  Surgeon: Aneita Gwendlyn DASEN, MD;  Location: [facility] ENDOSCOPY;  Service: Endoscopy;  Laterality: N/A;   HEMOSTASIS CLIP PLACEMENT  06/29/2024   Procedure: CONTROL OF HEMORRHAGE, GI TRACT, ENDOSCOPIC, BY CLIPPING OR OVERSEWING;  Surgeon: San Sandor GAILS, DO;  Location: [facility] ENDOSCOPY;  Service: Gastroenterology;;   HOT HEMOSTASIS N/A 01/19/2020   Procedure: HOT HEMOSTASIS (ARGON PLASMA COAGULATION/BICAP);  Surgeon: Aneita Gwendlyn DASEN, MD;  Location: [facility] ENDOSCOPY;  Service: Endoscopy;  Laterality: N/A;   ORIF ANKLE FRACTURE Right 11/13/2021   Procedure: OPEN REDUCTION INTERNAL FIXATION (ORIF) ANKLE FRACTURE;  Surgeon: Kendal Franky SQUIBB, MD;  Location: [facility] OR;  Service: Orthopedics;  Laterality: Right;   ORIF CLAVICULAR FRACTURE Left 11/13/2021   Procedure: OPEN REDUCTION INTERNAL FIXATION (ORIF) CLAVICULAR FRACTURE;  Surgeon: Kendal Franky SQUIBB, MD;  Location: [facility] OR;  Service: Orthopedics;  Laterality: Left;   POLYPECTOMY  01/19/2020   Procedure: POLYPECTOMY;  Surgeon: Aneita Gwendlyn DASEN, MD;  Location: [facility] ENDOSCOPY;  Service: Endoscopy;;   SMART PILL PROCEDURE  06/30/2024   Procedure: CAPSULE ENDOSCOPY, USING SMARTPILL MOTILITY TESTING SYSTEM;  Surgeon: San Sandor GAILS, DO;  Location: [facility] ENDOSCOPY;  Service:  Gastroenterology;;    Social History:  reports that he has never smoked. He has never used smokeless  tobacco. He reports that he does not drink alcohol and does not use drugs.  Allergies: No Known Allergies  Family History:  Family History  Problem Relation Age of Onset   Colon cancer Neg Hx      Current Outpatient Medications:    ACCU-CHEK GUIDE TEST test strip, 1 EACH BY IN VITRO ROUTE IN THE MORNING, AT NOON, AND AT BEDTIME. MAY SUBSTITUTE TO ANY MANUFACTURER COVERED BY PATIENT'S INSURANCE. (Patient not taking: Reported on 07/11/2024), Disp: 100 strip, Rfl: 2   Accu-Chek Softclix Lancets lancets, USE AS DIRECTED 4 TIMES A DAY (Patient not taking: Reported on 07/11/2024), Disp: 100 each, Rfl: 3   acetaminophen  (TYLENOL ) 650 MG CR tablet, Take 650 mg by mouth every 8 (eight) hours as needed for pain. (Patient not taking: Reported on 07/11/2024), Disp: , Rfl:    cetirizine  (ZYRTEC ) 10 MG tablet, Take 1 tablet (10 mg total) by mouth daily. (Patient not taking: Reported on 07/11/2024), Disp: 90 tablet, Rfl: 1   ferrous sulfate  325 (65 FE) MG tablet, Take 1 tablet (325 mg total) by mouth every other day. (Patient not taking: Reported on 07/11/2024), Disp: , Rfl:    [Paused] JARDIANCE  10 MG TABS tablet, TAKE 1 TABLET BY MOUTH DAILY BEFORE BREAKFAST. (Patient not taking: Reported on 07/11/2024), Disp: 90 tablet, Rfl: 1   Multiple Vitamin (MULTIVITAMIN WITH MINERALS) TABS tablet, Take 1 tablet by mouth daily. Centrum (Patient not taking: Reported on 07/11/2024), Disp: , Rfl:    [Paused] Semaglutide ,0.25 or 0.5MG /DOS, (OZEMPIC , 0.25 OR 0.5 MG/DOSE,) 2 MG/3ML SOPN, Inject 0.25 mg once a week for the first 4 weeks and then increase to 0.5 mg weekly. (Patient not taking: Reported on 07/11/2024), Disp: 3 mL, Rfl: 2  Review of Systems:  Negative except as mentioned in HPI.    Physical Exam: Vitals:   07/11/24 1526  BP: 120/70  Pulse: 94  Temp: 98.3 F (36.8 C)  TempSrc: Oral  SpO2: 98%  Weight: 177 lb 9.6 oz (80.6 kg)    Body mass index is 25.48 kg/m.   Physical Exam Vitals reviewed.   Constitutional:      Appearance: Normal appearance.  HENT:     Head: Normocephalic and atraumatic.  Eyes:     Conjunctiva/sclera: Conjunctivae normal.  Cardiovascular:     Rate and Rhythm: Normal rate and regular rhythm.  Pulmonary:     Effort: Pulmonary effort is normal.     Breath sounds: Normal breath sounds.  Skin:    General: Skin is warm and dry.  Neurological:     General: No focal deficit present.     Mental Status: He is alert and oriented to person, place, and time.  Psychiatric:        Mood and Affect: Mood normal.        Behavior: Behavior normal.        Thought Content: Thought content normal.        Judgment: Judgment normal.     Impression and Plan:  Hospital discharge follow-up  Type 2 diabetes mellitus with other specified complication, without long-term current use of insulin  (HCC) - Plan: POC HgB A1c  Gastrointestinal hemorrhage, unspecified gastrointestinal hemorrhage type  Diverticulosis of colon with hemorrhage of large intestine  Acute blood loss anemia - Plan: CBC with Differential/Platelet, CBC with Differential/Platelet  Symptomatic anemia  -Extensive hospital charts reviewed in detail. -recheck CBC to ensure no  further decrease. -Continue iron supplementation, recheck iron studies in 3 months. -Ok to stay off BP and DM meds for now. Will need to re-assess when he returns in 3 months.  Medical decision making of high complexity was utilized today.      Tully Theophilus Andrews, MD Trinidad Herlene

## 2024-07-12 ENCOUNTER — Ambulatory Visit: Payer: Self-pay | Admitting: Internal Medicine

## 2024-07-12 LAB — CBC WITH DIFFERENTIAL/PLATELET
Basophils Absolute: 0.1 K/uL (ref 0.0–0.1)
Basophils Relative: 1 % (ref 0.0–3.0)
Eosinophils Absolute: 0.2 K/uL (ref 0.0–0.7)
Eosinophils Relative: 2.9 % (ref 0.0–5.0)
HCT: 34.3 % — ABNORMAL LOW (ref 39.0–52.0)
Hemoglobin: 11.1 g/dL — ABNORMAL LOW (ref 13.0–17.0)
Lymphocytes Relative: 27.7 % (ref 12.0–46.0)
Lymphs Abs: 1.7 K/uL (ref 0.7–4.0)
MCHC: 32.4 g/dL (ref 30.0–36.0)
MCV: 91.4 fl (ref 78.0–100.0)
Monocytes Absolute: 0.4 K/uL (ref 0.1–1.0)
Monocytes Relative: 5.7 % (ref 3.0–12.0)
Neutro Abs: 3.9 K/uL (ref 1.4–7.7)
Neutrophils Relative %: 62.7 % (ref 43.0–77.0)
Platelets: 403 K/uL — ABNORMAL HIGH (ref 150.0–400.0)
RBC: 3.75 Mil/uL — ABNORMAL LOW (ref 4.22–5.81)
RDW: 19.2 % — ABNORMAL HIGH (ref 11.5–15.5)
WBC: 6.2 K/uL (ref 4.0–10.5)

## 2024-07-23 MED ORDER — ACCU-CHEK GUIDE W/DEVICE KIT: 1.0000 | PACK | Freq: Every day | 2 refills | Status: AC

## 2024-08-06 ENCOUNTER — Encounter: Payer: Self-pay | Admitting: Family Medicine

## 2024-08-06 ENCOUNTER — Ambulatory Visit: Admitting: Family Medicine

## 2024-08-06 VITALS — BP 120/74 | HR 68 | Temp 98.4°F | Ht 70.0 in | Wt 178.0 lb

## 2024-08-06 DIAGNOSIS — J988 Other specified respiratory disorders: Secondary | ICD-10-CM

## 2024-08-06 MED ORDER — BENZONATATE 200 MG PO CAPS: 200.0000 mg | ORAL_CAPSULE | Freq: Three times a day (TID) | ORAL | 0 refills | Status: AC | PRN

## 2024-08-06 MED ORDER — DOXYCYCLINE HYCLATE 100 MG PO CAPS: 100.0000 mg | ORAL_CAPSULE | Freq: Two times a day (BID) | ORAL | 0 refills | Status: AC

## 2024-08-06 NOTE — Patient Instructions (Signed)
-  It was a pleasure to care for you. -Prescribed Doxycyline 100mg  tablet, take 1 tablet every 12 hours for 7 days.  -Prescribed Benzonatate  200mg  tablet, take 1 tablet every 8 hours as needed for cough. -Rest, hydrate.  -Follow up if not improved.

## 2024-08-06 NOTE — Progress Notes (Signed)
 "  Acute Office Visit   Subjective:  Patient ID: Christian Vance, male    DOB: 1960-10-03, 63 y.o.   MRN: [id number]  Chief Complaint  Patient presents with   Cough    X1 week ongoing     HPI:  Patient is present for an acute visit. Patients primary care provider is Dr. Delma Nap and unable to see provider due to scheduling availability.  He is complaining of a cough. Started 5-10 days ago. Sometimes it is productive-thick, yellow.  Reports chest tightness from coughing severely. Had some nasal congestion initially.   Denies wheezing, fever, SHOB, headaches, ear pain/drainage, sinus pain or pressure.   He reports he has took Mucinex another OTC cough medication he does not recall the name with no relief.   Review of Systems  Respiratory:  Positive for cough.    See HPI above      Objective:   BP 120/74   Pulse 68   Temp 98.4 F (36.9 C) (Oral)   Ht 5' 10 (1.778 m)   Wt 178 lb (80.7 kg)   SpO2 98%   BMI 25.54 kg/m    Physical Exam Vitals reviewed.  Constitutional:      General: He is not in acute distress.    Appearance: Normal appearance. He is not ill-appearing, toxic-appearing or diaphoretic.  HENT:     Head: Normocephalic and atraumatic.     Right Ear: Tympanic membrane, ear canal and external ear normal. There is no impacted cerumen.     Left Ear: Tympanic membrane, ear canal and external ear normal. There is no impacted cerumen.     Nose:     Right Sinus: No maxillary sinus tenderness or frontal sinus tenderness.     Left Sinus: No maxillary sinus tenderness or frontal sinus tenderness.     Mouth/Throat:     Pharynx: Oropharynx is clear. No pharyngeal swelling, oropharyngeal exudate, posterior oropharyngeal erythema or uvula swelling.  Eyes:     General:        Right eye: No discharge.        Left eye: No discharge.     Conjunctiva/sclera: Conjunctivae normal.  Cardiovascular:     Rate and Rhythm: Normal rate and regular rhythm.     Heart sounds:  Normal heart sounds. No murmur heard.    No friction rub. No gallop.  Pulmonary:     Effort: Pulmonary effort is normal. No respiratory distress.     Breath sounds: Decreased air movement (Posterior, lower) present. No stridor. No wheezing or rhonchi.  Musculoskeletal:        General: Normal range of motion.  Lymphadenopathy:     Head:     Right side of head: No submental or submandibular adenopathy.     Left side of head: No submental or submandibular adenopathy.  Skin:    General: Skin is warm and dry.  Neurological:     General: No focal deficit present.     Mental Status: He is alert and oriented to person, place, and time. Mental status is at baseline.  Psychiatric:        Mood and Affect: Mood normal.        Behavior: Behavior normal.        Thought Content: Thought content normal.        Judgment: Judgment normal.       Assessment & Plan:  Respiratory tract infection -     Doxycycline  Hyclate; Take 1 capsule (100 mg total)  by mouth 2 (two) times daily for 7 days.  Dispense: 14 capsule; Refill: 0 -     Benzonatate ; Take 1 capsule (200 mg total) by mouth 3 (three) times daily as needed for up to 7 days for cough.  Dispense: 20 capsule; Refill: 0  -Prescribed Doxycyline 100mg  tablet, take 1 tablet every 12 hours for 7 days for possibly respiratory infection. Suspect that he may have started with a viral infection that has lead to a secondary infection.  -Prescribed Benzonatate  200mg  tablet, take 1 tablet every 8 hours as needed for cough. -Rest, hydrate.  -Follow up if not improved.   Verlon Carcione, NP "

## 2024-08-23 ENCOUNTER — Ambulatory Visit: Admitting: Gastroenterology
